# Patient Record
Sex: Female | Born: 1955
Health system: Southern US, Community
[De-identification: ages and names within clinical notes are randomized; demographics above are authoritative.]

## PROBLEM LIST (undated history)

## (undated) DIAGNOSIS — E559 Vitamin D deficiency, unspecified: Secondary | ICD-10-CM

## (undated) DIAGNOSIS — M8580 Other specified disorders of bone density and structure, unspecified site: Secondary | ICD-10-CM

## (undated) DIAGNOSIS — C801 Malignant (primary) neoplasm, unspecified: Secondary | ICD-10-CM

## (undated) DIAGNOSIS — I10 Essential (primary) hypertension: Secondary | ICD-10-CM

## (undated) DIAGNOSIS — D693 Immune thrombocytopenic purpura: Secondary | ICD-10-CM

## (undated) DIAGNOSIS — F419 Anxiety disorder, unspecified: Secondary | ICD-10-CM

## (undated) DIAGNOSIS — D126 Benign neoplasm of colon, unspecified: Secondary | ICD-10-CM

## (undated) HISTORY — DX: Benign neoplasm of colon, unspecified: D12.6

## (undated) HISTORY — PX: TUBAL LIGATION: SHX77

## (undated) HISTORY — DX: Essential (primary) hypertension: I10

## (undated) HISTORY — DX: Malignant (primary) neoplasm, unspecified: C80.1

## (undated) HISTORY — DX: Other specified disorders of bone density and structure, unspecified site: M85.80

## (undated) HISTORY — DX: Immune thrombocytopenic purpura: D69.3

## (undated) HISTORY — PX: APPENDECTOMY: SHX54

## (undated) HISTORY — DX: Anxiety disorder, unspecified: F41.9

## (undated) HISTORY — PX: OTHER SURGICAL HISTORY: SHX169

## (undated) HISTORY — DX: Vitamin D deficiency, unspecified: E55.9

---

## 1995-10-17 HISTORY — PX: ABDOMINAL HYSTERECTOMY: SHX81

## 1995-10-17 HISTORY — PX: TOTAL VAGINAL HYSTERECTOMY: SHX2548

## 1996-10-16 DIAGNOSIS — C801 Malignant (primary) neoplasm, unspecified: Secondary | ICD-10-CM

## 1996-10-16 HISTORY — DX: Malignant (primary) neoplasm, unspecified: C80.1

## 1997-12-07 ENCOUNTER — Ambulatory Visit (HOSPITAL_COMMUNITY): Admission: RE | Admit: 1997-12-07 | Discharge: 1997-12-07 | Payer: Self-pay | Admitting: Obstetrics and Gynecology

## 1998-05-04 ENCOUNTER — Ambulatory Visit (HOSPITAL_COMMUNITY): Admission: RE | Admit: 1998-05-04 | Discharge: 1998-05-04 | Payer: Self-pay | Admitting: Internal Medicine

## 1998-10-18 ENCOUNTER — Other Ambulatory Visit: Admission: RE | Admit: 1998-10-18 | Discharge: 1998-10-18 | Payer: Self-pay | Admitting: Obstetrics and Gynecology

## 1999-01-05 ENCOUNTER — Ambulatory Visit (HOSPITAL_COMMUNITY): Admission: RE | Admit: 1999-01-05 | Discharge: 1999-01-05 | Payer: Self-pay | Admitting: Obstetrics and Gynecology

## 1999-01-05 ENCOUNTER — Encounter: Payer: Self-pay | Admitting: Obstetrics and Gynecology

## 1999-07-14 ENCOUNTER — Ambulatory Visit (HOSPITAL_COMMUNITY): Admission: RE | Admit: 1999-07-14 | Discharge: 1999-07-14 | Payer: Self-pay | Admitting: *Deleted

## 1999-12-30 ENCOUNTER — Other Ambulatory Visit: Admission: RE | Admit: 1999-12-30 | Discharge: 1999-12-30 | Payer: Self-pay | Admitting: Obstetrics and Gynecology

## 2000-01-16 ENCOUNTER — Encounter: Payer: Self-pay | Admitting: Obstetrics and Gynecology

## 2000-01-16 ENCOUNTER — Ambulatory Visit (HOSPITAL_COMMUNITY): Admission: RE | Admit: 2000-01-16 | Discharge: 2000-01-16 | Payer: Self-pay | Admitting: Obstetrics and Gynecology

## 2000-01-25 ENCOUNTER — Other Ambulatory Visit: Admission: RE | Admit: 2000-01-25 | Discharge: 2000-01-25 | Payer: Self-pay | Admitting: Obstetrics and Gynecology

## 2000-05-18 ENCOUNTER — Encounter: Admission: RE | Admit: 2000-05-18 | Discharge: 2000-05-18 | Payer: Self-pay | Admitting: *Deleted

## 2001-01-01 ENCOUNTER — Other Ambulatory Visit: Admission: RE | Admit: 2001-01-01 | Discharge: 2001-01-01 | Payer: Self-pay | Admitting: Obstetrics and Gynecology

## 2001-01-18 ENCOUNTER — Encounter: Payer: Self-pay | Admitting: Obstetrics and Gynecology

## 2001-01-18 ENCOUNTER — Ambulatory Visit (HOSPITAL_COMMUNITY): Admission: RE | Admit: 2001-01-18 | Discharge: 2001-01-18 | Payer: Self-pay | Admitting: Obstetrics and Gynecology

## 2002-09-26 ENCOUNTER — Encounter: Admission: RE | Admit: 2002-09-26 | Discharge: 2002-09-26 | Payer: Self-pay | Admitting: Internal Medicine

## 2002-09-26 ENCOUNTER — Encounter: Payer: Self-pay | Admitting: Internal Medicine

## 2002-11-12 ENCOUNTER — Emergency Department (HOSPITAL_COMMUNITY): Admission: EM | Admit: 2002-11-12 | Discharge: 2002-11-12 | Payer: Self-pay | Admitting: Emergency Medicine

## 2002-11-12 ENCOUNTER — Encounter: Payer: Self-pay | Admitting: Emergency Medicine

## 2002-12-16 ENCOUNTER — Encounter: Payer: Self-pay | Admitting: Obstetrics and Gynecology

## 2002-12-16 ENCOUNTER — Ambulatory Visit (HOSPITAL_COMMUNITY): Admission: RE | Admit: 2002-12-16 | Discharge: 2002-12-16 | Payer: Self-pay | Admitting: Obstetrics and Gynecology

## 2002-12-24 ENCOUNTER — Other Ambulatory Visit: Admission: RE | Admit: 2002-12-24 | Discharge: 2002-12-24 | Payer: Self-pay | Admitting: Obstetrics and Gynecology

## 2003-10-05 ENCOUNTER — Emergency Department (HOSPITAL_COMMUNITY): Admission: EM | Admit: 2003-10-05 | Discharge: 2003-10-05 | Payer: Self-pay | Admitting: Emergency Medicine

## 2003-12-22 ENCOUNTER — Ambulatory Visit (HOSPITAL_COMMUNITY): Admission: RE | Admit: 2003-12-22 | Discharge: 2003-12-22 | Payer: Self-pay | Admitting: Obstetrics and Gynecology

## 2003-12-25 ENCOUNTER — Other Ambulatory Visit: Admission: RE | Admit: 2003-12-25 | Discharge: 2003-12-25 | Payer: Self-pay | Admitting: Obstetrics and Gynecology

## 2004-12-23 ENCOUNTER — Ambulatory Visit (HOSPITAL_COMMUNITY): Admission: RE | Admit: 2004-12-23 | Discharge: 2004-12-23 | Payer: Self-pay | Admitting: Obstetrics and Gynecology

## 2005-01-12 ENCOUNTER — Other Ambulatory Visit: Admission: RE | Admit: 2005-01-12 | Discharge: 2005-01-12 | Payer: Self-pay | Admitting: Addiction Medicine

## 2005-12-29 ENCOUNTER — Ambulatory Visit (HOSPITAL_COMMUNITY): Admission: RE | Admit: 2005-12-29 | Discharge: 2005-12-29 | Payer: Self-pay | Admitting: Obstetrics and Gynecology

## 2006-01-26 ENCOUNTER — Other Ambulatory Visit: Admission: RE | Admit: 2006-01-26 | Discharge: 2006-01-26 | Payer: Self-pay | Admitting: Obstetrics and Gynecology

## 2007-01-08 ENCOUNTER — Ambulatory Visit (HOSPITAL_COMMUNITY): Admission: RE | Admit: 2007-01-08 | Discharge: 2007-01-08 | Payer: Self-pay | Admitting: Obstetrics and Gynecology

## 2007-02-01 ENCOUNTER — Other Ambulatory Visit: Admission: RE | Admit: 2007-02-01 | Discharge: 2007-02-01 | Payer: Self-pay | Admitting: Obstetrics and Gynecology

## 2008-01-17 ENCOUNTER — Ambulatory Visit (HOSPITAL_COMMUNITY): Admission: RE | Admit: 2008-01-17 | Discharge: 2008-01-17 | Payer: Self-pay | Admitting: Obstetrics and Gynecology

## 2008-01-27 ENCOUNTER — Other Ambulatory Visit: Admission: RE | Admit: 2008-01-27 | Discharge: 2008-01-27 | Payer: Self-pay | Admitting: Obstetrics and Gynecology

## 2008-11-16 DIAGNOSIS — D126 Benign neoplasm of colon, unspecified: Secondary | ICD-10-CM

## 2008-11-16 HISTORY — DX: Benign neoplasm of colon, unspecified: D12.6

## 2008-12-03 DIAGNOSIS — D126 Benign neoplasm of colon, unspecified: Secondary | ICD-10-CM | POA: Insufficient documentation

## 2009-02-05 ENCOUNTER — Encounter: Admission: RE | Admit: 2009-02-05 | Discharge: 2009-02-05 | Payer: Self-pay | Admitting: Obstetrics and Gynecology

## 2010-11-26 ENCOUNTER — Emergency Department (HOSPITAL_COMMUNITY)
Admission: EM | Admit: 2010-11-26 | Discharge: 2010-11-26 | Disposition: A | Discharge status: Home / Self Care | Payer: Managed Care, Other (non HMO) | Attending: Emergency Medicine | Admitting: Emergency Medicine

## 2010-11-26 ENCOUNTER — Emergency Department (HOSPITAL_COMMUNITY): Payer: Managed Care, Other (non HMO)

## 2010-11-26 DIAGNOSIS — M549 Dorsalgia, unspecified: Secondary | ICD-10-CM | POA: Insufficient documentation

## 2010-11-26 DIAGNOSIS — I10 Essential (primary) hypertension: Secondary | ICD-10-CM | POA: Insufficient documentation

## 2010-11-26 DIAGNOSIS — Z85820 Personal history of malignant melanoma of skin: Secondary | ICD-10-CM | POA: Insufficient documentation

## 2010-11-26 DIAGNOSIS — R079 Chest pain, unspecified: Secondary | ICD-10-CM | POA: Insufficient documentation

## 2012-09-03 ENCOUNTER — Other Ambulatory Visit: Payer: Self-pay | Admitting: Obstetrics and Gynecology

## 2012-09-03 DIAGNOSIS — Z1231 Encounter for screening mammogram for malignant neoplasm of breast: Secondary | ICD-10-CM

## 2012-09-27 ENCOUNTER — Ambulatory Visit (HOSPITAL_COMMUNITY)
Admission: RE | Admit: 2012-09-27 | Discharge: 2012-09-27 | Disposition: A | Discharge status: Home / Self Care | Payer: Managed Care, Other (non HMO) | Source: Ambulatory Visit | Attending: Obstetrics and Gynecology | Admitting: Obstetrics and Gynecology

## 2012-09-27 DIAGNOSIS — Z1231 Encounter for screening mammogram for malignant neoplasm of breast: Secondary | ICD-10-CM | POA: Insufficient documentation

## 2012-10-01 ENCOUNTER — Encounter: Payer: Self-pay | Admitting: Gynecology

## 2012-10-01 DIAGNOSIS — N809 Endometriosis, unspecified: Secondary | ICD-10-CM | POA: Insufficient documentation

## 2012-10-01 DIAGNOSIS — C4372 Malignant melanoma of left lower limb, including hip: Secondary | ICD-10-CM | POA: Insufficient documentation

## 2012-10-10 ENCOUNTER — Ambulatory Visit (INDEPENDENT_AMBULATORY_CARE_PROVIDER_SITE_OTHER): Payer: Managed Care, Other (non HMO) | Admitting: Obstetrics and Gynecology

## 2012-10-10 ENCOUNTER — Encounter: Payer: Self-pay | Admitting: Obstetrics and Gynecology

## 2012-10-10 VITALS — BP 134/84 | Ht 66.0 in | Wt 199.0 lb

## 2012-10-10 DIAGNOSIS — Z23 Encounter for immunization: Secondary | ICD-10-CM

## 2012-10-10 DIAGNOSIS — M8580 Other specified disorders of bone density and structure, unspecified site: Secondary | ICD-10-CM

## 2012-10-10 DIAGNOSIS — M899 Disorder of bone, unspecified: Secondary | ICD-10-CM

## 2012-10-10 DIAGNOSIS — I10 Essential (primary) hypertension: Secondary | ICD-10-CM | POA: Insufficient documentation

## 2012-10-10 DIAGNOSIS — Z01419 Encounter for gynecological examination (general) (routine) without abnormal findings: Secondary | ICD-10-CM

## 2012-10-10 DIAGNOSIS — M949 Disorder of cartilage, unspecified: Secondary | ICD-10-CM

## 2012-10-10 MED ORDER — HYDROCORTISONE ACETATE 25 MG RE SUPP: 25.0000 mg | Freq: Two times a day (BID) | RECTAL | Status: DC

## 2012-10-10 NOTE — Progress Notes (Signed)
Patient came to see me today for her annual GYN exam. She is a gravida 2 para 2 Ab0. This was a new GYN visit because we had not seen her for 4-1/2 years.  In 1997 she had a total abdominal hysterectomy and appendectomy for fibroids and endometriosis. In 1984 she had cryosurgery for cervical dysplasia which was CIN-1. She has not had abnormal Pap smears since then. Her last Pap smear was 2010. She is not sexually active. She has had trouble with hemorrhoids. She had 2 thrombosed hemorrhoids lanced. She uses Anusol HC Suppository's for discomfort from hemorrhoids. She has been aware for years of something on her left labia. She had a normal mammogram this year. Her last bone density was 2008. She had osteopenia. She did not have an elevated fracture risk. She does her lab through her PCP. She is doing well without hormone replacement therapy. She was previously on Vagifem but is not having intercourse and is okay without it. She is having no vaginal bleeding. She is having no pelvic pain.  HEENT: Within normal limits.Kennon Portela present. Neck: No masses. Supraclavicular lymph nodes: Not enlarged. Breasts: Examined in both sitting and lying position. Symmetrical without skin changes or masses. Abdomen: Soft no masses guarding or rebound. No hernias. Pelvic: External within normal limits Except for varicosity of left labia. BUS within normal limits. Vaginal examination shows poorestrogen effect, no cystocele enterocele or rectocele. Cervix and uterus absent. Adnexa within normal limits. Rectovaginal confirmatory.Hemorrhoidal tag present at 3 o'clock. Extremities within normal limits.  Assessment: #1. Dilated varicose vein of left labia. #2 osteopenia. #3 atrophic vaginitis. #4 hemorrhoids  Plan: Continue colonoscopies. Continue yearly mammograms. Continue Anusol HC-prescription written. Schedule  bone density. Reassured about varicose vein of labia.Pap not done.The new Pap smear guidelines were discussed  with the patient.

## 2012-10-10 NOTE — Patient Instructions (Signed)
Schedule bone density.    

## 2012-10-10 NOTE — Addendum Note (Signed)
Addended by: Dayna Barker on: 10/10/2012 04:00 PM   Modules accepted: Orders

## 2012-10-11 LAB — URINALYSIS W MICROSCOPIC + REFLEX CULTURE
Glucose, UA: NEGATIVE mg/dL
Leukocytes, UA: NEGATIVE
Protein, ur: NEGATIVE mg/dL
Squamous Epithelial / LPF: NONE SEEN
pH: 5.5 (ref 5.0–8.0)

## 2012-10-16 DIAGNOSIS — E559 Vitamin D deficiency, unspecified: Secondary | ICD-10-CM

## 2012-10-16 DIAGNOSIS — M8580 Other specified disorders of bone density and structure, unspecified site: Secondary | ICD-10-CM | POA: Insufficient documentation

## 2012-10-16 HISTORY — DX: Other specified disorders of bone density and structure, unspecified site: M85.80

## 2012-10-16 HISTORY — DX: Vitamin D deficiency, unspecified: E55.9

## 2012-11-12 ENCOUNTER — Other Ambulatory Visit: Payer: Self-pay | Admitting: Gynecology

## 2012-11-12 ENCOUNTER — Encounter: Payer: Self-pay | Admitting: Gynecology

## 2012-11-12 ENCOUNTER — Ambulatory Visit (INDEPENDENT_AMBULATORY_CARE_PROVIDER_SITE_OTHER): Payer: Managed Care, Other (non HMO)

## 2012-11-12 DIAGNOSIS — M949 Disorder of cartilage, unspecified: Secondary | ICD-10-CM

## 2012-11-12 DIAGNOSIS — M899 Disorder of bone, unspecified: Secondary | ICD-10-CM

## 2012-11-12 DIAGNOSIS — M8580 Other specified disorders of bone density and structure, unspecified site: Secondary | ICD-10-CM

## 2012-11-14 ENCOUNTER — Other Ambulatory Visit: Payer: Self-pay | Admitting: *Deleted

## 2012-11-14 DIAGNOSIS — M899 Disorder of bone, unspecified: Secondary | ICD-10-CM

## 2012-11-14 DIAGNOSIS — M949 Disorder of cartilage, unspecified: Secondary | ICD-10-CM

## 2012-11-15 ENCOUNTER — Other Ambulatory Visit: Payer: Managed Care, Other (non HMO)

## 2012-11-15 DIAGNOSIS — M899 Disorder of bone, unspecified: Secondary | ICD-10-CM

## 2012-11-18 ENCOUNTER — Other Ambulatory Visit: Payer: Self-pay | Admitting: Gynecology

## 2012-11-18 ENCOUNTER — Encounter: Payer: Self-pay | Admitting: Gynecology

## 2012-11-18 DIAGNOSIS — E559 Vitamin D deficiency, unspecified: Secondary | ICD-10-CM

## 2012-11-18 LAB — PARATHYROID HORMONE, INTACT (NO CA): PTH: 61 pg/mL (ref 14.0–72.0)

## 2012-11-18 MED ORDER — VITAMIN D3 1.25 MG (50000 UT) PO CAPS: 1.0000 | ORAL_CAPSULE | ORAL | Status: DC

## 2013-02-14 ENCOUNTER — Other Ambulatory Visit: Payer: Managed Care, Other (non HMO)

## 2013-02-14 DIAGNOSIS — E559 Vitamin D deficiency, unspecified: Secondary | ICD-10-CM

## 2013-02-17 ENCOUNTER — Other Ambulatory Visit: Payer: Self-pay | Admitting: Gynecology

## 2013-02-17 DIAGNOSIS — E559 Vitamin D deficiency, unspecified: Secondary | ICD-10-CM

## 2013-02-17 MED ORDER — ERGOCALCIFEROL 1.25 MG (50000 UT) PO CAPS: 50000.0000 [IU] | ORAL_CAPSULE | ORAL | Status: DC

## 2013-03-18 DIAGNOSIS — I10 Essential (primary) hypertension: Secondary | ICD-10-CM | POA: Insufficient documentation

## 2013-04-11 ENCOUNTER — Other Ambulatory Visit: Payer: Self-pay | Admitting: Gynecology

## 2013-04-25 ENCOUNTER — Other Ambulatory Visit: Payer: Managed Care, Other (non HMO)

## 2013-04-25 DIAGNOSIS — E559 Vitamin D deficiency, unspecified: Secondary | ICD-10-CM

## 2013-04-26 LAB — VITAMIN D 25 HYDROXY (VIT D DEFICIENCY, FRACTURES): Vit D, 25-Hydroxy: 28 ng/mL — ABNORMAL LOW (ref 30–89)

## 2013-04-27 ENCOUNTER — Emergency Department (HOSPITAL_COMMUNITY)
Admission: EM | Admit: 2013-04-27 | Discharge: 2013-04-28 | Disposition: A | Discharge status: Home / Self Care | Payer: Managed Care, Other (non HMO) | Attending: Emergency Medicine | Admitting: Emergency Medicine

## 2013-04-27 ENCOUNTER — Emergency Department (HOSPITAL_COMMUNITY): Payer: Managed Care, Other (non HMO)

## 2013-04-27 ENCOUNTER — Encounter (HOSPITAL_COMMUNITY): Payer: Self-pay | Admitting: Emergency Medicine

## 2013-04-27 DIAGNOSIS — Y9389 Activity, other specified: Secondary | ICD-10-CM | POA: Insufficient documentation

## 2013-04-27 DIAGNOSIS — I10 Essential (primary) hypertension: Secondary | ICD-10-CM | POA: Insufficient documentation

## 2013-04-27 DIAGNOSIS — S92402A Displaced unspecified fracture of left great toe, initial encounter for closed fracture: Secondary | ICD-10-CM

## 2013-04-27 DIAGNOSIS — R209 Unspecified disturbances of skin sensation: Secondary | ICD-10-CM | POA: Insufficient documentation

## 2013-04-27 DIAGNOSIS — S92919A Unspecified fracture of unspecified toe(s), initial encounter for closed fracture: Secondary | ICD-10-CM | POA: Insufficient documentation

## 2013-04-27 DIAGNOSIS — Z8639 Personal history of other endocrine, nutritional and metabolic disease: Secondary | ICD-10-CM | POA: Insufficient documentation

## 2013-04-27 DIAGNOSIS — Z8742 Personal history of other diseases of the female genital tract: Secondary | ICD-10-CM | POA: Insufficient documentation

## 2013-04-27 DIAGNOSIS — Z862 Personal history of diseases of the blood and blood-forming organs and certain disorders involving the immune mechanism: Secondary | ICD-10-CM | POA: Insufficient documentation

## 2013-04-27 DIAGNOSIS — Z8739 Personal history of other diseases of the musculoskeletal system and connective tissue: Secondary | ICD-10-CM | POA: Insufficient documentation

## 2013-04-27 DIAGNOSIS — Z85828 Personal history of other malignant neoplasm of skin: Secondary | ICD-10-CM | POA: Insufficient documentation

## 2013-04-27 DIAGNOSIS — Y929 Unspecified place or not applicable: Secondary | ICD-10-CM | POA: Insufficient documentation

## 2013-04-27 DIAGNOSIS — W208XXA Other cause of strike by thrown, projected or falling object, initial encounter: Secondary | ICD-10-CM | POA: Insufficient documentation

## 2013-04-27 DIAGNOSIS — Z79899 Other long term (current) drug therapy: Secondary | ICD-10-CM | POA: Insufficient documentation

## 2013-04-27 MED ORDER — HYDROCODONE-ACETAMINOPHEN 5-325 MG PO TABS: 1.0000 | ORAL_TABLET | ORAL | Status: DC | PRN

## 2013-04-27 MED ORDER — IBUPROFEN 800 MG PO TABS
800.0000 mg | ORAL_TABLET | Freq: Once | ORAL | Status: AC
  Administered 2013-04-27: 800 mg via ORAL
  Filled 2013-04-27: qty 1

## 2013-04-27 MED ORDER — IBUPROFEN 800 MG PO TABS: 800.0000 mg | ORAL_TABLET | Freq: Three times a day (TID) | ORAL | Status: DC

## 2013-04-27 NOTE — ED Notes (Signed)
Pt states that she was disassembling a crib and a large piece fell on her left great toe. Toe is bruised and swollen.

## 2013-04-27 NOTE — ED Provider Notes (Signed)
History  This chart was scribed for non-physician practitioner working with Gerhard Munch, MD by Greggory Stallion, ED scribe. This patient was seen in room WTR9/WTR9 and the patient's care was started at 10:19 PM.  CSN: [id number] Arrival date & time 04/27/13  2026   Chief Complaint  Patient presents with  . Toe Injury   The history is provided by medical records and the patient. No language interpreter was used.    HPI Comments: Carol Thomas is a 57 y.o. female who presents to the Emergency Department complaining of left 1st toe injury that happened about 6 hours ago. Pt states she was disassembling a crib and a large piece fell on her toe. She states the pain is worsened by touch or bearing weight. Pt states she has some numbness around her toe. Pt states she took ibuprofen with some relief, but has not tried other over-the-counter treatments. Pt denies any other associated symptoms including weakness. Patient has not attempted to ambulate since the injury.  Past Medical History  Diagnosis Date  . Cancer 1998    Melanoma-Left leg  . Fibroid   . Endometriosis   . Hypertension   . Osteopenia 10/2012    T score of -2.2 FRAX 8%/1.1%  . Vitamin D deficiency 10/2012    vitamin D level 10   Past Surgical History  Procedure Laterality Date  . Abdominal hysterectomy  1997    TAH  . Tubal ligation    . Appendectomy    . Melanoma excised     Family History  Problem Relation Age of Onset  . Hypertension Mother   . Diabetes Mother   . Osteoporosis Mother   . Thyroid disease Mother    History  Substance Use Topics  . Smoking status: Never Smoker   . Smokeless tobacco: Never Used  . Alcohol Use: No   OB History   Grav Para Term Preterm Abortions TAB SAB Ect Mult Living   2 2 2       2      Review of Systems  HENT: Negative for neck pain and neck stiffness.   Musculoskeletal: Positive for joint swelling and arthralgias. Negative for back pain.  Skin: Negative for wound.   Neurological: Positive for numbness.  All other systems reviewed and are negative.    Allergies  Review of patient's allergies indicates no known allergies.  Home Medications   Current Outpatient Rx  Name  Route  Sig  Dispense  Refill  . amLODipine (NORVASC) 10 MG tablet   Oral   Take 10 mg by mouth daily.         . citalopram (CELEXA) 40 MG tablet   Oral   Take 20 mg by mouth daily.          . hydrocortisone (ANUSOL-HC) 25 MG suppository   Rectal   Place 25 mg rectally 2 (two) times daily as needed for hemorrhoids.         Marland Kitchen ibuprofen (ADVIL,MOTRIN) 200 MG tablet   Oral   Take 600-800 mg by mouth every 6 (six) hours as needed for pain.         Marland Kitchen losartan-hydrochlorothiazide (HYZAAR) 100-25 MG per tablet   Oral   Take 1 tablet by mouth daily.         Marland Kitchen HYDROcodone-acetaminophen (NORCO/VICODIN) 5-325 MG per tablet   Oral   Take 1 tablet by mouth every 4 (four) hours as needed for pain.   6 tablet   0   .  ibuprofen (ADVIL,MOTRIN) 800 MG tablet   Oral   Take 1 tablet (800 mg total) by mouth 3 (three) times daily.   21 tablet   0    BP 163/92  Pulse 62  Temp(Src) 98.3 F (36.8 C) (Oral)  Resp 18  Ht 5\' 6"  (1.676 m)  Wt 203 lb (92.08 kg)  BMI 32.78 kg/m2  SpO2 95%  Physical Exam  Nursing note and vitals reviewed. Constitutional: She appears well-developed and well-nourished. No distress.  Awake, alert, nontoxic appearance  HENT:  Head: Normocephalic and atraumatic.  Mouth/Throat: Oropharynx is clear and moist. No oropharyngeal exudate.  Eyes: Conjunctivae are normal. Pupils are equal, round, and reactive to light. No scleral icterus.  Neck: Normal range of motion. Neck supple.  Cardiovascular: Normal rate, regular rhythm, normal heart sounds and intact distal pulses.   No murmur heard. Capillary refill less than 3 the left lower extremity  Pulmonary/Chest: Effort normal and breath sounds normal. No respiratory distress. She has no wheezes.   Musculoskeletal: Normal range of motion. She exhibits no edema.  Swelling and ecchymosis to left great toe. Subungual hematoma noted along with onychomycosis. Left great toe is Tender to palpation with Decreased ROM. Full ROM of left ankle and left knee.   Neurological: She is alert.  Speech is clear and goal oriented Moves extremities without ataxia Sensation intact to the left lower extremity including the left great toe Strength 5 out of 5 the left lower extremity including plantar flexion and dorsiflexion, decreased strength in the left great toe secondary to severe pain   Skin: Skin is warm and dry. She is not diaphoretic.  Psychiatric: She has a normal mood and affect.    ED Course  Procedures (including critical care time)  DIAGNOSTIC STUDIES: Oxygen Saturation is 95% on RA, adequate by my interpretation.    COORDINATION OF CARE: 11:26 PM-Discussed treatment plan which includes buddy taping, post-op shoe, and crutches with pt at bedside and pt agreed to plan. Advised pt to follow up with orthopaedics.    Labs Reviewed - No data to display Dg Toe Great Left  04/27/2013   *RADIOLOGY REPORT*  Clinical Data: Trauma.  Pain.  LEFT GREAT TOE  Comparison: None.  Findings: Oblique slightly displaced fracture of the left first toe distal phalanx.  IMPRESSION: Oblique slightly displaced fracture of the left first toe distal phalanx.   Original Report Authenticated By: Lacy Duverney, M.D.   1. Fractured great toe, left, closed, initial encounter     MDM  Carol Thomas presents with injury to the left great toe.  Patient X-Ray with oblique, slightly displaced fracture of the distal phalanx of the left first toe. I personally reviewed the imaging tests through PACS system.  I reviewed available ER/hospitalization records through the EMR.  Buddy tape placed and patient placed in post-op shoe. She ambulates without difficulty in a postop shoe. Pain managed in ED. Pt advised to follow up with  orthopedics for further evaluation as needed. Conservative therapy recommended and discussed. Patient will be dc home & is agreeable with above plan.  I have also discussed reasons to return immediately to the ER.  Patient expresses understanding and agrees with plan.  I personally performed the services described in this documentation, which was scribed in my presence. The recorded information has been reviewed and is accurate.    Dahlia Client Tziporah Knoke, PA-C 04/28/13 [phone]

## 2013-04-29 ENCOUNTER — Other Ambulatory Visit: Payer: Self-pay | Admitting: Gynecology

## 2013-04-29 DIAGNOSIS — E559 Vitamin D deficiency, unspecified: Secondary | ICD-10-CM

## 2013-04-30 NOTE — ED Provider Notes (Signed)
  Medical screening examination/treatment/procedure(s) were performed by non-physician practitioner and as supervising physician I was immediately available for consultation/collaboration.    Alyx Gee, MD 04/30/13 1114 

## 2013-06-13 ENCOUNTER — Other Ambulatory Visit: Payer: Self-pay | Admitting: Gynecology

## 2013-08-16 ENCOUNTER — Other Ambulatory Visit: Payer: Self-pay | Admitting: Gynecology

## 2013-08-18 NOTE — Telephone Encounter (Signed)
rx denied because pt is due to have Vit. D rechecked.

## 2013-08-21 ENCOUNTER — Other Ambulatory Visit: Payer: Self-pay

## 2014-01-08 ENCOUNTER — Telehealth: Payer: Self-pay | Admitting: *Deleted

## 2014-01-08 MED ORDER — HYDROCORTISONE ACETATE 25 MG RE SUPP: 25.0000 mg | Freq: Two times a day (BID) | RECTAL | Status: DC | PRN

## 2014-01-08 NOTE — Telephone Encounter (Signed)
rx sent

## 2014-01-08 NOTE — Telephone Encounter (Signed)
Pt has annual scheduled with you on 02/27/14, requesting refill on Anusol-HC supporsitory due to painful hemorrhoids. Last seen in Dec.2013 please advise

## 2014-01-08 NOTE — Telephone Encounter (Signed)
Okay to refill? 

## 2014-02-27 ENCOUNTER — Ambulatory Visit (INDEPENDENT_AMBULATORY_CARE_PROVIDER_SITE_OTHER): Payer: Managed Care, Other (non HMO) | Admitting: Gynecology

## 2014-02-27 ENCOUNTER — Other Ambulatory Visit (HOSPITAL_COMMUNITY)
Admission: RE | Admit: 2014-02-27 | Discharge: 2014-02-27 | Disposition: A | Discharge status: Home / Self Care | Payer: Managed Care, Other (non HMO) | Source: Ambulatory Visit | Attending: Gynecology | Admitting: Gynecology

## 2014-02-27 ENCOUNTER — Encounter: Payer: Self-pay | Admitting: Gynecology

## 2014-02-27 VITALS — BP 124/80 | Ht 66.0 in | Wt 202.0 lb

## 2014-02-27 DIAGNOSIS — M949 Disorder of cartilage, unspecified: Secondary | ICD-10-CM

## 2014-02-27 DIAGNOSIS — Z01419 Encounter for gynecological examination (general) (routine) without abnormal findings: Secondary | ICD-10-CM

## 2014-02-27 DIAGNOSIS — K649 Unspecified hemorrhoids: Secondary | ICD-10-CM

## 2014-02-27 DIAGNOSIS — M899 Disorder of bone, unspecified: Secondary | ICD-10-CM

## 2014-02-27 DIAGNOSIS — M8580 Other specified disorders of bone density and structure, unspecified site: Secondary | ICD-10-CM

## 2014-02-27 MED ORDER — HYDROCORTISONE ACETATE 25 MG RE SUPP: 25.0000 mg | Freq: Two times a day (BID) | RECTAL | Status: DC | PRN

## 2014-02-27 NOTE — Patient Instructions (Signed)
Followup for annual exam in one year, sooner if any issues.  You may obtain a copy of any labs that were done today by logging onto MyChart as outlined in the instructions provided with your AVS (after visit summary). The office will not call with normal lab results but certainly if there are any significant abnormalities then we will contact you.   Health Maintenance, Female A healthy lifestyle and preventative care can promote health and wellness.  Maintain regular health, dental, and eye exams.  Eat a healthy diet. Foods like vegetables, fruits, whole grains, low-fat dairy products, and lean protein foods contain the nutrients you need without too many calories. Decrease your intake of foods high in solid fats, added sugars, and salt. Get information about a proper diet from your caregiver, if necessary.  Regular physical exercise is one of the most important things you can do for your health. Most adults should get at least 150 minutes of moderate-intensity exercise (any activity that increases your heart rate and causes you to sweat) each week. In addition, most adults need muscle-strengthening exercises on 2 or more days a week.   Maintain a healthy weight. The body mass index (BMI) is a screening tool to identify possible weight problems. It provides an estimate of body fat based on height and weight. Your caregiver can help determine your BMI, and can help you achieve or maintain a healthy weight. For adults 20 years and older:  A BMI below 18.5 is considered underweight.  A BMI of 18.5 to 24.9 is normal.  A BMI of 25 to 29.9 is considered overweight.  A BMI of 30 and above is considered obese.  Maintain normal blood lipids and cholesterol by exercising and minimizing your intake of saturated fat. Eat a balanced diet with plenty of fruits and vegetables. Blood tests for lipids and cholesterol should begin at age 15 and be repeated every 5 years. If your lipid or cholesterol levels are  high, you are over 50, or you are a high risk for heart disease, you may need your cholesterol levels checked more frequently.Ongoing high lipid and cholesterol levels should be treated with medicines if diet and exercise are not effective.  If you smoke, find out from your caregiver how to quit. If you do not use tobacco, do not start.  Lung cancer screening is recommended for adults aged 45 80 years who are at high risk for developing lung cancer because of a history of smoking. Yearly low-dose computed tomography (CT) is recommended for people who have at least a 30-pack-year history of smoking and are a current smoker or have quit within the past 15 years. A pack year of smoking is smoking an average of 1 pack of cigarettes a day for 1 year (for example: 1 pack a day for 30 years or 2 packs a day for 15 years). Yearly screening should continue until the smoker has stopped smoking for at least 15 years. Yearly screening should also be stopped for people who develop a health problem that would prevent them from having lung cancer treatment.  If you are pregnant, do not drink alcohol. If you are breastfeeding, be very cautious about drinking alcohol. If you are not pregnant and choose to drink alcohol, do not exceed 1 drink per day. One drink is considered to be 12 ounces (355 mL) of beer, 5 ounces (148 mL) of wine, or 1.5 ounces (44 mL) of liquor.  Avoid use of street drugs. Do not share needles with  anyone. Ask for help if you need support or instructions about stopping the use of drugs.  High blood pressure causes heart disease and increases the risk of stroke. Blood pressure should be checked at least every 1 to 2 years. Ongoing high blood pressure should be treated with medicines, if weight loss and exercise are not effective.  If you are 55 to 57 years old, ask your caregiver if you should take aspirin to prevent strokes.  Diabetes screening involves taking a blood sample to check your fasting  blood sugar level. This should be done once every 3 years, after age 45, if you are within normal weight and without risk factors for diabetes. Testing should be considered at a younger age or be carried out more frequently if you are overweight and have at least 1 risk factor for diabetes.  Breast cancer screening is essential preventative care for women. You should practice "breast self-awareness." This means understanding the normal appearance and feel of your breasts and may include breast self-examination. Any changes detected, no matter how small, should be reported to a caregiver. Women in their 20s and 30s should have a clinical breast exam (CBE) by a caregiver as part of a regular health exam every 1 to 3 years. After age 40, women should have a CBE every year. Starting at age 40, women should consider having a mammogram (breast X-ray) every year. Women who have a family history of breast cancer should talk to their caregiver about genetic screening. Women at a high risk of breast cancer should talk to their caregiver about having an MRI and a mammogram every year.  Breast cancer gene (BRCA)-related cancer risk assessment is recommended for women who have family members with BRCA-related cancers. BRCA-related cancers include breast, ovarian, tubal, and peritoneal cancers. Having family members with these cancers may be associated with an increased risk for harmful changes (mutations) in the breast cancer genes BRCA1 and BRCA2. Results of the assessment will determine the need for genetic counseling and BRCA1 and BRCA2 testing.  The Pap test is a screening test for cervical cancer. Women should have a Pap test starting at age 21. Between ages 21 and 29, Pap tests should be repeated every 2 years. Beginning at age 30, you should have a Pap test every 3 years as long as the past 3 Pap tests have been normal. If you had a hysterectomy for a problem that was not cancer or a condition that could lead to  cancer, then you no longer need Pap tests. If you are between ages 65 and 70, and you have had normal Pap tests going back 10 years, you no longer need Pap tests. If you have had past treatment for cervical cancer or a condition that could lead to cancer, you need Pap tests and screening for cancer for at least 20 years after your treatment. If Pap tests have been discontinued, risk factors (such as a new sexual partner) need to be reassessed to determine if screening should be resumed. Some women have medical problems that increase the chance of getting cervical cancer. In these cases, your caregiver may recommend more frequent screening and Pap tests.  The human papillomavirus (HPV) test is an additional test that may be used for cervical cancer screening. The HPV test looks for the virus that can cause the cell changes on the cervix. The cells collected during the Pap test can be tested for HPV. The HPV test could be used to screen women aged 30   years and older, and should be used in women of any age who have unclear Pap test results. After the age of 30, women should have HPV testing at the same frequency as a Pap test.  Colorectal cancer can be detected and often prevented. Most routine colorectal cancer screening begins at the age of 50 and continues through age 75. However, your caregiver may recommend screening at an earlier age if you have risk factors for colon cancer. On a yearly basis, your caregiver may provide home test kits to check for hidden blood in the stool. Use of a small camera at the end of a tube, to directly examine the colon (sigmoidoscopy or colonoscopy), can detect the earliest forms of colorectal cancer. Talk to your caregiver about this at age 50, when routine screening begins. Direct examination of the colon should be repeated every 5 to 10 years through age 75, unless early forms of pre-cancerous polyps or small growths are found.  Hepatitis C blood testing is recommended for  all people born from 1945 through 1965 and any individual with known risks for hepatitis C.  Practice safe sex. Use condoms and avoid high-risk sexual practices to reduce the spread of sexually transmitted infections (STIs). Sexually active women aged 25 and younger should be checked for Chlamydia, which is a common sexually transmitted infection. Older women with new or multiple partners should also be tested for Chlamydia. Testing for other STIs is recommended if you are sexually active and at increased risk.  Osteoporosis is a disease in which the bones lose minerals and strength with aging. This can result in serious bone fractures. The risk of osteoporosis can be identified using a bone density scan. Women ages 65 and over and women at risk for fractures or osteoporosis should discuss screening with their caregivers. Ask your caregiver whether you should be taking a calcium supplement or vitamin D to reduce the rate of osteoporosis.  Menopause can be associated with physical symptoms and risks. Hormone replacement therapy is available to decrease symptoms and risks. You should talk to your caregiver about whether hormone replacement therapy is right for you.  Use sunscreen. Apply sunscreen liberally and repeatedly throughout the day. You should seek shade when your shadow is shorter than you. Protect yourself by wearing long sleeves, pants, a wide-brimmed hat, and sunglasses year round, whenever you are outdoors.  Notify your caregiver of new moles or changes in moles, especially if there is a change in shape or color. Also notify your caregiver if a mole is larger than the size of a pencil eraser.  Stay current with your immunizations. Document Released: 04/17/2011 Document Revised: 01/27/2013 Document Reviewed: 04/17/2011 [employer] Patient Information 2014 [employer].   

## 2014-02-27 NOTE — Progress Notes (Signed)
Carol Thomas 30-Sep-1956 [id number]        58 y.o.  G2P2002 for annual exam.  Doing well. Former patient of Dr. Cherylann Banas.  Past medical history,surgical history, problem list, medications, allergies, family history and social history were all reviewed and documented as reviewed in the EPIC chart.  ROS:  12 system ROS performed with pertinent positives and negatives included in the history, assessment and plan.  Included Systems: General, HEENT, Neck, Cardiovascular, Pulmonary, Gastrointestinal, Genitourinary, Musculoskeletal, Dermatologic, Endocrine, Hematological, Neurologic, Psychiatric Additional significant findings :  None   Exam: Kim assistant Filed Vitals:   02/27/14 1429  BP: 124/80  Height: 5\' 6"  (1.676 m)  Weight: 202 lb (91.627 kg)   General appearance:  Normal affect, orientation and appearance. Skin: Grossly normal HEENT: Without gross lesions.  No cervical or supraclavicular adenopathy. Thyroid normal.  Lungs:  Clear without wheezing, rales or rhonchi Cardiac: RR, without RMG Abdominal:  Soft, nontender, without masses, guarding, rebound, organomegaly or hernia Breasts:  Examined lying and sitting without masses, retractions, discharge or axillary adenopathy. Pelvic:  Ext/BUS/vagina with generalized atrophic changes  Adnexa  Without masses or tenderness    Anus and perineum  Normal with old external hemorrhoids  Rectovaginal  Normal sphincter tone without palpated masses or tenderness.    Assessment/Plan:  58 y.o. G64P2002 female for annual exam.   1. Status post TAH/appendectomy for leiomyoma and endometriosis 1997. Postmenopausal/atrophic genital changes. Patient doing well without significant hot flushes, night sweats, vaginal dryness, is not sexually active. Continue to monitor. 2. Pap smear 2009. Pap of cuff done today. History of CIN-1 status post cryosurgery 1984. Options to stop screaming altogether she is status post hysterectomy for benign indications versus  less frequent screening intervals reviewed. We'll readdress on an annual basis. 3. Mammography overdue and patient knows to schedule this. SBE monthly reviewed. 4. Osteopenia. Next a 10/2012 with T score -2.2 FRAX 8%/1.1%. Was noted to be vitamin D deficient with vitamin D level 10.  Was supplemented with 50,000 units weekly with followup vitamin D 28. Patient never continued supplementation afterwards. Check vitamin D today. Plan repeat DEXA next year at a two-year interval. 5. Colonoscopy 5 years ago and due to be repeated now she is in the process of scheduling this. 6. Hemorrhoids. Patient asked for refill of Anusol HC suppositories that she uses for intermittent hemorrhoid discomfort. Prescription written #12 with one refill 7. Health maintenance. No routine blood work done as she is going to have this done at her primary physician's office who she is in the process of setting up an appointment. Followup one year, sooner as needed.   Note: This document was prepared with digital dictation and possible smart phrase technology. Any transcriptional errors that result from this process are unintentional.   Anastasio Auerbach MD, 3:06 PM 02/27/2014

## 2014-02-28 LAB — URINALYSIS W MICROSCOPIC + REFLEX CULTURE
BACTERIA UA: NONE SEEN
BILIRUBIN URINE: NEGATIVE
CASTS: NONE SEEN
CRYSTALS: NONE SEEN
Glucose, UA: NEGATIVE mg/dL
Hgb urine dipstick: NEGATIVE
KETONES UR: NEGATIVE mg/dL
Leukocytes, UA: NEGATIVE
NITRITE: NEGATIVE
PH: 6 (ref 5.0–8.0)
Protein, ur: NEGATIVE mg/dL
SPECIFIC GRAVITY, URINE: 1.01 (ref 1.005–1.030)
Squamous Epithelial / LPF: NONE SEEN
Urobilinogen, UA: 0.2 mg/dL (ref 0.0–1.0)

## 2014-02-28 LAB — VITAMIN D 25 HYDROXY (VIT D DEFICIENCY, FRACTURES): Vit D, 25-Hydroxy: 21 ng/mL — ABNORMAL LOW (ref 30–89)

## 2014-03-03 ENCOUNTER — Other Ambulatory Visit: Payer: Self-pay | Admitting: Gynecology

## 2014-03-03 DIAGNOSIS — E559 Vitamin D deficiency, unspecified: Secondary | ICD-10-CM

## 2014-03-03 MED ORDER — VITAMIN D (ERGOCALCIFEROL) 1.25 MG (50000 UNIT) PO CAPS: 50000.0000 [IU] | ORAL_CAPSULE | ORAL | Status: DC

## 2014-05-29 ENCOUNTER — Telehealth: Payer: Self-pay | Admitting: *Deleted

## 2014-05-29 NOTE — Telephone Encounter (Signed)
(  pt aware you are out of the office) Pt called requesting refill on Celexa 20 mg pt PCP has retired. Pt said she spoke with you about this. Please advise

## 2014-05-31 NOTE — Telephone Encounter (Signed)
Ok to refill times one year

## 2014-06-01 ENCOUNTER — Other Ambulatory Visit: Payer: Self-pay | Admitting: Gynecology

## 2014-06-01 MED ORDER — CITALOPRAM HYDROBROMIDE 40 MG PO TABS: 20.0000 mg | ORAL_TABLET | Freq: Every day | ORAL | Status: DC

## 2014-06-01 NOTE — Telephone Encounter (Signed)
Rx sent. Patient informed. 

## 2014-08-17 ENCOUNTER — Encounter: Payer: Self-pay | Admitting: Gynecology

## 2014-08-19 ENCOUNTER — Encounter: Payer: Self-pay | Admitting: Family Medicine

## 2014-08-19 ENCOUNTER — Ambulatory Visit (INDEPENDENT_AMBULATORY_CARE_PROVIDER_SITE_OTHER): Payer: 59 | Admitting: Family Medicine

## 2014-08-19 VITALS — BP 138/82 | HR 68 | Ht 66.5 in | Wt 204.0 lb

## 2014-08-19 DIAGNOSIS — F419 Anxiety disorder, unspecified: Secondary | ICD-10-CM | POA: Insufficient documentation

## 2014-08-19 DIAGNOSIS — Z23 Encounter for immunization: Secondary | ICD-10-CM

## 2014-08-19 DIAGNOSIS — Z5181 Encounter for therapeutic drug level monitoring: Secondary | ICD-10-CM

## 2014-08-19 DIAGNOSIS — I10 Essential (primary) hypertension: Secondary | ICD-10-CM | POA: Diagnosis not present

## 2014-08-19 DIAGNOSIS — E782 Mixed hyperlipidemia: Secondary | ICD-10-CM | POA: Insufficient documentation

## 2014-08-19 DIAGNOSIS — E7800 Pure hypercholesterolemia, unspecified: Secondary | ICD-10-CM

## 2014-08-19 DIAGNOSIS — E559 Vitamin D deficiency, unspecified: Secondary | ICD-10-CM | POA: Diagnosis not present

## 2014-08-19 DIAGNOSIS — C439 Malignant melanoma of skin, unspecified: Secondary | ICD-10-CM | POA: Insufficient documentation

## 2014-08-19 DIAGNOSIS — M8580 Other specified disorders of bone density and structure, unspecified site: Secondary | ICD-10-CM | POA: Diagnosis not present

## 2014-08-19 DIAGNOSIS — J309 Allergic rhinitis, unspecified: Secondary | ICD-10-CM | POA: Insufficient documentation

## 2014-08-19 DIAGNOSIS — D126 Benign neoplasm of colon, unspecified: Secondary | ICD-10-CM

## 2014-08-19 DIAGNOSIS — K644 Residual hemorrhoidal skin tags: Secondary | ICD-10-CM | POA: Insufficient documentation

## 2014-08-19 DIAGNOSIS — E785 Hyperlipidemia, unspecified: Secondary | ICD-10-CM | POA: Insufficient documentation

## 2014-08-19 DIAGNOSIS — K319 Disease of stomach and duodenum, unspecified: Secondary | ICD-10-CM | POA: Insufficient documentation

## 2014-08-19 MED ORDER — LOSARTAN POTASSIUM-HCTZ 100-25 MG PO TABS: 1.0000 | ORAL_TABLET | Freq: Every day | ORAL | Status: DC

## 2014-08-19 MED ORDER — AMLODIPINE BESYLATE 10 MG PO TABS: 10.0000 mg | ORAL_TABLET | Freq: Every day | ORAL | Status: DC

## 2014-08-19 NOTE — Progress Notes (Signed)
Subjective:     Patient ID: Carol Thomas, female   DOB: 1956-01-05, 58 y.o.   MRN: [id number]  Carol Thomas presents for Anxiety and Hypertension  Chief Complaint  Patient presents with  . Anxiety    new patient to establish care and med check.   . Hypertension   Patient presents to establish care.  She needs refills on her medications.  Hypertension:  She has been on medication for "quite a few years".  She was on amlodipine for over 10 years;  Losartan HCTZ was added a few years ago because BP wasn't at goal, and she was having some edema.  Denies headaches, dizziness, no side effects to the medication.  Denies swelling, muscle cramps (rare).  Denies chest pain, palpitations.  She is having a sharp pain at the left chest--started after hiccuping funny, thinks she pulled something.  No exertional chest pain or shortness of breath.  [facility] checks [facility] at [facility], and runs 128/70's.  Last labs were at [facility].  Per Care Everywhere:  03/18/2013: Chol 240, HDL 68, LDL 153, TG 93; c-met was normal  Anxiety:  She has been on citalopram for a couple of years; started when her BP's were running high, felt to be partly related to anxiety.  She has never been on 49m dose; she currently has the 45mtablet simply because 2043masn't available at the time of her last refill. Moods are good, anxiety is controlled.  (was bad when her son with bipolar was living with her, having a lot of stress). Citalopram was last filled 10/23, with 9 refills; no refill needed today.  Her son's moods have been well controlled, and anxiety is improved. She currently denies any depressive symptoms.  Vitamin D deficiency: took 3 months of prescription vitamin D per Dr. FonPhineas Real May.  She didn't return for recheck.  She hasn't been taking any OTC Vitamin D since completing the prescription.  Past Medical History  Diagnosis Date  . Cancer 1998    Melanoma-Left leg  . Hypertension   . Osteopenia 10/2012    T score of  -2.2 FRAX 8%/1.1%  . Vitamin D deficiency 10/2012    vitamin D level 10  . Anxiety    Past Surgical History  Procedure Laterality Date  . Abdominal hysterectomy  1997    TAH  . Tubal ligation    . Appendectomy    . Melanoma excised     History   Social History  . Marital Status: Married    Spouse Name: N/A    Number of Children: 2  . Years of Education: N/A   Occupational History  . [employer] banker, operational  Social History Main Topics  . Smoking status: Never Smoker   . Smokeless tobacco: Never Used  . Alcohol Use: No  . Drug Use: No  . Sexual Activity: No   Other Topics Concern  . Not on file   Social History Narrative   Married.  Lives with husband, 2 dogs, 2 cats    Outpatient Encounter Prescriptions as of 08/19/2014  Medication Sig  . amLODipine (NORVASC) 10 MG tablet Take 10 mg by mouth daily.  . citalopram (CELEXA) 40 MG tablet Take 0.5 tablets (20 mg total) by mouth daily.  . lMarland Kitchensartan-hydrochlorothiazide (HYZAAR) 100-25 MG per tablet Take 1 tablet by mouth daily.  . fluticasone (FLONASE) 50 MCG/ACT nasal spray 2 sprays by Each Nare route daily.  . hydrocortisone (ANUSOL-HC) 25 MG suppository Place 1  suppository (25 mg total) rectally 2 (two) times daily as needed for hemorrhoids.  Marland Kitchen ibuprofen (ADVIL,MOTRIN) 200 MG tablet Take 600-800 mg by mouth every 6 (six) hours as needed for pain.  . [DISCONTINUED] Vitamin D, Ergocalciferol, (DRISDOL) 50000 UNITS CAPS capsule Take 1 capsule (50,000 Units total) by mouth every 7 (seven) days.   No Known Allergies  Review of Systems  Constitutional: Negative.  Negative for fever, fatigue and unexpected weight change.  HENT: Negative.   Eyes: Negative.   Respiratory: Negative.  Negative for cough, chest tightness and shortness of breath.   Cardiovascular: Negative for palpitations and leg swelling.       No exertional chest pain--just slight right-sided chest pain where she "pulled something"   Gastrointestinal: Negative.  Negative for nausea, vomiting, abdominal pain, constipation and blood in stool.       Hemorrhoids are not currently flaring.  Endocrine: Negative.  Negative for cold intolerance, heat intolerance and polyuria.  Genitourinary: Negative.  Negative for frequency, hematuria, vaginal bleeding, vaginal discharge and pelvic pain.  Musculoskeletal: Negative.   Skin: Negative.   Allergic/Immunologic: Negative.   Neurological: Negative.  Negative for dizziness, tremors, syncope, weakness, light-headedness, numbness and headaches.  Hematological: Negative.   Psychiatric/Behavioral: Negative.        Objective:     BP 138/82 mmHg  Pulse 68  Ht 5' 6.5" (1.689 m)  Wt 204 lb (92.534 kg)  BMI 32.44 kg/m2 144/90 on repeat by MD  Physical Exam  Constitutional: She is oriented to person, place, and time. She appears well-developed and well-nourished. No distress.  HENT:  Head: Normocephalic and atraumatic.  Right Ear: External ear normal.  Left Ear: External ear normal.  Nose: Nose normal.  Mouth/Throat: Oropharynx is clear and moist.  Upper dentures, lower partials  Eyes: Conjunctivae and EOM are normal. Pupils are equal, round, and reactive to light. No scleral icterus.  Neck: Normal range of motion. Neck supple. No thyromegaly present.  Cardiovascular: Normal rate, regular rhythm, normal heart sounds and intact distal pulses.  Exam reveals no gallop and no friction rub.   No murmur heard. Pulmonary/Chest: Effort normal and breath sounds normal. No respiratory distress. She has no wheezes. She has no rales.  Abdominal: Soft. Bowel sounds are normal. She exhibits no distension and no mass. There is no tenderness. There is no guarding.  Musculoskeletal: She exhibits no edema or tenderness.  Lymphadenopathy:    She has no cervical adenopathy.  Neurological: She is alert and oriented to person, place, and time. She has normal reflexes. No cranial nerve deficit.  Coordination normal.  Skin: Skin is warm and dry. No rash noted.  Psychiatric: She has a normal mood and affect. Her behavior is normal. Judgment and thought content normal.  Nursing note and vitals reviewed.       Assessment and Plan        Essential hypertension - well controlled per numbers elsewhere; borderline today.  Encouraged daily exercise, low sodium diet.  continue current meds, monitoring BP - Plan: TSH, Comprehensive metabolic panel, amLODipine (NORVASC) 10 MG tablet, losartan-hydrochlorothiazide (HYZAAR) 100-25 MG per tablet  Need for prophylactic vaccination and inoculation against influenza - Plan: Flu Vaccine QUAD 36+ mos PF IM (Fluarix Quad PF)  Osteopenia - reviewed sources of calcium (dietary, supplements), recommendations, vitamin D and weight-bearing exercise  Tubular adenoma of colon - past due for 5 year recheck. Advised to contact [facility] and schedule  Medication monitoring encounter - Plan: CBC with Differential, Lipid panel, Comprehensive  metabolic panel  Vitamin D deficiency - counseled re: need for longterm supplementation. 2000 IU daily; will advise if repeat rx treatment is needed. - Plan: Vit D  25 hydroxy (rtn osteoporosis monitoring)  Pure hypercholesterolemia - goals reviewed.  low cholesterol diet reviewed. - Plan: Lipid panel  Anxiety disorder, unspecified anxiety disorder type - well controlled on citalopram; continue  Return later this week for fasting labs  CBC, TSH, c-met, lipid, Vit D Copies to Dr. Phineas Real as Juluis Rainier   Counseled re: calcium and dietary sources Low cholesterol diet exercise

## 2014-08-19 NOTE — Patient Instructions (Signed)
Continue your current medications.  Return fasting for bloodwork. Start taking Vitamin D 2000 IU daily (we will let you know if you need additional prescription treatment, but the 2000 IU will be long-term)  continue weight-bearing exercises at least 2x/week Aerobic activity at least 150 minutes/week  Sign release of records (last tetanus and any other important records)

## 2014-08-25 ENCOUNTER — Other Ambulatory Visit: Payer: 59

## 2014-08-25 DIAGNOSIS — E559 Vitamin D deficiency, unspecified: Secondary | ICD-10-CM

## 2014-08-25 DIAGNOSIS — Z5181 Encounter for therapeutic drug level monitoring: Secondary | ICD-10-CM

## 2014-08-25 DIAGNOSIS — I10 Essential (primary) hypertension: Secondary | ICD-10-CM

## 2014-08-25 DIAGNOSIS — E7800 Pure hypercholesterolemia, unspecified: Secondary | ICD-10-CM

## 2014-08-25 LAB — CBC WITH DIFFERENTIAL/PLATELET
BASOS PCT: 1 % (ref 0–1)
Basophils Absolute: 0 10*3/uL (ref 0.0–0.1)
EOS ABS: 0.3 10*3/uL (ref 0.0–0.7)
EOS PCT: 10 % — AB (ref 0–5)
HCT: 39.2 % (ref 36.0–46.0)
HEMOGLOBIN: 13.2 g/dL (ref 12.0–15.0)
Lymphocytes Relative: 27 % (ref 12–46)
Lymphs Abs: 0.8 10*3/uL (ref 0.7–4.0)
MCH: 29 pg (ref 26.0–34.0)
MCHC: 33.7 g/dL (ref 30.0–36.0)
MCV: 86.2 fL (ref 78.0–100.0)
MONOS PCT: 6 % (ref 3–12)
Monocytes Absolute: 0.2 10*3/uL (ref 0.1–1.0)
NEUTROS PCT: 56 % (ref 43–77)
Neutro Abs: 1.7 10*3/uL (ref 1.7–7.7)
Platelets: 141 10*3/uL — ABNORMAL LOW (ref 150–400)
RBC: 4.55 MIL/uL (ref 3.87–5.11)
RDW: 13.9 % (ref 11.5–15.5)
WBC: 3.1 10*3/uL — ABNORMAL LOW (ref 4.0–10.5)

## 2014-08-25 LAB — COMPREHENSIVE METABOLIC PANEL
ALK PHOS: 78 U/L (ref 39–117)
ALT: 12 U/L (ref 0–35)
AST: 14 U/L (ref 0–37)
Albumin: 4.2 g/dL (ref 3.5–5.2)
BILIRUBIN TOTAL: 0.6 mg/dL (ref 0.2–1.2)
BUN: 21 mg/dL (ref 6–23)
CO2: 28 mEq/L (ref 19–32)
CREATININE: 1.04 mg/dL (ref 0.50–1.10)
Calcium: 9.4 mg/dL (ref 8.4–10.5)
Chloride: 103 mEq/L (ref 96–112)
Glucose, Bld: 102 mg/dL — ABNORMAL HIGH (ref 70–99)
Potassium: 4.5 mEq/L (ref 3.5–5.3)
SODIUM: 140 meq/L (ref 135–145)
TOTAL PROTEIN: 6.5 g/dL (ref 6.0–8.3)

## 2014-08-25 LAB — LIPID PANEL
CHOLESTEROL: 201 mg/dL — AB (ref 0–200)
HDL: 56 mg/dL (ref >39)
LDL CALC: 134 mg/dL — AB (ref 0–99)
Total CHOL/HDL Ratio: 3.6 Ratio
Triglycerides: 55 mg/dL (ref <150)
VLDL: 11 mg/dL (ref 0–40)

## 2014-08-25 LAB — TSH: TSH: 3.165 u[IU]/mL (ref 0.350–4.500)

## 2014-08-26 LAB — VITAMIN D 25 HYDROXY (VIT D DEFICIENCY, FRACTURES): VIT D 25 HYDROXY: 19 ng/mL — AB (ref 30–89)

## 2014-08-28 ENCOUNTER — Other Ambulatory Visit: Payer: Self-pay | Admitting: Family Medicine

## 2014-08-28 DIAGNOSIS — E559 Vitamin D deficiency, unspecified: Secondary | ICD-10-CM

## 2014-08-28 MED ORDER — VITAMIN D (ERGOCALCIFEROL) 1.25 MG (50000 UNIT) PO CAPS: 50000.0000 [IU] | ORAL_CAPSULE | ORAL | Status: DC

## 2014-10-05 ENCOUNTER — Ambulatory Visit
Admission: RE | Admit: 2014-10-05 | Discharge: 2014-10-05 | Disposition: A | Discharge status: Home / Self Care | Payer: 59 | Source: Ambulatory Visit | Attending: Family Medicine | Admitting: Family Medicine

## 2014-10-05 ENCOUNTER — Ambulatory Visit (INDEPENDENT_AMBULATORY_CARE_PROVIDER_SITE_OTHER): Payer: 59 | Admitting: Family Medicine

## 2014-10-05 ENCOUNTER — Encounter: Payer: Self-pay | Admitting: Family Medicine

## 2014-10-05 VITALS — BP 144/88 | HR 80 | Temp 97.7°F | Ht 67.0 in | Wt 204.0 lb

## 2014-10-05 DIAGNOSIS — R059 Cough, unspecified: Secondary | ICD-10-CM

## 2014-10-05 DIAGNOSIS — R05 Cough: Secondary | ICD-10-CM

## 2014-10-05 DIAGNOSIS — J45909 Unspecified asthma, uncomplicated: Secondary | ICD-10-CM

## 2014-10-05 MED ORDER — AZITHROMYCIN 250 MG PO TABS: ORAL_TABLET | ORAL | Status: DC

## 2014-10-05 MED ORDER — ALBUTEROL SULFATE 108 (90 BASE) MCG/ACT IN AEPB: 1.0000 | INHALATION_SPRAY | RESPIRATORY_TRACT | Status: DC | PRN

## 2014-10-05 MED ORDER — BENZONATATE 200 MG PO CAPS: 200.0000 mg | ORAL_CAPSULE | Freq: Three times a day (TID) | ORAL | Status: DC | PRN

## 2014-10-05 MED ORDER — HYDROCODONE-HOMATROPINE 5-1.5 MG/5ML PO SYRP: 5.0000 mL | ORAL_SOLUTION | Freq: Every evening | ORAL | Status: DC | PRN

## 2014-10-05 NOTE — Patient Instructions (Signed)
  Use Mucinex twice daily (plain or DM). Tessalon perles to be used during the day Hycodan syrup at bedtime Use the inhaler as needed for wheezing/shortness of breath. Go to [facility] ([address]) for chest x-ray.  Return next week for recheck if not completely better.

## 2014-10-05 NOTE — Progress Notes (Signed)
Chief Complaint  Patient presents with  . cough    cough and congestion, started last sunday, no body aches, fever, having fatigue   8 days ago she started feeling fatigued, then started with head congestion and "sinuses", then it turned into a cough, which has progressively gotten worse.  She recently started wheezing, and coughing to the point of vomiting (post-tussive).  She had laryngitis 3 days ago (better now).  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 She thought she had been getting better, but felt extremely worn out from just doing a little shopping yesterday--not normal for her.  Denies fevers, chills. Unable to get any mucus out of her nose. Has noted slight bleeding from the nose in the morning.  She is coughing up phlegm, but swallowing it--hasn't spit any out to see the color.  +sick contacts (grandchildren), sick with double ear infections, colds.  She hasn't been using any OTC medications.  PMH, PSH, SH reviewed/updated.  Outpatient Encounter Prescriptions as of 10/05/2014  Medication Sig Note  . amLODipine (NORVASC) 10 MG tablet Take 1 tablet (10 mg total) by mouth daily.   . citalopram (CELEXA) 40 MG tablet Take 0.5 tablets (20 mg total) by mouth daily.   . fluticasone (FLONASE) 50 MCG/ACT nasal spray 2 sprays by Each Nare route daily. 10/05/2014: Restarted with recent illness  . hydrocortisone (ANUSOL-HC) 25 MG suppository Place 1 suppository (25 mg total) rectally 2 (two) times daily as needed for hemorrhoids.   Marland Kitchen ibuprofen (ADVIL,MOTRIN) 200 MG tablet Take 600-800 mg by mouth every 6 (six) hours as needed for pain.   Marland Kitchen losartan-hydrochlorothiazide (HYZAAR) 100-25 MG per tablet Take 1 tablet by mouth daily.   . Vitamin D, Ergocalciferol, (DRISDOL) 50000 UNITS CAPS capsule Take 1 capsule (50,000 Units total) by mouth every 7 (seven) days.    No Known Allergies  Denies fevers, chills.  +sinus headaches, and some dizziness (light  headed).  No chest pain. +shortness of breath, cough.  Denies nausea (had some, better now) or diarrhea. Vomited after coughing spell.  Denies rashes, bleeding/bruising.  PHYSICAL EXAM: BP 144/88 mmHg  Pulse 80  Temp(Src) 97.7 F (36.5 C) (Tympanic)  Ht 5\' 7"  (1.702 m)  Wt 204 lb (92.534 kg)  BMI 31.94 kg/m2  SpO2 97% Well appearing female, speaking easily in full sentences, with some deep, dry cough HEENT: PERRL, EOMi, conjunctiva clear. TM's and EAC's normal. Nasal mucosa is mildly edematous, with clear mucus. No purulence.  Sinuses are nontender. OP is clear Neck: no lymphadenopathy or mass Heart: regular rate and rhythm without murmur Lungs: some scattered ronchi and wheezes, more on the right than left.  Also some focal rales at right base.  Fair air movement. Skin: no rashes Neuro: alert and oriented, cranial nerves intact, normal gait Psych: normal mood, affect, hygiene and grooming   ASSESSMENT/PLAN:  Cough - Plan: benzonatate (TESSALON) 200 MG capsule, HYDROcodone-homatropine (HYCODAN) 5-1.5 MG/5ML syrup, DG Chest 2 View  Asthmatic bronchitis, unspecified asthma severity, uncomplicated - Plan: azithromycin (ZITHROMAX) 250 MG tablet, Albuterol Sulfate (PROAIR RESPICLICK) 694 (90 BASE) MCG/ACT AEPB   Cough--bronchitis vs early pneumonia.  Check CXR.  Use Mucinex twice daily (plain or DM). Tessalon perles to be used during the day Hycodan syrup at bedtime Use the inhaler as needed for wheezing/shortness of breath.--demonstrated how to use repi-click inhaler Go to [facility] ([address]) for chest x-ray.  Return next week for recheck if not  completely better. Risks/side effects of meds reviewed.  ADDENDUM:  CXR normal

## 2014-10-07 ENCOUNTER — Telehealth: Payer: Self-pay | Admitting: Family Medicine

## 2014-10-07 MED ORDER — ALBUTEROL SULFATE HFA 108 (90 BASE) MCG/ACT IN AERS: 2.0000 | INHALATION_SPRAY | Freq: Four times a day (QID) | RESPIRATORY_TRACT | Status: DC | PRN

## 2014-10-07 NOTE — Telephone Encounter (Signed)
Recv'd fax from pharmacy that states ProAir Respiclick not covered by insurance.  Lower cost alternative is Ventolin HFA,  Do you want to switch and pt will get quickly or do you want me to do a prior authorization which may take several days ?

## 2014-10-07 NOTE — Telephone Encounter (Signed)
I sent change to Ventolin HFA to pharmacy.  Please advise pt that she may need to use spacer along with it (if she has trouble timing the puffing and the breathing, to ensure delivery of the medication to her lungs rather than spraying her mouth)

## 2014-10-07 NOTE — Telephone Encounter (Signed)
Left message for pt

## 2014-11-18 ENCOUNTER — Other Ambulatory Visit: Payer: Self-pay | Admitting: Family Medicine

## 2015-02-22 ENCOUNTER — Ambulatory Visit (INDEPENDENT_AMBULATORY_CARE_PROVIDER_SITE_OTHER): Payer: 59 | Admitting: Family Medicine

## 2015-02-22 ENCOUNTER — Encounter: Payer: Self-pay | Admitting: Family Medicine

## 2015-02-22 VITALS — BP 138/84 | HR 64 | Ht 67.0 in | Wt 210.6 lb

## 2015-02-22 DIAGNOSIS — R7301 Impaired fasting glucose: Secondary | ICD-10-CM | POA: Diagnosis not present

## 2015-02-22 DIAGNOSIS — K219 Gastro-esophageal reflux disease without esophagitis: Secondary | ICD-10-CM | POA: Diagnosis not present

## 2015-02-22 DIAGNOSIS — Z5181 Encounter for therapeutic drug level monitoring: Secondary | ICD-10-CM | POA: Diagnosis not present

## 2015-02-22 DIAGNOSIS — E7800 Pure hypercholesterolemia, unspecified: Secondary | ICD-10-CM

## 2015-02-22 DIAGNOSIS — F419 Anxiety disorder, unspecified: Secondary | ICD-10-CM

## 2015-02-22 DIAGNOSIS — J309 Allergic rhinitis, unspecified: Secondary | ICD-10-CM | POA: Diagnosis not present

## 2015-02-22 DIAGNOSIS — E559 Vitamin D deficiency, unspecified: Secondary | ICD-10-CM | POA: Diagnosis not present

## 2015-02-22 DIAGNOSIS — I10 Essential (primary) hypertension: Secondary | ICD-10-CM

## 2015-02-22 LAB — CBC WITH DIFFERENTIAL/PLATELET
Basophils Absolute: 0 10*3/uL (ref 0.0–0.1)
Basophils Relative: 1 % (ref 0–1)
Eosinophils Absolute: 0.3 10*3/uL (ref 0.0–0.7)
Eosinophils Relative: 7 % — ABNORMAL HIGH (ref 0–5)
HCT: 41.2 % (ref 36.0–46.0)
HEMOGLOBIN: 14.1 g/dL (ref 12.0–15.0)
LYMPHS ABS: 0.9 10*3/uL (ref 0.7–4.0)
LYMPHS PCT: 22 % (ref 12–46)
MCH: 29 pg (ref 26.0–34.0)
MCHC: 34.2 g/dL (ref 30.0–36.0)
MCV: 84.8 fL (ref 78.0–100.0)
MPV: 8.7 fL (ref 8.6–12.4)
Monocytes Absolute: 0.2 10*3/uL (ref 0.1–1.0)
Monocytes Relative: 6 % (ref 3–12)
NEUTROS ABS: 2.6 10*3/uL (ref 1.7–7.7)
NEUTROS PCT: 64 % (ref 43–77)
Platelets: 221 10*3/uL (ref 150–400)
RBC: 4.86 MIL/uL (ref 3.87–5.11)
RDW: 13.9 % (ref 11.5–15.5)
WBC: 4.1 10*3/uL (ref 4.0–10.5)

## 2015-02-22 MED ORDER — LOSARTAN POTASSIUM-HCTZ 100-25 MG PO TABS: 1.0000 | ORAL_TABLET | Freq: Every day | ORAL | Status: DC

## 2015-02-22 MED ORDER — AMLODIPINE BESYLATE 10 MG PO TABS: 10.0000 mg | ORAL_TABLET | Freq: Every day | ORAL | Status: DC

## 2015-02-22 MED ORDER — FLUTICASONE PROPIONATE 50 MCG/ACT NA SUSP: 2.0000 | Freq: Every day | NASAL | Status: DC

## 2015-02-22 NOTE — Patient Instructions (Addendum)
You need to get 1200-1500mg  of calcium daily, through diet and vitamins.  You can get this from milk, yogurt, greens, but also from either Tums, Viactiv chews or calcium +D tablets.  You might need once or twice daily, depending on your diet.  Don't take 2 together. You need 1000-2000 IU of vitamin D daily long-term. If taking a Calcium+D supplement (either the Viactiv or pill), then take 1000 IU of a separate vitamin D3 once daily (this can be started after your prescription is completed, if another prescription is needed, based on today's labs).  Cholesterol--consider using ground [place]. Consider red sauce in place of alfredo sauce.  Please call Dr. Estell Harpin office to schedule your colonoscopy.  Fat and Cholesterol Control Diet Fat and cholesterol levels in your blood and organs are influenced by your diet. High levels of fat and cholesterol may lead to diseases of the heart, small and large blood vessels, gallbladder, liver, and pancreas. CONTROLLING FAT AND CHOLESTEROL WITH DIET Although exercise and lifestyle factors are important, your diet is key. That is because certain foods are known to raise cholesterol and others to lower it. The goal is to balance foods for their effect on cholesterol and more importantly, to replace saturated and trans fat with other types of fat, such as monounsaturated fat, polyunsaturated fat, and omega-3 fatty acids. On average, a person should consume no more than 15 to 17 g of saturated fat daily. Saturated and trans fats are considered "bad" fats, and they will raise LDL cholesterol. Saturated fats are primarily found in animal products such as meats, butter, and cream. However, that does not mean you need to give up all your favorite foods. Today, there are good tasting, low-fat, low-cholesterol substitutes for most of the things you like to eat. Choose low-fat or nonfat alternatives. Choose round or loin cuts of red meat. These types of cuts are lowest in fat and  cholesterol. Chicken (without the skin), fish, veal, and ground [place] breast are great choices. Eliminate fatty meats, such as hot dogs and salami. Even shellfish have little or no saturated fat. Have a 3 oz (85 g) portion when you eat lean meat, poultry, or fish. Trans fats are also called "partially hydrogenated oils." They are oils that have been scientifically manipulated so that they are solid at room temperature resulting in a longer shelf life and improved taste and texture of foods in which they are added. Trans fats are found in stick margarine, some tub margarines, cookies, crackers, and baked goods.  When baking and cooking, oils are a great substitute for butter. The monounsaturated oils are especially beneficial since it is believed they lower LDL and raise HDL. The oils you should avoid entirely are saturated tropical oils, such as coconut and palm.  Remember to eat a lot from food groups that are naturally free of saturated and trans fat, including fish, fruit, vegetables, beans, grains (barley, rice, couscous, bulgur wheat), and pasta (without cream sauces).  IDENTIFYING FOODS THAT LOWER FAT AND CHOLESTEROL  Soluble fiber may lower your cholesterol. This type of fiber is found in fruits such as apples, vegetables such as broccoli, potatoes, and carrots, legumes such as beans, peas, and lentils, and grains such as barley. Foods fortified with plant sterols (phytosterol) may also lower cholesterol. You should eat at least 2 g per day of these foods for a cholesterol lowering effect.  Read package labels to identify low-saturated fats, trans fat free, and low-fat foods at the supermarket. Select cheeses that  have only 2 to 3 g saturated fat per ounce. Use a heart-healthy tub margarine that is free of trans fats or partially hydrogenated oil. When buying baked goods (cookies, crackers), avoid partially hydrogenated oils. Breads and muffins should be made from whole grains (whole-wheat or whole oat  flour, instead of "flour" or "enriched flour"). Buy non-creamy canned soups with reduced salt and no added fats.  FOOD PREPARATION TECHNIQUES  Never deep-fry. If you must fry, either stir-fry, which uses very little fat, or use non-stick cooking sprays. When possible, broil, bake, or roast meats, and steam vegetables. Instead of putting butter or margarine on vegetables, use lemon and herbs, applesauce, and cinnamon (for squash and sweet potatoes). Use nonfat yogurt, salsa, and low-fat dressings for salads.  LOW-SATURATED FAT / LOW-FAT FOOD SUBSTITUTES Meats / Saturated Fat (g)  Avoid: Steak, marbled (3 oz/85 g) / 11 g  Choose: Steak, lean (3 oz/85 g) / 4 g  Avoid: Hamburger (3 oz/85 g) / 7 g  Choose: Hamburger, lean (3 oz/85 g) / 5 g  Avoid: Ham (3 oz/85 g) / 6 g  Choose: Ham, lean cut (3 oz/85 g) / 2.4 g  Avoid: Chicken, with skin, dark meat (3 oz/85 g) / 4 g  Choose: Chicken, skin removed, dark meat (3 oz/85 g) / 2 g  Avoid: Chicken, with skin, light meat (3 oz/85 g) / 2.5 g  Choose: Chicken, skin removed, light meat (3 oz/85 g) / 1 g Dairy / Saturated Fat (g)  Avoid: Whole milk (1 cup) / 5 g  Choose: Low-fat milk, 2% (1 cup) / 3 g  Choose: Low-fat milk, 1% (1 cup) / 1.5 g  Choose: Skim milk (1 cup) / 0.3 g  Avoid: Hard cheese (1 oz/28 g) / 6 g  Choose: Skim milk cheese (1 oz/28 g) / 2 to 3 g  Avoid: Cottage cheese, 4% fat (1 cup) / 6.5 g  Choose: Low-fat cottage cheese, 1% fat (1 cup) / 1.5 g  Avoid: Ice cream (1 cup) / 9 g  Choose: Sherbet (1 cup) / 2.5 g  Choose: Nonfat frozen yogurt (1 cup) / 0.3 g  Choose: Frozen fruit bar / trace  Avoid: Whipped cream (1 tbs) / 3.5 g  Choose: Nondairy whipped topping (1 tbs) / 1 g Condiments / Saturated Fat (g)  Avoid: Mayonnaise (1 tbs) / 2 g  Choose: Low-fat mayonnaise (1 tbs) / 1 g  Avoid: Butter (1 tbs) / 7 g  Choose: Extra light margarine (1 tbs) / 1 g  Avoid: Coconut oil (1 tbs) / 11.8 g  Choose: Olive  oil (1 tbs) / 1.8 g  Choose: Corn oil (1 tbs) / 1.7 g  Choose: Safflower oil (1 tbs) / 1.2 g  Choose: Sunflower oil (1 tbs) / 1.4 g  Choose: Soybean oil (1 tbs) / 2.4 g  Choose: Canola oil (1 tbs) / 1 g Document Released: 10/02/2005 Document Revised: 01/27/2013 Document Reviewed: 12/31/2013 [employer] Patient Information 2015 [place], [place]. This information is not intended to replace advice given to you by your health care provider. Make sure you discuss any questions you have with your health care provider.   Try taking Pepcid AC prior to a meal that you know will trigger reflux (ie pizza). Weight loss will also help with reflux.  Food Choices for Gastroesophageal Reflux Disease When you have gastroesophageal reflux disease (GERD), the foods you eat and your eating habits are very important. Choosing the right foods can help ease the  discomfort of GERD. WHAT GENERAL GUIDELINES DO I NEED TO FOLLOW?  Choose fruits, vegetables, whole grains, low-fat dairy products, and low-fat meat, fish, and poultry.  Limit fats such as oils, salad dressings, butter, nuts, and avocado.  Keep a food diary to identify foods that cause symptoms.  Avoid foods that cause reflux. These may be different for different people.  Eat frequent small meals instead of three large meals each day.  Eat your meals slowly, in a relaxed setting.  Limit fried foods.  Cook foods using methods other than frying.  Avoid drinking alcohol.  Avoid drinking large amounts of liquids with your meals.  Avoid bending over or lying down until 2-3 hours after eating. WHAT FOODS ARE NOT RECOMMENDED? The following are some foods and drinks that may worsen your symptoms: Vegetables Tomatoes. Tomato juice. Tomato and spaghetti sauce. Chili peppers. Onion and garlic. Horseradish. Fruits Oranges, grapefruit, and lemon (fruit and juice). Meats High-fat meats, fish, and poultry. This includes hot dogs, ribs, ham, sausage,  salami, and bacon. Dairy Whole milk and chocolate milk. Sour cream. Cream. Butter. Ice cream. Cream cheese.  Beverages Coffee and tea, with or without caffeine. Carbonated beverages or energy drinks. Condiments Hot sauce. Barbecue sauce.  Sweets/Desserts Chocolate and cocoa. Donuts. Peppermint and spearmint. Fats and Oils High-fat foods, including [place] fries and potato chips. Other Vinegar. Strong spices, such as black pepper, white pepper, red pepper, cayenne, curry powder, cloves, ginger, and chili powder. The items listed above may not be a complete list of foods and beverages to avoid. Contact your dietitian for more information. Document Released: 10/02/2005 Document Revised: 10/07/2013 Document Reviewed: 08/06/2013 [facility] Patient Information 2015 [place], [place]. This information is not intended to replace advice given to you by your health care provider. Make sure you discuss any questions you have with your health care provider.

## 2015-02-22 NOTE — Progress Notes (Signed)
Chief complaint:  Med check  She has had some allergies, which progressed into a cold and ongoing cough x 2 weeks.  Phlegm is clear-white.  She has sinus congestion, but mucus is also clear-white, not discolored.  No fevers/chills.  She hasn't been using Flonase regularly--needs refill.  She tried "some generic medicine for a few days", doesn't recall what it was, but it didn't help.  Vick's cough drops didn't help.  She tried Coricidin HBP without much help.  Hasn't used any robitussin or mucinex for the cough.  Hypertension follow-up:  Blood pressures elsewhere are 127-130's/high 70's.  Denies dizziness, headaches, chest pain.  Denies side effects of medications. She hasn't been getting any regular exercise.  Admits to some stress-eating.  Hyperlipidemia--borderline on last check 6 mos ago.  Due for recheck today.  Hasn't made any changes in her diet since her last visit 3x/week. No eggs. No creamy dressings, soups.  +alfredo sauce once a week.  Pizza once a week, cheese on tacos once a week.  Uses ground beef, doesn't like the texture of ground [place].  Lab Results  Component Value Date   CHOL 201* 08/25/2014   HDL 56 08/25/2014   LDLCALC 134* 08/25/2014   TRIG 55 08/25/2014   CHOLHDL 3.6 08/25/2014   Anxiety follow-up:  Moods are good, anxiety is controlled. (was bad when her son with bipolar was living with her, having a lot of stress).  Her son's moods have been well controlled, and anxiety is improved. She currently denies any depressive symptoms.  Denies any side effects to the medication.  Vitamin D deficiency:  6 months ago it was low at 58. She was treated with additional 12 weeks of prescription therapy.  She admits to never starting a separate vitamin D daily supplement upon completion of the Rx.  Osteopenia: monitored by GYN.  Last DEXA was 10/2012.  She will let her GYN schedule at her f/u, and she believes she is due for appt soon.  Colon polyp--past due for colonoscopy.  She  finally got a work schedule, and plans to schedule it.  Review of last labs--WBC low at 3.1, plt 141,  o/w normal CBC.  Normal TSH, and fasting glucose was 102, rest of chem panel normal.  Other labs as above.  PMH, PSH, SH and FH reviewed/updated.  Outpatient Encounter Prescriptions as of 02/22/2015  Medication Sig  . amLODipine (NORVASC) 10 MG tablet Take 1 tablet (10 mg total) by mouth daily.  . citalopram (CELEXA) 40 MG tablet Take 0.5 tablets (20 mg total) by mouth daily.  Marland Kitchen losartan-hydrochlorothiazide (HYZAAR) 100-25 MG per tablet Take 1 tablet by mouth daily.  . fluticasone (FLONASE) 50 MCG/ACT nasal spray 2 sprays by Each Nare route daily.  . Hydrocortisone (ANUSOL-HC RE) Place rectally.  Marland Kitchen ibuprofen (ADVIL,MOTRIN) 200 MG tablet Take 600-800 mg by mouth every 6 (six) hours as needed for pain.  . [DISCONTINUED] albuterol (PROVENTIL HFA;VENTOLIN HFA) 108 (90 BASE) MCG/ACT inhaler Inhale 2 puffs into the lungs every 6 (six) hours as needed for wheezing or shortness of breath.  . [DISCONTINUED] azithromycin (ZITHROMAX) 250 MG tablet Take 2 tablets by mouth on first day, then 1 tablet by mouth on days 2 through 5  . [DISCONTINUED] benzonatate (TESSALON) 200 MG capsule Take 1 capsule (200 mg total) by mouth 3 (three) times daily as needed for cough.  . [DISCONTINUED] HYDROcodone-homatropine (HYCODAN) 5-1.5 MG/5ML syrup Take 5 mLs by mouth at bedtime as needed for cough.  . [DISCONTINUED] hydrocortisone (ANUSOL-HC)  25 MG suppository Place 1 suppository (25 mg total) rectally 2 (two) times daily as needed for hemorrhoids.  . [DISCONTINUED] Vitamin D, Ergocalciferol, (DRISDOL) 50000 UNITS CAPS capsule Take 1 capsule (50,000 Units total) by mouth every 7 (seven) days.   No facility-administered encounter medications on file as of 02/22/2015.   No Known Allergies  ROS: No nausea, vomiting, diarrhea.  No fevers, chills.  +reflux 2x/week Weight gain of 6 pounds since last med check. No headaches,  dizziness, chest pain, edema, muscle cramps.  +cough/congestion as per HPI. No shortness of breaeth  PHYSICAL EXAM: BP 138/84 mmHg  Pulse 64  Ht 5\' 7"  (1.702 m)  Wt 210 lb 9.6 oz (95.528 kg)  BMI 32.98 kg/m2   Physical Exam  Constitutional: She is oriented to person, place, and time. She appears well-developed and well-nourished. No distress.  HENT:  Head: Normocephalic and atraumatic.  Right Ear: External ear normal.  Left Ear: External ear normal.  Nose: Nose mildly edematous mucosa, no erythema or purulent drainage.  Sinuses nontender  Mouth/Throat: Oropharynx is clear and moist.  Upper dentures, lower partials  Eyes: Conjunctivae and EOM are normal. Pupils are equal, round, and reactive to light. No scleral icterus.  Neck: Normal range of motion. Neck supple. No thyromegaly present. No carotid bruit Cardiovascular: Normal rate, regular rhythm, normal heart sounds and intact distal pulses. No murmur heard. Pulmonary/Chest: Effort normal and breath sounds normal. No respiratory distress. She has no wheezes. She has no rales or ronchi. Abdominal: Soft. Bowel sounds are normal. She exhibits no distension and no mass. There is no tenderness. There is no guarding.  Musculoskeletal: She exhibits no edema or tenderness.  Lymphadenopathy:   She has no cervical adenopathy.  Neurological: She is alert and oriented to person, place, and time.  No cranial nerve deficit. Coordination normal.  Skin: Skin is warm and dry. No rash noted.  Psychiatric: She has a normal mood and affect. Her behavior is normal. Judgment and thought content normal.   ASSESSMENT/PLAN:   Essential (primary) hypertension - controlled on current regimen.  daily exercise and weight loss encouraged  Anxiety disorder, unspecified anxiety disorder type - controlled  Gastroesophageal reflux disease without esophagitis - diet reviewed, and OTC meds prn  Vitamin D deficiency - suspect it will be low again, due to no  ongoing supplementation.  recommendations reviewed - Plan: Vit D  25 hydroxy (rtn osteoporosis monitoring)  Impaired fasting glucose - encouraged daily exercise, weight loss, and cutting back on carbs, sweets - Plan: Hemoglobin A1c, Glucose, random  Pure hypercholesterolemia - low cholesterol diet and goals reviewed - Plan: Lipid panel  Medication monitoring encounter - Plan: CBC with Differential/Platelet, Vit D  25 hydroxy (rtn osteoporosis monitoring)  Essential hypertension - Plan: amLODipine (NORVASC) 10 MG tablet, losartan-hydrochlorothiazide (HYZAAR) 100-25 MG per tablet  Allergic rhinitis, unspecified allergic rhinitis type - I suspect this is contributing to her congestion/cough. Use Flonase regularly, mucinex DM prn cough - Plan: fluticasone (FLONASE) 50 MCG/ACT nasal spray   Glucose, lipids, CBC, Vitamin D, A1c   You need to get 1200-1500mg  of calcium daily, through diet and vitamins.  You can get this from milk, yogurt, greens, but also from either Tums, Viactiv chews or calcium +D tablets.  You might need once or twice daily, depending on your diet.  Don't take 2 together. You need 1000-2000 IU of vitamin D daily long-term. If taking a Calcium+D supplement (either the Viactiv or pill), then take 1000 IU of a separate vitamin  D3 once daily (this can be started after your prescription is completed, if another prescription is needed, based on today's labs).  Cholesterol--consider using ground [place]. Consider red sauce in place of alfredo sauce.  Please call Dr. Estell Harpin office to schedule your colonoscopy.  GERD--diet reviewed.  Cough--restart Flonase and use Mucinex DM or Robitussin  SEND COPY OF LABS TO DR. FONTAINE

## 2015-02-23 DIAGNOSIS — E559 Vitamin D deficiency, unspecified: Secondary | ICD-10-CM | POA: Insufficient documentation

## 2015-02-23 DIAGNOSIS — E7800 Pure hypercholesterolemia, unspecified: Secondary | ICD-10-CM | POA: Insufficient documentation

## 2015-02-23 DIAGNOSIS — R7301 Impaired fasting glucose: Secondary | ICD-10-CM | POA: Insufficient documentation

## 2015-02-23 LAB — LIPID PANEL
CHOL/HDL RATIO: 3.8 ratio
Cholesterol: 219 mg/dL — ABNORMAL HIGH (ref 0–200)
HDL: 57 mg/dL (ref >=46)
LDL Cholesterol: 152 mg/dL — ABNORMAL HIGH (ref 0–99)
Triglycerides: 50 mg/dL (ref <150)
VLDL: 10 mg/dL (ref 0–40)

## 2015-02-23 LAB — GLUCOSE, RANDOM: GLUCOSE: 111 mg/dL — AB (ref 70–99)

## 2015-02-23 LAB — HEMOGLOBIN A1C
Hgb A1c MFr Bld: 5.7 % — ABNORMAL HIGH (ref <5.7)
Mean Plasma Glucose: 117 mg/dL — ABNORMAL HIGH (ref <117)

## 2015-02-23 LAB — VITAMIN D 25 HYDROXY (VIT D DEFICIENCY, FRACTURES): VIT D 25 HYDROXY: 15 ng/mL — AB (ref 30–100)

## 2015-02-23 MED ORDER — VITAMIN D (ERGOCALCIFEROL) 1.25 MG (50000 UNIT) PO CAPS: 50000.0000 [IU] | ORAL_CAPSULE | ORAL | Status: DC

## 2015-02-23 NOTE — Addendum Note (Signed)
Addended by: Rita Ohara on: 02/23/2015 01:28 PM   Modules accepted: Orders

## 2015-02-28 ENCOUNTER — Other Ambulatory Visit: Payer: Self-pay | Admitting: Family Medicine

## 2015-04-04 ENCOUNTER — Other Ambulatory Visit: Payer: Self-pay | Admitting: Family Medicine

## 2015-06-06 ENCOUNTER — Other Ambulatory Visit: Payer: Self-pay | Admitting: Gynecology

## 2015-06-30 ENCOUNTER — Other Ambulatory Visit: Payer: Self-pay

## 2015-07-12 ENCOUNTER — Other Ambulatory Visit: Payer: Self-pay | Admitting: Gastroenterology

## 2015-07-12 LAB — HM COLONOSCOPY

## 2015-07-21 ENCOUNTER — Telehealth: Payer: Self-pay

## 2015-07-21 NOTE — Telephone Encounter (Signed)
Medical records received from [facility] on 07/21/15

## 2015-07-22 ENCOUNTER — Encounter: Payer: Self-pay | Admitting: Internal Medicine

## 2015-08-06 ENCOUNTER — Other Ambulatory Visit: Payer: Self-pay | Admitting: Family Medicine

## 2015-08-06 NOTE — Telephone Encounter (Signed)
Dr.knapp is this okay to refill 

## 2015-08-06 NOTE — Telephone Encounter (Signed)
[facility] for #[phone].  No refill.  She is due for med check early-mid November, but doesn't have one scheduled (other meds will be needing refills then too)--she needs to schedule med check

## 2015-08-06 NOTE — Telephone Encounter (Signed)
Left message for pt to call back  °

## 2015-08-09 ENCOUNTER — Other Ambulatory Visit: Payer: Self-pay | Admitting: Family Medicine

## 2015-08-09 ENCOUNTER — Telehealth: Payer: Self-pay | Admitting: Family Medicine

## 2015-08-09 MED ORDER — CITALOPRAM HYDROBROMIDE 40 MG PO TABS: ORAL_TABLET | ORAL | Status: DC

## 2015-08-09 NOTE — Telephone Encounter (Signed)
Pt will call back to schedule med check.  She is going to [place] for 2 weeks.  Explained no further refills until Dr. Tomi Thomas sees her.  She understands

## 2015-08-26 ENCOUNTER — Other Ambulatory Visit: Payer: Self-pay | Admitting: Family Medicine

## 2015-09-08 ENCOUNTER — Ambulatory Visit (INDEPENDENT_AMBULATORY_CARE_PROVIDER_SITE_OTHER): Payer: 59 | Admitting: Family Medicine

## 2015-09-08 ENCOUNTER — Encounter: Payer: Self-pay | Admitting: Family Medicine

## 2015-09-08 VITALS — BP 120/86 | HR 60 | Ht 67.0 in | Wt 204.4 lb

## 2015-09-08 DIAGNOSIS — E559 Vitamin D deficiency, unspecified: Secondary | ICD-10-CM

## 2015-09-08 DIAGNOSIS — I10 Essential (primary) hypertension: Secondary | ICD-10-CM | POA: Diagnosis not present

## 2015-09-08 DIAGNOSIS — F419 Anxiety disorder, unspecified: Secondary | ICD-10-CM

## 2015-09-08 DIAGNOSIS — Z23 Encounter for immunization: Secondary | ICD-10-CM | POA: Diagnosis not present

## 2015-09-08 DIAGNOSIS — E7800 Pure hypercholesterolemia, unspecified: Secondary | ICD-10-CM | POA: Diagnosis not present

## 2015-09-08 DIAGNOSIS — R7301 Impaired fasting glucose: Secondary | ICD-10-CM

## 2015-09-08 DIAGNOSIS — Z0000 Encounter for general adult medical examination without abnormal findings: Secondary | ICD-10-CM | POA: Diagnosis not present

## 2015-09-08 DIAGNOSIS — Z5181 Encounter for therapeutic drug level monitoring: Secondary | ICD-10-CM | POA: Diagnosis not present

## 2015-09-08 DIAGNOSIS — Z1159 Encounter for screening for other viral diseases: Secondary | ICD-10-CM | POA: Diagnosis not present

## 2015-09-08 LAB — POCT GLYCOSYLATED HEMOGLOBIN (HGB A1C): HEMOGLOBIN A1C: 5.4

## 2015-09-08 MED ORDER — CITALOPRAM HYDROBROMIDE 40 MG PO TABS: ORAL_TABLET | ORAL | Status: DC

## 2015-09-08 NOTE — Progress Notes (Signed)
Chief Complaint  Patient presents with  . Diabetes    nonfasting med check.   She is concerned about a scabbed/swollen/painful lesion on the right posterior heel after a pedicure about 2 weeks ago.  It is finally improving, but she would like it checked.  Impaired fasting glucose--this was noted on her last visit, and she presents today for repeat A1c.  She has been exercising, lost some weight, and is working on improving her diet (see below).  Hypertension follow-up: Blood pressures elsewhere are 127-130's/high 70's-80. Denies dizziness, headaches, chest pain. Denies side effects of medications. She is doing a form of martial arts (like kick boxing), 1 hour twice each week. This has been a great source of stress relief.  Hyperlipidemia--Elevated on the last 2 checks (last LDL six months ago was 152).  She has been eating more salads (honey mustard dressing, provolone cheese, and ham and [place] on the salads). No eggs. No creamy dressings, soups. Doing less of the +alfredo sauce, pizza and tacos (used to have each once a week). Uses ground beef, doesn't like the texture of ground [place].  Anxiety follow-up: Moods are good, anxiety is controlled. Her son's moods have been well controlled, and anxiety is improved (and no longer living with her). She currently denies any depressive symptoms. Denies any side effects to the medication.  Vitamin D deficiency:1 year ago it was low at 59. She was treated with additional 12 weeks of prescription therapy, and the repeat 6 months ago was 15 (after not taking OTC Vitamin D upon completion).  She was treated with another 12 week course after last labs. She again admits to never starting a separate vitamin D daily supplement upon completion of the Rx.  PMH, [place], [place] reviewed; denies changes to [facility]  Outpatient Encounter Prescriptions as of 09/08/2015  Medication Sig Note  . amLODipine (NORVASC) 10 MG tablet Take 1 tablet (10 mg total) by mouth daily.    . citalopram (CELEXA) 40 MG tablet TAKE 0.5 TABLETS (20 MG TOTAL) BY MOUTH DAILY.   . fluticasone (FLONASE) 50 MCG/ACT nasal spray Place 2 sprays into both nostrils daily.   Marland Kitchen losartan-hydrochlorothiazide (HYZAAR) 100-25 MG tablet TAKE 1 TABLET BY MOUTH DAILY.   . [DISCONTINUED] citalopram (CELEXA) 40 MG tablet TAKE 0.5 TABLETS (20 MG TOTAL) BY MOUTH DAILY.   . [DISCONTINUED] citalopram (CELEXA) 40 MG tablet TAKE 0.5 TABLETS (20 MG TOTAL) BY MOUTH DAILY.   . [DISCONTINUED] Hydrocortisone (ANUSOL-HC RE) Place rectally. 02/22/2015: OTC suppository; takes prn hemorrhoid flare  . [DISCONTINUED] ibuprofen (ADVIL,MOTRIN) 200 MG tablet Take 600-800 mg by mouth every 6 (six) hours as needed for pain.   . [DISCONTINUED] losartan-hydrochlorothiazide (HYZAAR) 100-25 MG per tablet TAKE 1 TABLET BY MOUTH DAILY.   . [DISCONTINUED] losartan-hydrochlorothiazide (HYZAAR) 100-25 MG per tablet TAKE 1 TABLET BY MOUTH DAILY.   . [DISCONTINUED] Vitamin D, Ergocalciferol, (DRISDOL) 50000 UNITS CAPS capsule Take 1 capsule (50,000 Units total) by mouth every 7 (seven) days.    No facility-administered encounter medications on file as of 09/08/2015.   No Known Allergies  ROS: no fever, chills, headache, dizziness, URI symptoms, cough, shortness of breath, chest pain, palpitations, nausea, vomiting, bowel changes, urinary complaints, bleeding, bruising or rash other than the sore on her right heel as per HPI.  Moods are good, sleeping well.  Reflux had acted up, temporarily used OTC meds per GI recommendation  PHYSICAL EXAM: BP 120/86 mmHg  Pulse 60  Ht '5\' 7"'  (1.702 m)  Wt 204 lb 6.4  oz (92.715 kg)  BMI 32.01 kg/m2 Pleasant, well-appearing female in no distress.  Her two grandchildren accompany her today. She is in good spirits HEENT: PERRL, EOMI, conjunctiva clear Neck: no lymphadenopathy, thyromegaly or mass Heart: regular rate and rhythm without murmur Lungs: clear bilaterally Extremities: no edema Psych:  normal mood, affect, hygiene and grooming Neuro: alert and oriented. Normal cranial nerves, strength, gait Skin: scab posteriorly on the right heel.  Superficial. No surrounding soft tissue swelling, warmth, erythema, no crusting.  Lab Results  Component Value Date   HGBA1C 5.4 09/08/2015   ASSESSMENT/PLAN:  IFG (impaired fasting glucose) - improved; continue proper diet, regular exercise and weight loss - Plan: HgB A1c  Need for prophylactic vaccination and inoculation against influenza - Plan: Flu Vaccine QUAD 36+ mos PF IM (Fluarix & Fluzone Quad PF)  Essential (primary) hypertension - controlled  Anxiety disorder, unspecified anxiety disorder type - controlled - Plan: citalopram (CELEXA) 40 MG tablet  Vitamin D deficiency - suspect this will be low again and require refill. discussed importance of long-term OTC supplementation - Plan: VITAMIN D 25 Hydroxy (Vit-D Deficiency, Fractures)  Need for hepatitis C screening test  Medication monitoring encounter  Pure hypercholesterolemia - low cholesterol diet reviewed.  recheck at CPE    Vitamin D deficiency-- It is important to take a daily supplement of Vitamin D every day, long-term, or else the Vitamin D level will continue to be low.  You can start taking the 1000 IU daily right now, and don't need to wait until you finish the prescription--this way you get into the habit. If you can remember, you probably would benefit from taking 2000 IU after you finish the prescription, so whenever you are due for a new bottle, just buy the 2000 dose.  Impaired fasting glucose--you Hemoglobin A1c is now normal at 5.4.  Continue to limit your sweets, sugar, carbs and get the regular exercise.  Your cholesterol wasn't rechecked today--please try and continue to limit your cheese, creamy sauces/dressing/soups and red meats.  We will plan to recheck this at your next visit.   Low cholesterol diet reviewed. Continue weight loss efforts  F/u 6  months CPE--fasting labs prior A1c, c-met, lipid, Vit D, TSH, Hep C Ab

## 2015-09-08 NOTE — Patient Instructions (Addendum)
  Vitamin D deficiency-- It is important to take a daily supplement of Vitamin D every day, long-term, or else the Vitamin D level will continue to be low.  You can start taking the 1000 IU daily right now, and don't need to wait until you finish the prescription--this way you get into the habit. If you can remember, you probably would benefit from taking 2000 IU after you finish the prescription, so whenever you are due for a new bottle, just buy the 2000 dose.  Impaired fasting glucose--you Hemoglobin A1c is now normal at 5.4.  Continue to limit your sweets, sugar, carbs and get the regular exercise.  Your cholesterol wasn't rechecked today--please try and continue to limit your cheese, creamy sauces/dressing/soups and red meats.  We will plan to recheck this at your next visit.  Continue to try and follow a low cholesterol diet. Continue your efforts at weight loss and regular exercise.  Use antibacterial ointment to the scab on the right heel, and keep it clean, until it completely heals. Return if fever, increasing/spreading redness, drainage, crusting, pain or other concerns.

## 2015-09-09 LAB — VITAMIN D 25 HYDROXY (VIT D DEFICIENCY, FRACTURES): VIT D 25 HYDROXY: 19 ng/mL — AB (ref 30–100)

## 2015-09-09 MED ORDER — VITAMIN D (ERGOCALCIFEROL) 1.25 MG (50000 UNIT) PO CAPS: 50000.0000 [IU] | ORAL_CAPSULE | ORAL | Status: DC

## 2015-09-09 NOTE — Addendum Note (Signed)
Addended by: Rita Ohara on: 09/09/2015 11:34 AM   Modules accepted: Orders

## 2015-09-30 ENCOUNTER — Other Ambulatory Visit: Payer: Self-pay | Admitting: Family Medicine

## 2015-12-01 ENCOUNTER — Other Ambulatory Visit: Payer: Self-pay | Admitting: Family Medicine

## 2015-12-03 ENCOUNTER — Other Ambulatory Visit: Payer: Self-pay | Admitting: Family Medicine

## 2016-01-17 ENCOUNTER — Ambulatory Visit (INDEPENDENT_AMBULATORY_CARE_PROVIDER_SITE_OTHER): Payer: 59 | Admitting: Family Medicine

## 2016-01-17 ENCOUNTER — Encounter: Payer: Self-pay | Admitting: Family Medicine

## 2016-01-17 VITALS — BP 140/84 | HR 82 | Temp 99.1°F | Ht 67.0 in | Wt 198.4 lb

## 2016-01-17 DIAGNOSIS — R0600 Dyspnea, unspecified: Secondary | ICD-10-CM | POA: Diagnosis not present

## 2016-01-17 DIAGNOSIS — J209 Acute bronchitis, unspecified: Secondary | ICD-10-CM

## 2016-01-17 DIAGNOSIS — R062 Wheezing: Secondary | ICD-10-CM | POA: Diagnosis not present

## 2016-01-17 MED ORDER — PREDNISONE 20 MG PO TABS: 20.0000 mg | ORAL_TABLET | Freq: Two times a day (BID) | ORAL | Status: DC

## 2016-01-17 MED ORDER — AZITHROMYCIN 250 MG PO TABS: ORAL_TABLET | ORAL | Status: DC

## 2016-01-17 NOTE — Patient Instructions (Addendum)
   Drink plenty of fluids to keep secretions thing. Continue Coricidin. Add Mucinex (plain or extra strength), and take it as directed. Take z-pak as directed. Call Friday if you aren't improving at all or if worse (may need to change medications) Otherwise, call on day 10 (today is day 1) if you have any persistent symptoms, to get a refill. Return for re-evaluation if worsening shortness of breath, chest pain, high fevers, or other new symptoms develop. Take the prednisone twice daily for 5 days.  If you cannot tolerate it, you can cut back to once a day for 5-7 days.  This should help with the shortness of breath.    There is an interaction between the antibiotic and the citalopram--try and cut back the dose to 1/2 (if unable to quarter then 1/2 tab every other day) for just the 5 days you take the antibiotic.  If your cough is not much better with these medications, feel free to call for a prescription of benzonatate (tessalon perles) to further help with cough).  I recommend you look into grief counseling as a family.
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
## 2016-03-05 ENCOUNTER — Other Ambulatory Visit: Payer: Self-pay | Admitting: Family Medicine

## 2016-04-08 ENCOUNTER — Other Ambulatory Visit: Payer: Self-pay | Admitting: Family Medicine

## 2016-04-11 ENCOUNTER — Other Ambulatory Visit: Payer: 59

## 2016-04-11 DIAGNOSIS — Z1159 Encounter for screening for other viral diseases: Secondary | ICD-10-CM

## 2016-04-11 DIAGNOSIS — R7301 Impaired fasting glucose: Secondary | ICD-10-CM

## 2016-04-11 DIAGNOSIS — E7800 Pure hypercholesterolemia, unspecified: Secondary | ICD-10-CM

## 2016-04-11 DIAGNOSIS — Z0000 Encounter for general adult medical examination without abnormal findings: Secondary | ICD-10-CM

## 2016-04-11 DIAGNOSIS — Z5181 Encounter for therapeutic drug level monitoring: Secondary | ICD-10-CM

## 2016-04-11 DIAGNOSIS — E559 Vitamin D deficiency, unspecified: Secondary | ICD-10-CM

## 2016-04-11 LAB — TSH: TSH: 3.18 m[IU]/L

## 2016-04-11 LAB — HEMOGLOBIN A1C
Hgb A1c MFr Bld: 5.4 % (ref <5.7)
Mean Plasma Glucose: 108 mg/dL

## 2016-04-12 ENCOUNTER — Encounter: Payer: Self-pay | Admitting: Family Medicine

## 2016-04-12 ENCOUNTER — Ambulatory Visit (INDEPENDENT_AMBULATORY_CARE_PROVIDER_SITE_OTHER): Payer: 59 | Admitting: Family Medicine

## 2016-04-12 VITALS — BP 128/78 | HR 80 | Ht 67.0 in | Wt 198.2 lb

## 2016-04-12 DIAGNOSIS — F419 Anxiety disorder, unspecified: Secondary | ICD-10-CM | POA: Diagnosis not present

## 2016-04-12 DIAGNOSIS — Z23 Encounter for immunization: Secondary | ICD-10-CM | POA: Diagnosis not present

## 2016-04-12 DIAGNOSIS — I10 Essential (primary) hypertension: Secondary | ICD-10-CM

## 2016-04-12 DIAGNOSIS — E559 Vitamin D deficiency, unspecified: Secondary | ICD-10-CM | POA: Diagnosis not present

## 2016-04-12 DIAGNOSIS — R7301 Impaired fasting glucose: Secondary | ICD-10-CM

## 2016-04-12 DIAGNOSIS — E7800 Pure hypercholesterolemia, unspecified: Secondary | ICD-10-CM | POA: Diagnosis not present

## 2016-04-12 LAB — LIPID PANEL
Cholesterol: 208 mg/dL — ABNORMAL HIGH (ref 125–200)
HDL: 62 mg/dL (ref >=46)
LDL CALC: 136 mg/dL — AB (ref <130)
Total CHOL/HDL Ratio: 3.4 Ratio (ref <=5.0)
Triglycerides: 51 mg/dL (ref <150)
VLDL: 10 mg/dL (ref <30)

## 2016-04-12 LAB — COMPREHENSIVE METABOLIC PANEL
ALBUMIN: 4 g/dL (ref 3.6–5.1)
ALT: 12 U/L (ref 6–29)
AST: 17 U/L (ref 10–35)
Alkaline Phosphatase: 79 U/L (ref 33–130)
BILIRUBIN TOTAL: 0.7 mg/dL (ref 0.2–1.2)
BUN: 16 mg/dL (ref 7–25)
CHLORIDE: 102 mmol/L (ref 98–110)
CO2: 26 mmol/L (ref 20–31)
CREATININE: 1.05 mg/dL (ref 0.50–1.05)
Calcium: 9.7 mg/dL (ref 8.6–10.4)
Glucose, Bld: 104 mg/dL — ABNORMAL HIGH (ref 65–99)
POTASSIUM: 4 mmol/L (ref 3.5–5.3)
SODIUM: 139 mmol/L (ref 135–146)
Total Protein: 6.5 g/dL (ref 6.1–8.1)

## 2016-04-12 LAB — VITAMIN D 25 HYDROXY (VIT D DEFICIENCY, FRACTURES): Vit D, 25-Hydroxy: 37 ng/mL (ref 30–100)

## 2016-04-12 LAB — HEPATITIS C ANTIBODY: HCV AB: NEGATIVE

## 2016-04-12 MED ORDER — LOSARTAN POTASSIUM-HCTZ 100-25 MG PO TABS: 1.0000 | ORAL_TABLET | Freq: Every day | ORAL | Status: DC

## 2016-04-12 MED ORDER — AMLODIPINE BESYLATE 10 MG PO TABS: 10.0000 mg | ORAL_TABLET | Freq: Every day | ORAL | Status: DC

## 2016-04-12 NOTE — Progress Notes (Signed)
Chief Complaint  Patient presents with  . Hypertension    nonfasting med check, labs already done.    Impaired fasting glucose-- She has been exercising [employer] [place]) for an hour twice a week, plus watching active grandkids. She got off weight watchers after her father-in-law's death (see last visit), but plans to get back to it soon.  There has still been a lot of stress at home (husband has been angry/depressed; improving slowly).  Hypertension follow-up: Blood pressures elsewhere are 120-128/70's. Denies dizziness, headaches (just related to work/stress), chest pain. Denies side effects of medications.  Hyperlipidemia--She cut out alfredo sauce and pizza (very rare).  Has red meat 3x/week.  She doesn't eat eggs, tries to limit cheese, just on salads (and tacos, small amount).  She has been working on her diet, has never been on cholesterol medications.  Had labs drawn prior to today's visit.  Anxiety follow-up: Moods are good, anxiety is controlled. Her son's moods have been well controlled, he is doing well. She currently denies any depressive symptoms. She has stress related to work schedule, as well as dealing with her husband's stress/moods related to the loss of his dad and interactions with his family. Denies any side effects to the medication.  Vitamin D deficiency:She had labs rechecked prior to today's visit.  She has been taking 2000 IU OTC Vitamin D daily since the last prescription course.  Had colonoscopy in 06/2015; admits to being past due for GYN and mammogram and plans to schedule.  Immunization History  Administered Date(s) Administered  . Influenza,inj,Quad PF,36+ Mos 10/10/2012, 08/19/2014, 09/08/2015   She reports she had Hep A #1 and Td prior to a trip to [place] about 10 years ago.  PMH, [place] SH reviewed, no FH changes.  Outpatient Encounter Prescriptions as of 04/12/2016  Medication Sig  . amLODipine (NORVASC) 10 MG tablet TAKE 1 TABLET BY MOUTH EVERY DAY  .  cholecalciferol (VITAMIN D) 1000 units tablet Take 2,000 Units by mouth daily.  . citalopram (CELEXA) 40 MG tablet TAKE 0.5 TABLETS (20 MG TOTAL) BY MOUTH DAILY.  Marland Kitchen losartan-hydrochlorothiazide (HYZAAR) 100-25 MG tablet TAKE 1 TABLET BY MOUTH EVERY DAY  . [DISCONTINUED] azithromycin (ZITHROMAX) 250 MG tablet Take 2 tablets by mouth on first day, then 1 tablet by mouth on days 2 through 5  . [DISCONTINUED] fluticasone (FLONASE) 50 MCG/ACT nasal spray Place 2 sprays into both nostrils daily. (Patient not taking: Reported on 01/17/2016)  . [DISCONTINUED] predniSONE (DELTASONE) 20 MG tablet Take 1 tablet (20 mg total) by mouth 2 (two) times daily with a meal.   No facility-administered encounter medications on file as of 04/12/2016.    No Known Allergies  ROS: no fever, chills, dizziness, chest pain, shortness of breath, URI or allergy symptoms, cough, nausea, vomiting, bowel changes, bleeding, bruising, rash, joint pains, depression, anxiety, GU or GI complaints.  See HPI  PHYSICAL EXAM:  BP 128/78 mmHg  Pulse 80  Ht 5\' 7"  (1.702 m)  Wt 198 lb 3.2 oz (89.903 kg)  BMI 31.04 kg/m2  Well developed, pleasant female, in good spirits, accompanied by two active grandchildren HEENT: PERRL, EOMI, conjunctiva and sclera clear Neck: no lymphadenopathy, thyromegaly or carotid bruit Heart: regular rate and rhythm without murmur Lungs: clear bilaterally Back: no CVA or spinal tenderness Abdomen: soft, nontender, no organomegaly or mass Extremities: no edema, 2+ pulses Skin: normal turgor, no rashes/lesions Neuro: alert and oriented, cranial nerves intact, normal gait, strength Psych: normal mood, affect, hygiene and grooming, normal eye contact  and speech   Lab Results  Component Value Date   HGBA1C 5.4 04/11/2016     Chemistry      Component Value Date/Time   NA 139 04/11/2016 0732   K 4.0 04/11/2016 0732   CL 102 04/11/2016 0732   CO2 26 04/11/2016 0732   BUN 16 04/11/2016 0732    CREATININE 1.05 04/11/2016 0732      Component Value Date/Time   CALCIUM 9.7 04/11/2016 0732   ALKPHOS 79 04/11/2016 0732   AST 17 04/11/2016 0732   ALT 12 04/11/2016 0732   BILITOT 0.7 04/11/2016 0732     Fasting glucose 104  Lab Results  Component Value Date   CHOL 208* 04/11/2016   HDL 62 04/11/2016   LDLCALC 136* 04/11/2016   TRIG 51 04/11/2016   CHOLHDL 3.4 04/11/2016   Vitamin D-OH 37  Lab Results  Component Value Date   TSH 3.18 04/11/2016   Negative Hepatitis C Antibody  ASSESSMENT/PLAN:  Essential (primary) hypertension - well controlled - Plan: amLODipine (NORVASC) 10 MG tablet, losartan-hydrochlorothiazide (HYZAAR) 100-25 MG tablet  Immunization due - Plan: Tdap vaccine greater than or equal to 7yo IM, Hepatitis A vaccine adult IM  Anxiety disorder, unspecified anxiety disorder type - controlled; continue citalopram  Vitamin D deficiency - resolved; continue 2000 IU of Vitamin D3 daily  Impaired fasting glucose - reviewed diet, encouraged weight loss, regular exercise  Pure hypercholesterolemia - improved with dietary changes.  LDL is borderline, good HDL, ratio ok. Continue dietary changes   TdaP and Hep A #2 today.    Please schedule your GYN exam and mammogram. Continue to follow a low cholesterol diet (try eating more seafood (not fried), shrimp, poultry rather than red meat).  Continue regular exercise and get back on Weight Watchers to continue your weight loss. Continue your current medications.  F/u 6 months Future orders entered for A1c, glu, Vitamin D and lipids F/u 6 months

## 2016-04-12 NOTE — Patient Instructions (Signed)
  Please schedule your GYN exam and mammogram. Continue to follow a low cholesterol diet (try eating more seafood (not fried), shrimp, poultry rather than red meat).  Continue regular exercise and get back on Weight Watchers to continue your weight loss. Continue your current medications.

## 2016-04-24 ENCOUNTER — Ambulatory Visit (INDEPENDENT_AMBULATORY_CARE_PROVIDER_SITE_OTHER): Payer: 59 | Admitting: Family Medicine

## 2016-04-24 ENCOUNTER — Encounter: Payer: Self-pay | Admitting: Family Medicine

## 2016-04-24 VITALS — BP 116/76 | HR 72 | Ht 67.0 in | Wt 195.8 lb

## 2016-04-24 DIAGNOSIS — S8012XA Contusion of left lower leg, initial encounter: Secondary | ICD-10-CM

## 2016-04-24 NOTE — Patient Instructions (Signed)
Hematoma  A hematoma is a collection of blood under the skin, in an organ, in a body space, in a joint space, or in other tissue. The blood can clot to form a lump that you can see and feel. The lump is often firm and may sometimes become sore and tender. Most hematomas get better in a few days to weeks. However, some hematomas may be serious and require medical care. Hematomas can range in size from very small to very large.  CAUSES   A hematoma can be caused by a blunt or penetrating injury. It can also be caused by spontaneous leakage from a blood vessel under the skin. Spontaneous leakage from a blood vessel is more likely to occur in older people, especially those taking blood thinners. Sometimes, a hematoma can develop after certain medical procedures.  SIGNS AND SYMPTOMS   · A firm lump on the body.  · Possible pain and tenderness in the area.  · Bruising. Blue, dark blue, purple-red, or yellowish skin may appear at the site of the hematoma if the hematoma is close to the surface of the skin.  For hematomas in deeper tissues or body spaces, the signs and symptoms may be subtle. For example, an intra-abdominal hematoma may cause abdominal pain, weakness, fainting, and shortness of breath. An intracranial hematoma may cause a headache or symptoms such as weakness, trouble speaking, or a change in consciousness.  DIAGNOSIS   A hematoma can usually be diagnosed based on your medical history and a physical exam. Imaging tests may be needed if your health care provider suspects a hematoma in deeper tissues or body spaces, such as the abdomen, head, or chest. These tests may include ultrasonography or a CT scan.   TREATMENT   Hematomas usually go away on their own over time. Rarely does the blood need to be drained out of the body. Large hematomas or those that may affect vital organs will sometimes need surgical drainage or monitoring.  HOME CARE INSTRUCTIONS   · Apply ice to the injured area:      Put ice in a  plastic bag.      Place a towel between your skin and the bag.      Leave the ice on for 20 minutes, 2-3 times a day for the first 1 to 2 days.    · After the first 2 days, switch to using warm compresses on the hematoma.    · Elevate the injured area to help decrease pain and swelling. Wrapping the area with an elastic bandage may also be helpful. Compression helps to reduce swelling and promotes shrinking of the hematoma. Make sure the bandage is not wrapped too tight.    · If your hematoma is on a lower extremity and is painful, crutches may be helpful for a couple days.    · Only take over-the-counter or prescription medicines as directed by your health care provider.  SEEK IMMEDIATE MEDICAL CARE IF:   · You have increasing pain, or your pain is not controlled with medicine.    · You have a fever.    · You have worsening swelling or discoloration.    · Your skin over the hematoma breaks or starts bleeding.    · Your hematoma is in your chest or abdomen and you have weakness, shortness of breath, or a change in consciousness.  · Your hematoma is on your scalp (caused by a fall or injury) and you have a worsening headache or a change in alertness or consciousness.  MAKE SURE YOU:   ·   Understand these instructions.  · Will watch your condition.  · Will get help right away if you are not doing well or get worse.     This information is not intended to replace advice given to you by your health care provider. Make sure you discuss any questions you have with your health care provider.     Document Released: 05/16/2004 Document Revised: 06/04/2013 Document Reviewed: 03/12/2013  Elsevier Interactive Patient Education ©2016 Elsevier Inc[employer]

## 2016-04-24 NOTE — Progress Notes (Signed)
Chief Complaint  Patient presents with  . Bleeding/Bruising    left leg has multiple bruises on her left leg due to [place], woke up and her left foot is also bruised and she is not sure why.   Shortly after her last visit, while doing [place] (sparring session on 04/14/16), she had multiple kicks to the left lower leg.  There were at least 3 areas of direct contact.  The one on the medial portion of the mid-calf hurt the most, and is still quite painful.  The areas have been changing in color--lightening up, now to yellow-green, but on both sides of the foot she noticed new bruising.  These new areas were noticed last week, the medial portion first, followed by the lateral bruising.  The one medially was purple at first, is now a lighter pink, and noticed green/bruising at the medial ankle.  No significant change to the bruising laterally.  These areas are not painful to touch.   Date of injury was 04/14/16.  She had significant swelling of her ankle and foot last week (4 days ago).  She has been using ice packs to the foot, keeping it elevated, and the swelling of the foot and ankle resolved.  The swelling lasted 2-3 days, better now.   No pain with weight-bearing. She was wearing shin guards at the time.  She does not take aspirin   PMH, PSH, SH reviewed  Outpatient Encounter Prescriptions as of 04/24/2016  Medication Sig  . amLODipine (NORVASC) 10 MG tablet Take 1 tablet (10 mg total) by mouth daily.  . cholecalciferol (VITAMIN D) 1000 units tablet Take 2,000 Units by mouth daily.  . citalopram (CELEXA) 40 MG tablet TAKE 0.5 TABLETS (20 MG TOTAL) BY MOUTH DAILY.  Marland Kitchen losartan-hydrochlorothiazide (HYZAAR) 100-25 MG tablet Take 1 tablet by mouth daily.   No facility-administered encounter medications on file as of 04/24/2016.   No Known Allergies  ROS: Denies numbness or tingling into the foot.  Swelling has resolved. No fever, chills, URI symptoms, chest pain, shortness of breath.  No  bleeding. See HPI  PHYSICAL EXAM: BP 116/76 mmHg  Pulse 72  Ht 5\' 7"  (1.702 m)  Wt 195 lb 12.8 oz (88.814 kg)  BMI 30.66 kg/m2  Well developed, pleasant female, in no distress.  She is in good spirits, reports coming in because her daughter/husband made her.  Left leg: There is extensive bruising throughout the lower leg (not posteriorly) There is a purple and yellow bruise located just inferomedial to the kneecap, measuring 6.5x6.5 cm.  In the superomedial portion (where it is still dark) there is about a 1-1.5cm firmness below, which is tender to touch.  7 x 7.5 cm bruise noted laterally and extending slightly posteriorly at the upper leg. Somewhat tear-drop shape with central clearing.  There is a 1-1.5 cm tender firmness in the superior and anterior portion that is tender.  There is diffuse light yellow discoloration along the anterior leg, extending medially.  She has slight tenderness over the tibia along the mid portion of the bone--no bony stepoff or swelling.  There is some darker bruising medially, that also has firmness and tenderness to palpation at the inferior portion of this large bruise.  This raised, tender area measures 3-4 cm in size.  No overlying warmth or redness to any of these tender areas.  There is light yellow-green bruising at the medial malleolus, and inferior to this, there is darker pink/purple bruising.  This area is nontender.  Similar purple bruising on the lateral foot (starting well below the lateral malleolus), nontender.  ASSESSMENT/PLAN:  Contusion of leg, left, initial encounter  Hematoma of leg, left, initial encounter   Multiple contusions to the left lower leg, with a few small hematomas.  The new bruising at the foot that is nontender is likely from tracking of the blood from gravity, and has already started to lighten some medially.  Discussed in future the use of ice (to the bruised areas--she only used where the foot was swollen)  immediately. Now, for the hematomas, recommend use of heat. Reassured. If ongoing bony tenderness, consider x-ray of tibia, but suspect just contusion.

## 2016-09-20 ENCOUNTER — Other Ambulatory Visit: Payer: Self-pay | Admitting: Family Medicine

## 2016-09-20 DIAGNOSIS — F419 Anxiety disorder, unspecified: Secondary | ICD-10-CM

## 2016-09-23 IMAGING — CR DG CHEST 2V
3 series · 3 of 3 positions shown · non-contrast
Comparison: Chest x-ray of November 26, 2010

CLINICAL DATA: One week history of productive cough without fever ;
abnormal chest exam to auscultation ; no history of smoking

EXAM:
CHEST  2 VIEW

[view not recorded (1 of 3)]
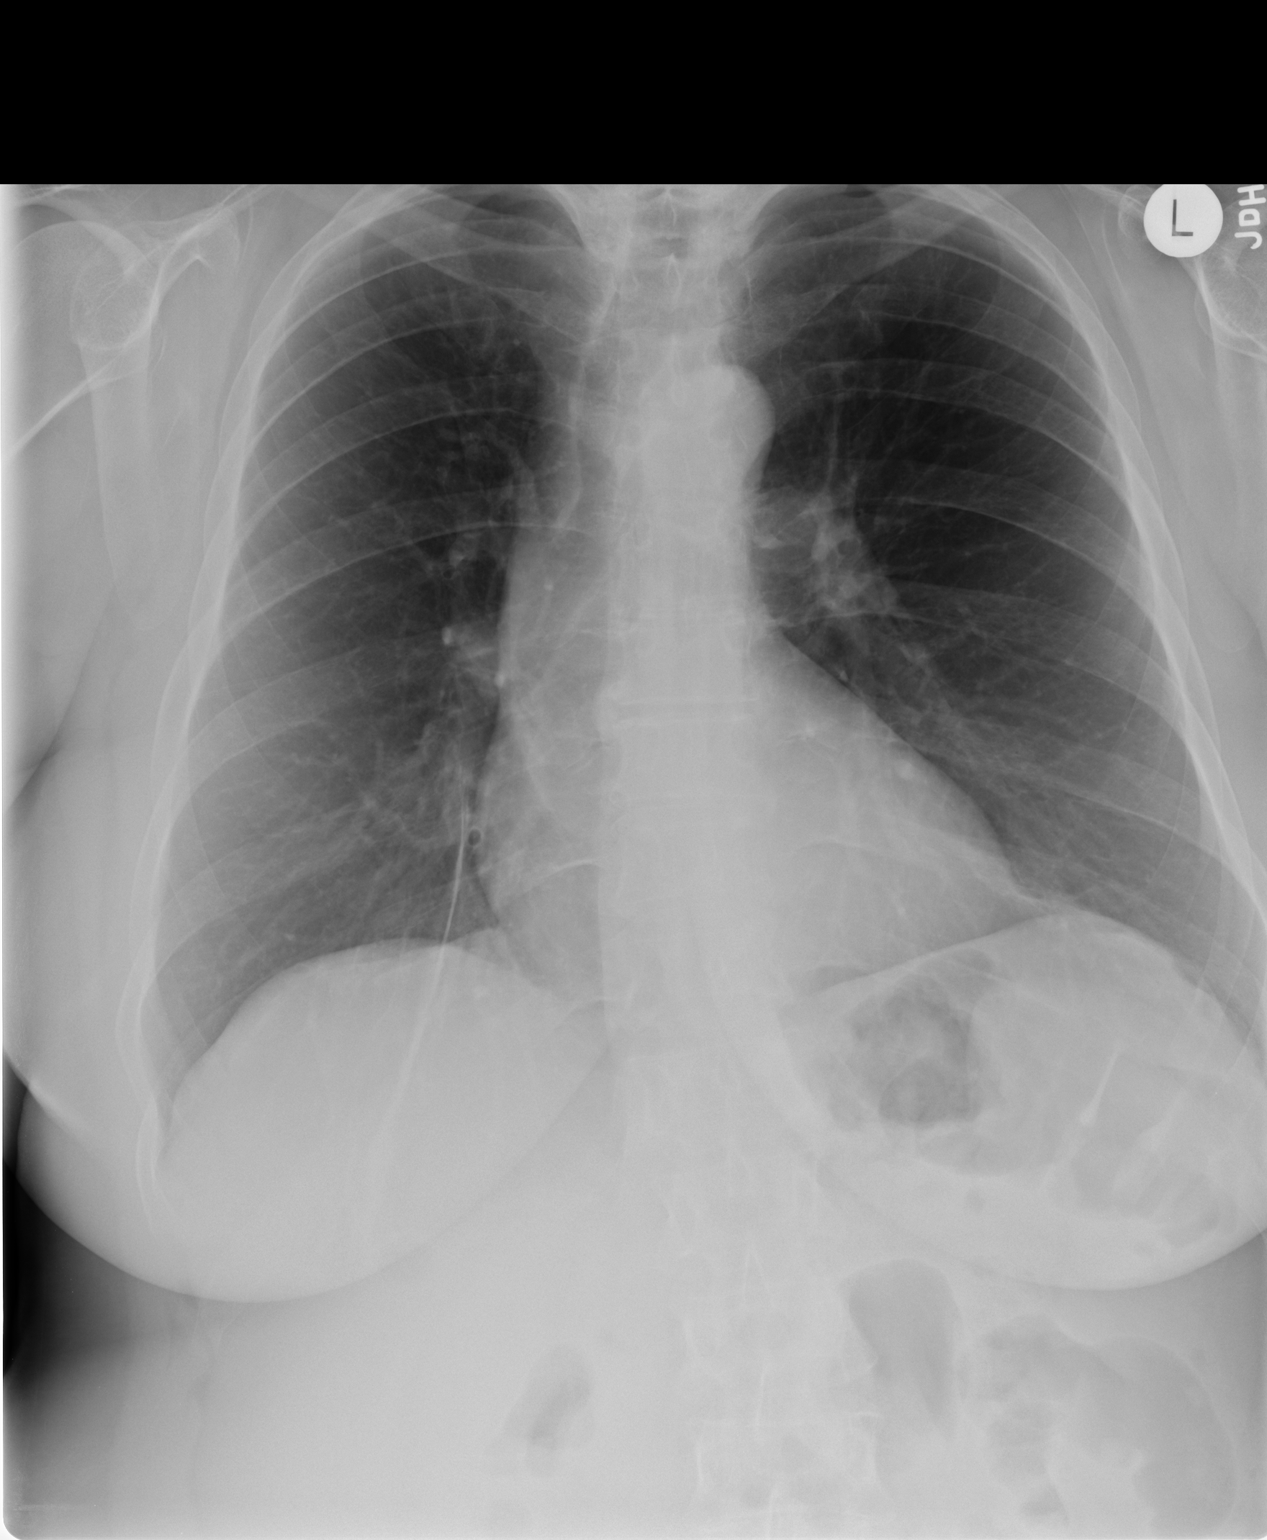

[view not recorded (2 of 3)]
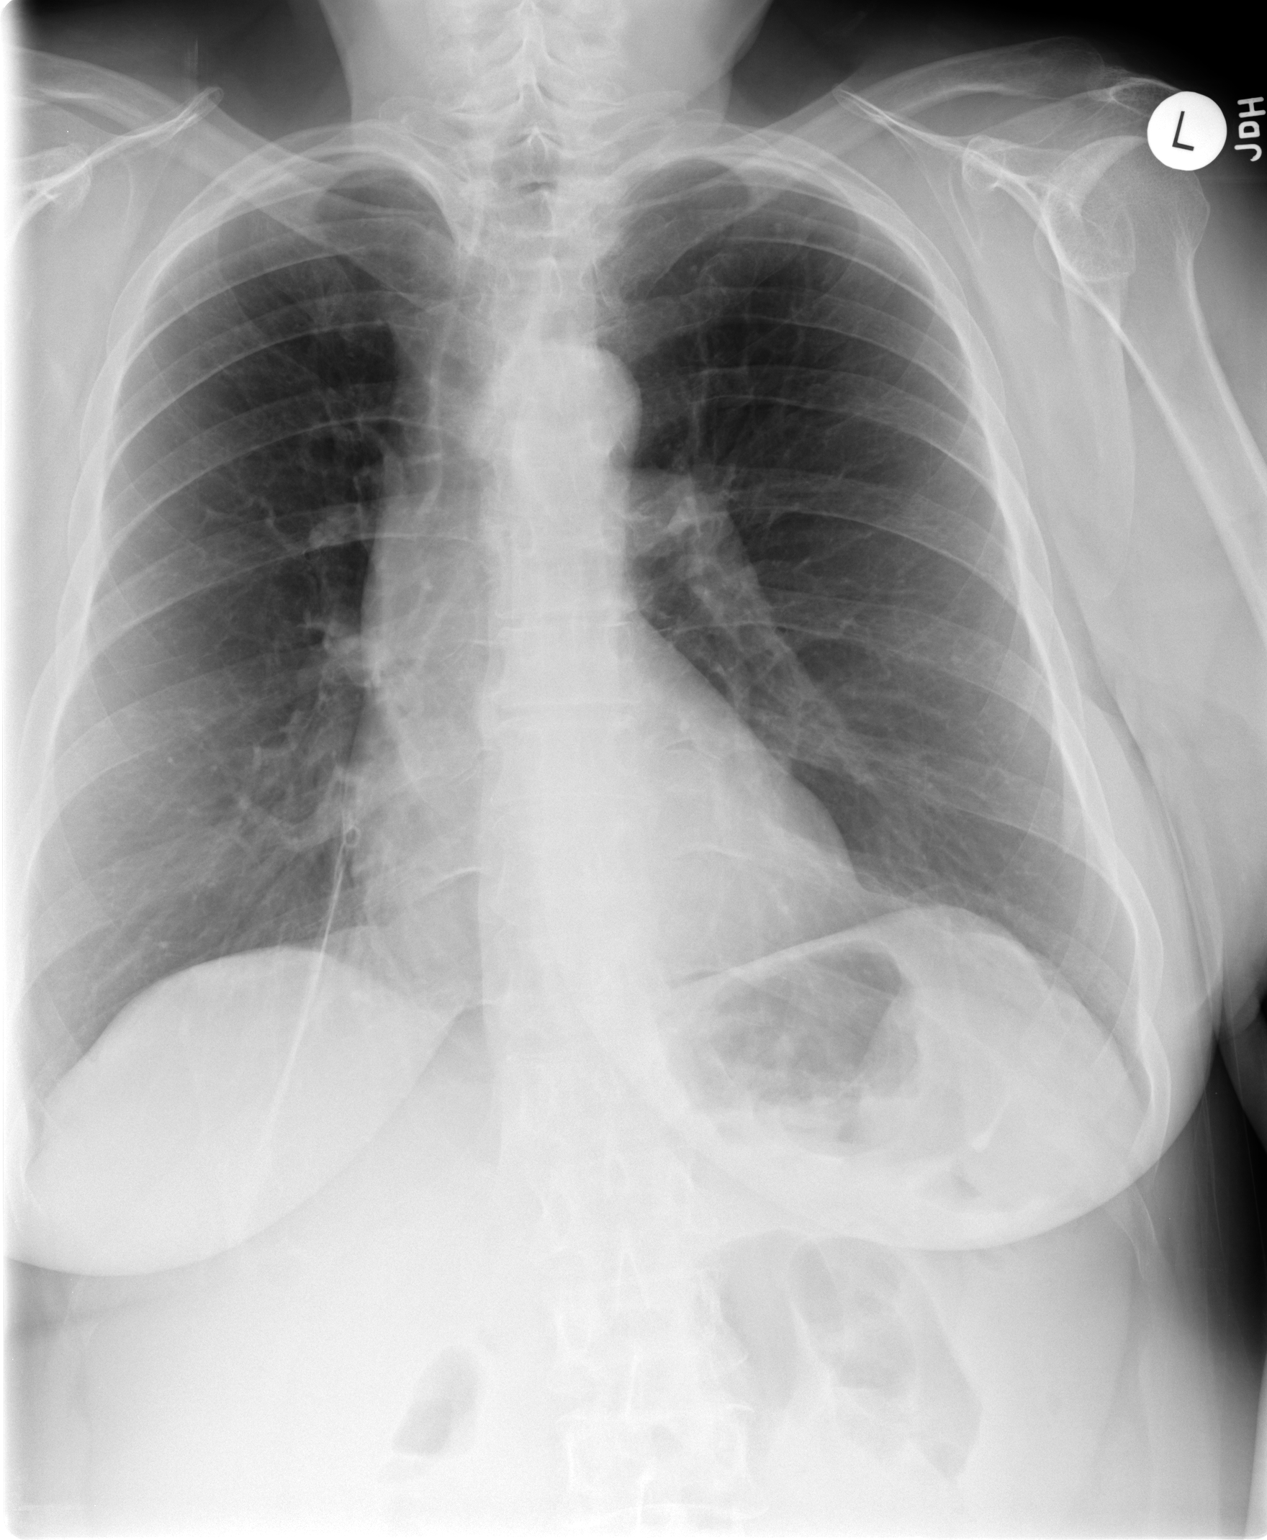

[view not recorded (3 of 3)]
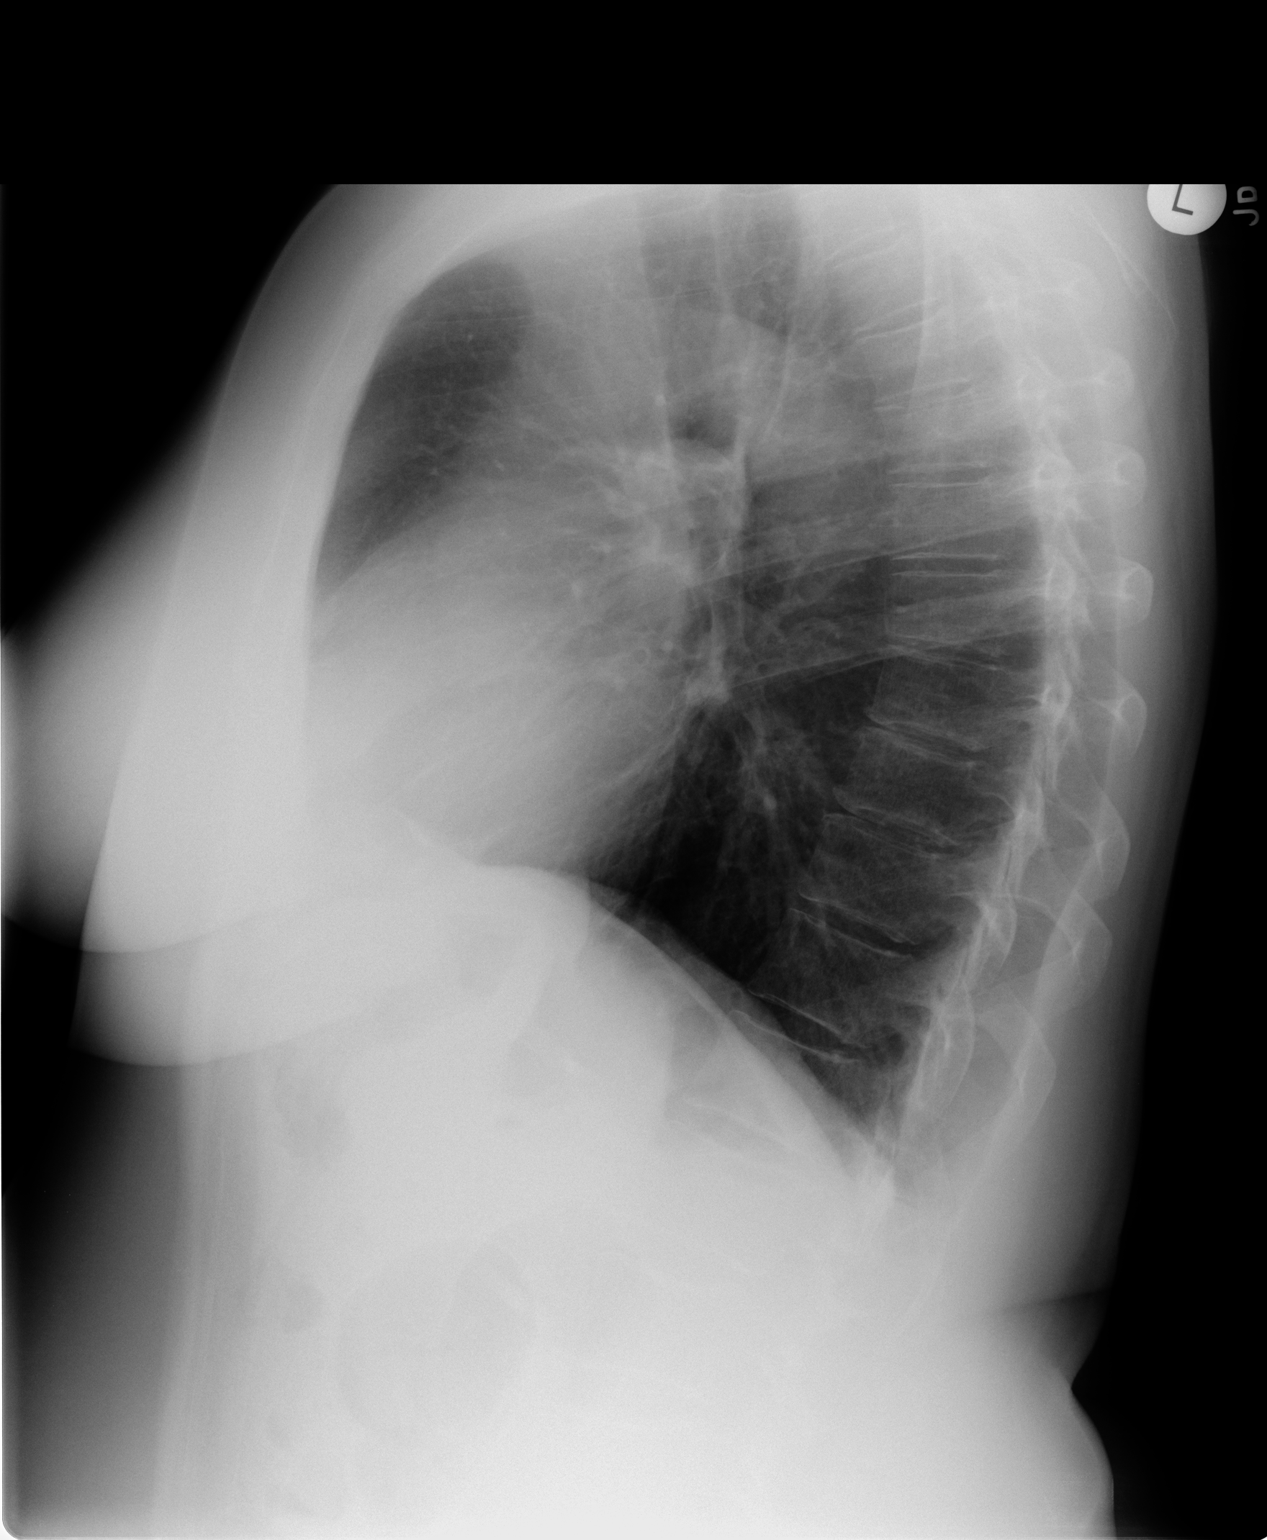

[3 of 3 positions shown; findings below may reference images not displayed]

FINDINGS: The lungs are adequately inflated. There is no focal infiltrate.
There is no pleural effusion or pneumothorax. The heart and
mediastinal structures are normal. The bony thorax is unremarkable.
IMPRESSION: There is no active cardiopulmonary disease.

## 2016-10-16 NOTE — Progress Notes (Signed)
Chief Complaint  Patient presents with  . Hypertension    nonfasting med check, labs already done.    Hypertension follow-up: Blood pressure isn't checked often--recalls it being 130/70's when last checked.  Denies dizziness, headaches (just related to work/stress, occasional sinus), chest pain. Denies side effects of medications.  Impaired fasting glucose-- A1c's have been normal over the last year. Last check was 5.4 in June.  She hasn't been exercising much in the last six months  (previously was doing [place] for an hour twice a week);l continues to watch active grandkids. She hasn't been on Weight Watchers, but plans to restart soon.   She pulled something in her shoulder when she was re-doing her bathroom.  Having some hip problems lately, so hasn't been exercising as much (also has been sick). She plans to restart a more regular exercise routine next week.  Hyperlipidemia--She cut out alfredo sauce and pizza (very rare).  Has red meat 3x/week.  She doesn't eat eggs, tries to limit cheese, just on salads (and tacos, small amount).  She has been working on her diet, has never been on cholesterol medications.  Last lipids: Lab Results  Component Value Date   CHOL 208 (H) 04/11/2016   HDL 62 04/11/2016   LDLCALC 136 (H) 04/11/2016   TRIG 51 04/11/2016   CHOLHDL 3.4 04/11/2016   Anxiety follow-up: Moods are good, anxiety is controlled. Her son's moods have been well controlled, he is doing well. She currently denies any depressive symptoms. She has stress related to work schedule, as well as dealing with her husband's stress/moods related to the loss of his dad and interactions with his family. They had to put down father-in-law's dog yesterday, which made it for a rough night for him yesterday. Denies any side effects to the medication.  Vitamin D deficiency: She has been taking 2000 IU OTC Vitamin D daily since the last prescription course. Last level was 37 in June 2017. She had  labs drawn yesterday--see below.  She is having a lot more heartburn lately. She is using zantac prn, needing it more frequently. Denies change in diet. She plans to f/u with GI to see if another endoscopy is needed. She intermittently has problems where things feel like it gets stuck in her chest and she needs to vomit.  Had colonoscopy in 06/2015; admits to being past due for GYN and mammogram and plans to schedule.  Immunization History  Administered Date(s) Administered  . Hepatitis A 08/16/2006  . Hepatitis A, Adult 04/12/2016  . Influenza,inj,Quad PF,36+ Mos 10/10/2012, 08/19/2014, 09/08/2015  . Tdap 04/12/2016    ROS:  Denies fever, chills, URI symptoms (had recent cold, improved).  No cough, shortness of breath, chest pain. Moods are good overall; +work stress. No dizziness, edema, bleeding, bruising, rash.  +hip pain, improving.  See HPI  PHYSICAL EXAM:  BP 118/72 (BP Location: Left Arm, Patient Position: Sitting, Cuff Size: Normal)   Pulse 76   Ht '5\' 7"'  (1.702 m)   Wt 196 lb 9.6 oz (89.2 kg)   BMI 30.79 kg/m   Well developed, pleasant female, in good spirits HEENT: PERRL, EOMI, conjunctiva and sclera clear Neck: no lymphadenopathy, thyromegaly or carotid bruit Heart: regular rate and rhythm without murmur Lungs: clear bilaterally Back: no CVA or spinal tenderness Abdomen: soft, nontender, no organomegaly or mass Extremities: no edema, 2+ pulses Skin: normal turgor, no rashes/lesions Neuro: alert and oriented, cranial nerves intact, normal gait, strength Psych: normal mood, affect, hygiene and grooming, normal  eye contact and speech  Lab Results  Component Value Date   HGBA1C 5.3 10/17/2016   Fasting glucose 112  Vitamin D-OH 35  Lab Results  Component Value Date   CHOL 188 10/17/2016   HDL 55 10/17/2016   LDLCALC 124 (H) 10/17/2016   TRIG 47 10/17/2016   CHOLHDL 3.4 10/17/2016    ASSESSMENT/PLAN:  Essential (primary) hypertension - well controlled -  Plan: amLODipine (NORVASC) 10 MG tablet, losartan-hydrochlorothiazide (HYZAAR) 100-25 MG tablet  Pure hypercholesterolemia - LDL improved with diet; some drop in HDL (due to decrease in exercise), but overall still fine, chol/HDL ratio <4  Impaired fasting glucose - fasting sugar remains elevated, but normal A1c; encouraged weight loss, exercise, proper diet  Vitamin D deficiency - resolved; continue daily supplement  Anxiety disorder, unspecified type - well controlled  Need for prophylactic vaccination and inoculation against influenza - Plan: Flu Vaccine QUAD 36+ mos PF IM (Fluarix & Fluzone Quad PF)  Gastroesophageal reflux disease, esophagitis presence not specified - reviewed diet, meds. Prefers H2 blocker over PPI; encouraged BID use for up to 2-4 wks; f/u with GI if persistent dysphagia    Shingrix recommended when available (to check with her insurance) Flu shot given today   GERD precautions reviewed. Consider trial of zantac 129m BID x 2 weeks. F/u with GI if persistent dysphagia.  Please schedule your GYN exam and mammogram.  Refilled x 6 mos Okay to wait a year for f/u if doing well Will need labs prior to next visit--c-met, lipids, Vit D, A1c, TSH, CBC

## 2016-10-17 ENCOUNTER — Other Ambulatory Visit: Payer: 59

## 2016-10-17 DIAGNOSIS — R7301 Impaired fasting glucose: Secondary | ICD-10-CM

## 2016-10-17 DIAGNOSIS — E559 Vitamin D deficiency, unspecified: Secondary | ICD-10-CM

## 2016-10-17 DIAGNOSIS — E7800 Pure hypercholesterolemia, unspecified: Secondary | ICD-10-CM

## 2016-10-18 ENCOUNTER — Encounter: Payer: Self-pay | Admitting: Family Medicine

## 2016-10-18 ENCOUNTER — Ambulatory Visit (INDEPENDENT_AMBULATORY_CARE_PROVIDER_SITE_OTHER): Payer: 59 | Admitting: Family Medicine

## 2016-10-18 VITALS — BP 118/72 | HR 76 | Ht 67.0 in | Wt 196.6 lb

## 2016-10-18 DIAGNOSIS — K219 Gastro-esophageal reflux disease without esophagitis: Secondary | ICD-10-CM

## 2016-10-18 DIAGNOSIS — Z5181 Encounter for therapeutic drug level monitoring: Secondary | ICD-10-CM

## 2016-10-18 DIAGNOSIS — R7301 Impaired fasting glucose: Secondary | ICD-10-CM

## 2016-10-18 DIAGNOSIS — I10 Essential (primary) hypertension: Secondary | ICD-10-CM | POA: Diagnosis not present

## 2016-10-18 DIAGNOSIS — E559 Vitamin D deficiency, unspecified: Secondary | ICD-10-CM | POA: Diagnosis not present

## 2016-10-18 DIAGNOSIS — R5383 Other fatigue: Secondary | ICD-10-CM

## 2016-10-18 DIAGNOSIS — E7800 Pure hypercholesterolemia, unspecified: Secondary | ICD-10-CM | POA: Diagnosis not present

## 2016-10-18 DIAGNOSIS — Z23 Encounter for immunization: Secondary | ICD-10-CM | POA: Diagnosis not present

## 2016-10-18 DIAGNOSIS — F419 Anxiety disorder, unspecified: Secondary | ICD-10-CM

## 2016-10-18 LAB — LIPID PANEL
CHOLESTEROL: 188 mg/dL (ref <200)
HDL: 55 mg/dL (ref >50)
LDL Cholesterol: 124 mg/dL — ABNORMAL HIGH (ref <100)
TRIGLYCERIDES: 47 mg/dL (ref <150)
Total CHOL/HDL Ratio: 3.4 Ratio (ref <5.0)
VLDL: 9 mg/dL (ref <30)

## 2016-10-18 LAB — VITAMIN D 25 HYDROXY (VIT D DEFICIENCY, FRACTURES): Vit D, 25-Hydroxy: 35 ng/mL (ref 30–100)

## 2016-10-18 LAB — HEMOGLOBIN A1C
HEMOGLOBIN A1C: 5.3 % (ref <5.7)
MEAN PLASMA GLUCOSE: 105 mg/dL

## 2016-10-18 LAB — GLUCOSE, RANDOM: Glucose, Bld: 112 mg/dL — ABNORMAL HIGH (ref 65–99)

## 2016-10-18 MED ORDER — AMLODIPINE BESYLATE 10 MG PO TABS: 10.0000 mg | ORAL_TABLET | Freq: Every day | ORAL | 5 refills | Status: DC

## 2016-10-18 MED ORDER — LOSARTAN POTASSIUM-HCTZ 100-25 MG PO TABS: 1.0000 | ORAL_TABLET | Freq: Every day | ORAL | 5 refills | Status: DC

## 2016-10-18 NOTE — Patient Instructions (Addendum)
Your blood pressure was well controlled. Continue your current medications. Your cholesterol was improved--continue low fat, low cholesterol diet. Your sugar was elevated, but overall remains good (not diabetic). Continue to limit your carbs, sugar. Try and get back into a regular exercise routine and lose a little weight.  Your vitamin D level is now normal. Continue to take 2000 IU every day.  I recommend getting the new shingles vaccine (Shingrix) when available. You will need to check with your insurance to see if it is covered.  It is a series of 2 injections, spaced 2 months apart.  Please schedule your mammogram, and routine GYN visit.   Food Choices for Gastroesophageal Reflux Disease, Adult When you have gastroesophageal reflux disease (GERD), the foods you eat and your eating habits are very important. Choosing the right foods can help ease your discomfort. What guidelines do I need to follow?  Choose fruits, vegetables, whole grains, and low-fat dairy products.  Choose low-fat meat, fish, and poultry.  Limit fats such as oils, salad dressings, butter, nuts, and avocado.  Keep a food diary. This helps you identify foods that cause symptoms.  Avoid foods that cause symptoms. These may be different for everyone.  Eat small meals often instead of 3 large meals a day.  Eat your meals slowly, in a place where you are relaxed.  Limit fried foods.  Cook foods using methods other than frying.  Avoid drinking alcohol.  Avoid drinking large amounts of liquids with your meals.  Avoid bending over or lying down until 2-3 hours after eating. What foods are not recommended? These are some foods and drinks that may make your symptoms worse: Vegetables  Tomatoes. Tomato juice. Tomato and spaghetti sauce. Chili peppers. Onion and garlic. Horseradish. Fruits  Oranges, grapefruit, and lemon (fruit and juice). Meats  High-fat meats, fish, and poultry. This includes hot dogs, ribs,  ham, sausage, salami, and bacon. Dairy  Whole milk and chocolate milk. Sour cream. Cream. Butter. Ice cream. Cream cheese. Drinks  Coffee and tea. Bubbly (carbonated) drinks or energy drinks. Condiments  Hot sauce. Barbecue sauce. Sweets/Desserts  Chocolate and cocoa. Donuts. Peppermint and spearmint. Fats and Oils  High-fat foods. This includes [place] fries and potato chips. Other  Vinegar. Strong spices. This includes black pepper, white pepper, red pepper, cayenne, curry powder, cloves, ginger, and chili powder. The items listed above may not be a complete list of foods and drinks to avoid. Contact your dietitian for more information.  This information is not intended to replace advice given to you by your health care provider. Make sure you discuss any questions you have with your health care provider. Document Released: 04/02/2012 Document Revised: 03/09/2016 Document Reviewed: 08/06/2013 Elsevier Interactive Patient Education  2017 [employer].  Consider using zantac 150mg  twice daily for a couple of weeks, to see if that calms things down at all (along with any dietary changes).  If you have trouble swallowing, be sure to follow up with your gastroenterologist.

## 2017-04-09 ENCOUNTER — Other Ambulatory Visit: Payer: Self-pay | Admitting: Gynecology

## 2017-04-09 DIAGNOSIS — Z1231 Encounter for screening mammogram for malignant neoplasm of breast: Secondary | ICD-10-CM

## 2017-04-10 ENCOUNTER — Other Ambulatory Visit: Payer: Self-pay | Admitting: Family Medicine

## 2017-04-10 ENCOUNTER — Ambulatory Visit
Admission: RE | Admit: 2017-04-10 | Discharge: 2017-04-10 | Disposition: A | Discharge status: Home / Self Care | Payer: 59 | Source: Ambulatory Visit | Attending: Gynecology | Admitting: Gynecology

## 2017-04-10 DIAGNOSIS — Z1231 Encounter for screening mammogram for malignant neoplasm of breast: Secondary | ICD-10-CM

## 2017-04-10 DIAGNOSIS — F419 Anxiety disorder, unspecified: Secondary | ICD-10-CM

## 2017-04-10 NOTE — Telephone Encounter (Signed)
Can this pt have a refill on this ? 

## 2017-04-11 DIAGNOSIS — H52223 Regular astigmatism, bilateral: Secondary | ICD-10-CM | POA: Diagnosis not present

## 2017-04-11 DIAGNOSIS — H5213 Myopia, bilateral: Secondary | ICD-10-CM | POA: Diagnosis not present

## 2017-04-11 DIAGNOSIS — H524 Presbyopia: Secondary | ICD-10-CM | POA: Diagnosis not present

## 2017-04-12 ENCOUNTER — Other Ambulatory Visit: Payer: Self-pay | Admitting: Gynecology

## 2017-04-12 DIAGNOSIS — N644 Mastodynia: Secondary | ICD-10-CM

## 2017-04-13 ENCOUNTER — Telehealth: Payer: Self-pay | Admitting: Family Medicine

## 2017-04-13 NOTE — Telephone Encounter (Signed)
Patient called, she and Verdene Lennert have missed each others calls this week She is needing an order for dx mammogram with ultrasound and Verdene Lennert had left her a message asking who was requesting the order.. Pt states [facility] was requesting referral Dr. Tomi Bamberger is out of office for a week, there is an order in the system that was placed by Dr. Toney Rakes. Pt states that she has not seen him in 3 years, advised her that she can still use that order and schedule her appointment but to just Make sure that when she goes for her appointment that she request results be sent to Dr. Tomi Bamberger, her pcp Patient will schedule her appointment and call us back if she needs anything else from [place]

## 2017-04-17 ENCOUNTER — Ambulatory Visit
Admission: RE | Admit: 2017-04-17 | Discharge: 2017-04-17 | Disposition: A | Discharge status: Home / Self Care | Payer: 59 | Source: Ambulatory Visit | Attending: Gynecology | Admitting: Gynecology

## 2017-04-17 ENCOUNTER — Ambulatory Visit: Payer: 59

## 2017-04-17 DIAGNOSIS — N644 Mastodynia: Secondary | ICD-10-CM

## 2017-04-17 DIAGNOSIS — R928 Other abnormal and inconclusive findings on diagnostic imaging of breast: Secondary | ICD-10-CM | POA: Diagnosis not present

## 2017-05-01 DIAGNOSIS — R12 Heartburn: Secondary | ICD-10-CM | POA: Diagnosis not present

## 2017-05-19 ENCOUNTER — Other Ambulatory Visit: Payer: Self-pay | Admitting: Family Medicine

## 2017-05-19 DIAGNOSIS — I10 Essential (primary) hypertension: Secondary | ICD-10-CM

## 2017-05-24 DIAGNOSIS — R12 Heartburn: Secondary | ICD-10-CM | POA: Diagnosis not present

## 2017-05-24 DIAGNOSIS — K209 Esophagitis, unspecified: Secondary | ICD-10-CM | POA: Diagnosis not present

## 2017-05-24 DIAGNOSIS — K2930 Chronic superficial gastritis without bleeding: Secondary | ICD-10-CM | POA: Diagnosis not present

## 2017-05-24 HISTORY — PX: ESOPHAGOGASTRODUODENOSCOPY: SHX1529

## 2017-05-30 ENCOUNTER — Ambulatory Visit: Payer: 59 | Admitting: Gynecology

## 2017-06-07 DIAGNOSIS — K209 Esophagitis, unspecified: Secondary | ICD-10-CM | POA: Diagnosis not present

## 2017-06-26 ENCOUNTER — Encounter: Payer: Self-pay | Admitting: Family Medicine

## 2017-06-28 ENCOUNTER — Encounter: Payer: Self-pay | Admitting: Family Medicine

## 2017-06-29 ENCOUNTER — Encounter: Payer: Self-pay | Admitting: Family Medicine

## 2017-07-03 NOTE — Telephone Encounter (Signed)
This encounter was created in error - please disregard.

## 2017-07-17 ENCOUNTER — Other Ambulatory Visit: Payer: Self-pay | Admitting: Family Medicine

## 2017-07-17 DIAGNOSIS — I10 Essential (primary) hypertension: Secondary | ICD-10-CM

## 2017-07-30 ENCOUNTER — Other Ambulatory Visit: Payer: Self-pay | Admitting: Dermatology

## 2017-07-30 DIAGNOSIS — L570 Actinic keratosis: Secondary | ICD-10-CM | POA: Diagnosis not present

## 2017-07-30 DIAGNOSIS — L820 Inflamed seborrheic keratosis: Secondary | ICD-10-CM | POA: Diagnosis not present

## 2017-08-01 DIAGNOSIS — Z23 Encounter for immunization: Secondary | ICD-10-CM | POA: Diagnosis not present

## 2017-08-02 ENCOUNTER — Encounter: Payer: Self-pay | Admitting: Family Medicine

## 2017-08-08 DIAGNOSIS — H52223 Regular astigmatism, bilateral: Secondary | ICD-10-CM | POA: Diagnosis not present

## 2017-08-08 DIAGNOSIS — H5213 Myopia, bilateral: Secondary | ICD-10-CM | POA: Diagnosis not present

## 2017-08-08 DIAGNOSIS — H5319 Other subjective visual disturbances: Secondary | ICD-10-CM | POA: Diagnosis not present

## 2017-10-22 ENCOUNTER — Other Ambulatory Visit: Payer: 59

## 2017-10-22 DIAGNOSIS — E559 Vitamin D deficiency, unspecified: Secondary | ICD-10-CM

## 2017-10-22 DIAGNOSIS — Z5181 Encounter for therapeutic drug level monitoring: Secondary | ICD-10-CM

## 2017-10-22 DIAGNOSIS — R5383 Other fatigue: Secondary | ICD-10-CM

## 2017-10-22 DIAGNOSIS — R7301 Impaired fasting glucose: Secondary | ICD-10-CM | POA: Diagnosis not present

## 2017-10-22 DIAGNOSIS — E7800 Pure hypercholesterolemia, unspecified: Secondary | ICD-10-CM | POA: Diagnosis not present

## 2017-10-23 LAB — COMPREHENSIVE METABOLIC PANEL
AG Ratio: 1.6 (calc) (ref 1.0–2.5)
ALT: 14 U/L (ref 6–29)
AST: 16 U/L (ref 10–35)
Albumin: 4.2 g/dL (ref 3.6–5.1)
Alkaline phosphatase (APISO): 87 U/L (ref 33–130)
BUN/Creatinine Ratio: 23 (calc) — ABNORMAL HIGH (ref 6–22)
BUN: 24 mg/dL (ref 7–25)
CHLORIDE: 103 mmol/L (ref 98–110)
CO2: 28 mmol/L (ref 20–32)
CREATININE: 1.06 mg/dL — AB (ref 0.50–0.99)
Calcium: 9.9 mg/dL (ref 8.6–10.4)
GLOBULIN: 2.7 g/dL (ref 1.9–3.7)
Glucose, Bld: 100 mg/dL — ABNORMAL HIGH (ref 65–99)
Potassium: 4.1 mmol/L (ref 3.5–5.3)
Sodium: 140 mmol/L (ref 135–146)
Total Bilirubin: 0.6 mg/dL (ref 0.2–1.2)
Total Protein: 6.9 g/dL (ref 6.1–8.1)

## 2017-10-23 LAB — CBC WITH DIFFERENTIAL/PLATELET
BASOS ABS: 29 {cells}/uL (ref 0–200)
BASOS PCT: 0.9 %
EOS ABS: 150 {cells}/uL (ref 15–500)
Eosinophils Relative: 4.7 %
HCT: 38.4 % (ref 35.0–45.0)
HEMOGLOBIN: 13.3 g/dL (ref 11.7–15.5)
Lymphs Abs: 877 cells/uL (ref 850–3900)
MCH: 29.2 pg (ref 27.0–33.0)
MCHC: 34.6 g/dL (ref 32.0–36.0)
MCV: 84.4 fL (ref 80.0–100.0)
MPV: 9.4 fL (ref 7.5–12.5)
Monocytes Relative: 6.6 %
Neutro Abs: 1933 cells/uL (ref 1500–7800)
Neutrophils Relative %: 60.4 %
PLATELETS: 170 10*3/uL (ref 140–400)
RBC: 4.55 10*6/uL (ref 3.80–5.10)
RDW: 13.2 % (ref 11.0–15.0)
TOTAL LYMPHOCYTE: 27.4 %
WBC: 3.2 10*3/uL — ABNORMAL LOW (ref 3.8–10.8)
WBCMIX: 211 {cells}/uL (ref 200–950)

## 2017-10-23 LAB — HEMOGLOBIN A1C
EAG (MMOL/L): 6.3 (calc)
Hgb A1c MFr Bld: 5.6 % of total Hgb (ref <5.7)
MEAN PLASMA GLUCOSE: 114 (calc)

## 2017-10-23 LAB — LIPID PANEL
CHOLESTEROL: 229 mg/dL — AB (ref <200)
HDL: 56 mg/dL (ref >50)
LDL CHOLESTEROL (CALC): 155 mg/dL — AB
Non-HDL Cholesterol (Calc): 173 mg/dL (calc) — ABNORMAL HIGH (ref <130)
Total CHOL/HDL Ratio: 4.1 (calc) (ref <5.0)
Triglycerides: 80 mg/dL (ref <150)

## 2017-10-23 LAB — TSH: TSH: 3.78 m[IU]/L (ref 0.40–4.50)

## 2017-10-23 LAB — VITAMIN D 25 HYDROXY (VIT D DEFICIENCY, FRACTURES): Vit D, 25-Hydroxy: 30 ng/mL (ref 30–100)

## 2017-10-23 NOTE — Progress Notes (Signed)
Chief Complaint  Patient presents with  . Hypertension    nonfasting med check, labs already done.    Hypertension follow-up: Blood pressure at [facility] around Christmas was 102/74. It generally runs up to 120/74. Denies dizziness, headaches (just related to work/stress, occasional sinus), chest pain. Denies side effects of medications. Having some headaches every few days, between her eyes, and posterior neck.  Impaired fasting glucose-- A1c's have been normal, with elevated fasting sugars. She had labs prior to visit, see below. She isn't getting any exercise recently.  She just joined a gym.   Hyperlipidemia--She cut out alfredo sauce and pizza (very rare). Has red meat 3x/week. She doesn't eat eggs, tries to limit cheese, just on salads (and tacos, small amount). She has been working on her diet, has never been on cholesterol medications.  Labs done prior to visit, see below.  Anxiety follow-up: Moods are good, anxiety is controlled. She was disappointed when she didn't get a job she applied for, had a rough couple of weeks adjusting to that, but moods are now better.  She currently denies any depressive symptoms. Son is doing well, not causing her stress. She continues to have stress related to work schedule, working long hours. Denies any side effects to the medication.  Vitamin D deficiency:She has been taking 2000 IU OTC Vitamin D daily since the last prescription course.   GERD:  Last year she reported increase in frequency of reflux, was using zantac prn. She saw Dr. Penelope Coop and had EGD in August, showing gastritis. She has been on aciphex since then, still has some reflux symptoms with certain foods. She is due for f/u with him soon.  Had colonoscopy in 06/2015, and mammogram 04/2017.  She is still past due for GYN, had to cancel her appointment, and hasn't rescheduled yet. She saw dermatologist.  Immunization History  Administered Date(s) Administered  . Hepatitis A  08/16/2006  . Hepatitis A, Adult 04/12/2016  . Influenza,inj,Quad PF,6+ Mos 10/10/2012, 08/19/2014, 09/08/2015, 10/18/2016  . Tdap 04/12/2016  She [facility]  [facility], [facility] reviewed  Outpatient Encounter Medications as of 10/24/2017  Medication Sig Note  . amLODipine (NORVASC) 10 MG tablet Take 1 tablet (10 mg total) by mouth daily.   . cholecalciferol (VITAMIN D) 1000 units tablet Take 2,000 Units by mouth daily.   . citalopram (CELEXA) 40 MG tablet Take 0.5 tablets (20 mg total) by mouth daily.   Marland Kitchen losartan-hydrochlorothiazide (HYZAAR) 100-25 MG tablet Take 1 tablet by mouth daily.   . RABEprazole (ACIPHEX) 20 MG tablet Take 20 mg by mouth daily.   . [DISCONTINUED] amLODipine (NORVASC) 10 MG tablet TAKE 1 TABLET (10 MG TOTAL) BY MOUTH DAILY.   . [DISCONTINUED] citalopram (CELEXA) 40 MG tablet TAKE 1/2 TABLET BY MOUTH DAILY   . [DISCONTINUED] losartan-hydrochlorothiazide (HYZAAR) 100-25 MG tablet TAKE 1 TABLET BY MOUTH DAILY.   . [DISCONTINUED] ranitidine (ZANTAC) 150 MG capsule Take 150 mg by mouth 2 (two) times daily. 10/18/2016: Using prn (not BID), uses before spicy foods and prn   No facility-administered encounter medications on file as of 10/24/2017.     ROS:  Denies fever, chills, URI symptoms (had recent cold, improved).  No cough, shortness of breath, chest pain. Moods are good overall; +work stress. No dizziness, edema, bleeding, bruising, rash.   See HPI +9# weight gain in the last year  PHYSICAL EXAM:  BP 140/90   Pulse 88   Ht 5\' 7"  (1.702 m)  Wt 215 lb 3.2 oz (97.6 kg)   BMI 33.71 kg/m   Wt Readings from Last 3 Encounters:  10/24/17 215 lb 3.2 oz (97.6 kg)  10/18/16 196 lb 9.6 oz (89.2 kg)  04/24/16 195 lb 12.8 oz (88.8 kg)    Well developed, pleasant female, in good spirits HEENT: PERRL, EOMI, conjunctiva and sclera clear Neck: no lymphadenopathy, thyromegaly or carotid bruit Heart: regular rate and rhythm without murmur Lungs: clear  bilaterally Back: no CVA or spinal tenderness Abdomen: soft, nontender, no organomegaly or mass Extremities: no edema, 2+ pulses Skin: normal turgor, no rashes/lesions Neuro: alert and oriented, cranial nerves intact, normal gait, strength Psych: normal mood, affect, hygiene and grooming, normal eye contact and speech  Lab Results  Component Value Date   HGBA1C 5.6 10/22/2017     Chemistry      Component Value Date/Time   NA 140 10/22/2017 0823   K 4.1 10/22/2017 0823   CL 103 10/22/2017 0823   CO2 28 10/22/2017 0823   BUN 24 10/22/2017 0823   CREATININE 1.06 (H) 10/22/2017 0823      Component Value Date/Time   CALCIUM 9.9 10/22/2017 0823   ALKPHOS 79 04/11/2016 0732   AST 16 10/22/2017 0823   ALT 14 10/22/2017 0823   BILITOT 0.6 10/22/2017 0823     Fasting gluc 100 Vitamin D-OH 30  Lab Results  Component Value Date   WBC 3.2 (L) 10/22/2017   HGB 13.3 10/22/2017   HCT 38.4 10/22/2017   MCV 84.4 10/22/2017   PLT 170 10/22/2017   Lab Results  Component Value Date   TSH 3.78 10/22/2017   Lab Results  Component Value Date   CHOL 229 (H) 10/22/2017   HDL 56 10/22/2017   LDLCALC 124 (H) 10/17/2016   TRIG 80 10/22/2017   CHOLHDL 4.1 10/22/2017    ASSESSMENT/PLAN:  Essential (primary) hypertension - elevated here, okay per checks at pharmacy; daily exercise, low sodium diet, weight loss and continued monitoring; f/u 3 mos with list - Plan: amLODipine (NORVASC) 10 MG tablet, losartan-hydrochlorothiazide (HYZAAR) 100-25 MG tablet  Impaired fasting glucose - borderline, but not exercising and has gained weight. Counseled extensively  Vitamin D deficiency - continue daily supplement  Pure hypercholesterolemia - significant increase since last year. Discussed goals (LDL<130) and diet  Gastroesophageal reflux disease, esophagitis presence not specified - continue aciphex and f/u with GI. Weight loss, smallfrequent meals, proper diet briefly discussed  Essential  (primary) hypertension - Plan: amLODipine (NORVASC) 10 MG tablet, losartan-hydrochlorothiazide (HYZAAR) 100-25 MG tablet  Anxiety disorder - controlled - Plan: citalopram (CELEXA) 40 MG tablet  Obesity with body mass index greater than 30 - counseled re: diet, exercise, portion control   Your weight is up, your cholesterol is up, and your blood pressure was elevated today (but seems fine at the pharmacy). Please read info provided on low sodium diet.  Daily exercise, low sodium diet and weight loss should help blood pressure. If your cholesterol remains where it is now, you will need a cholesterol medication. It was better in the past, so you need to work on the diet. Limit portions, eat healthy foods--lots of fruits and vegetables, and a high fiber diet. I encourage you to follow a weight-watchers diet, tracking exercise and points, even if not formally rejoining/paying.   Return in 3 months, with list of blood pressures (and if using a home monitor, bring that with you also). Return fasting, as we will recheck your cholesterol.

## 2017-10-24 ENCOUNTER — Ambulatory Visit: Payer: 59 | Admitting: Family Medicine

## 2017-10-24 ENCOUNTER — Encounter: Payer: Self-pay | Admitting: Family Medicine

## 2017-10-24 VITALS — BP 136/86 | HR 88 | Ht 67.0 in | Wt 215.2 lb

## 2017-10-24 DIAGNOSIS — I10 Essential (primary) hypertension: Secondary | ICD-10-CM | POA: Diagnosis not present

## 2017-10-24 DIAGNOSIS — R7301 Impaired fasting glucose: Secondary | ICD-10-CM

## 2017-10-24 DIAGNOSIS — E559 Vitamin D deficiency, unspecified: Secondary | ICD-10-CM

## 2017-10-24 DIAGNOSIS — F419 Anxiety disorder, unspecified: Secondary | ICD-10-CM

## 2017-10-24 DIAGNOSIS — K219 Gastro-esophageal reflux disease without esophagitis: Secondary | ICD-10-CM

## 2017-10-24 DIAGNOSIS — E669 Obesity, unspecified: Secondary | ICD-10-CM

## 2017-10-24 DIAGNOSIS — E7800 Pure hypercholesterolemia, unspecified: Secondary | ICD-10-CM | POA: Diagnosis not present

## 2017-10-24 MED ORDER — LOSARTAN POTASSIUM-HCTZ 100-25 MG PO TABS: 1.0000 | ORAL_TABLET | Freq: Every day | ORAL | 2 refills | Status: DC

## 2017-10-24 MED ORDER — AMLODIPINE BESYLATE 10 MG PO TABS: 10.0000 mg | ORAL_TABLET | Freq: Every day | ORAL | 5 refills | Status: DC

## 2017-10-24 MED ORDER — CITALOPRAM HYDROBROMIDE 40 MG PO TABS: 20.0000 mg | ORAL_TABLET | Freq: Every day | ORAL | 5 refills | Status: DC

## 2017-10-24 NOTE — Patient Instructions (Addendum)
It is recommended that you get at least 30 minutes of aerobic exercise at least 5 days/week (for weight loss, you may need as much as 60-90 minutes). This can be any activity that gets your heart rate up. This can be divided in 10-15 minute intervals if needed, but try and build up your endurance at least once a week.  Weight bearing exercise is also recommended twice weekly.  I recommend getting the new shingles vaccine (Shingrix). You will need to check with your insurance to see if it is covered, and if covered by Medicare Part D, you need to get from the pharmacy rather than our office.  It is a series of 2 injections, spaced 2 months apart. Schedule a nurse visit if you prefer to get it here over the pharmacy.  Please call your GYN and schedule your visit, as you are past due.   Your weight is up, your cholesterol is up, and your blood pressure was elevated today (but seems fine at the pharmacy). Please read info provided on low sodium diet.  Daily exercise, low sodium diet and weight loss should help blood pressure. If your cholesterol remains where it is now, you will need a cholesterol medication. It was better in the past, so you need to work on the diet. Limit portions, eat healthy foods--lots of fruits and vegetables, and a high fiber diet. I encourage you to follow a weight-watchers diet, tracking exercise and points, even if not formally rejoining/paying.   Return in 3 months, with list of blood pressures (and if using a home monitor, bring that with you also). Return fasting, as we will recheck your cholesterol.     Fat and Cholesterol Restricted Diet Getting too much fat and cholesterol in your diet may cause health problems. Following this diet helps keep your fat and cholesterol at normal levels. This can keep you from getting sick. What types of fat should I choose?  Choose monosaturated and polyunsaturated fats. These are found in foods such as olive oil, canola oil,  flaxseeds, walnuts, almonds, and seeds.  Eat more omega-3 fats. Good choices include salmon, mackerel, sardines, tuna, flaxseed oil, and ground flaxseeds.  Limit saturated fats. These are in animal products such as meats, butter, and cream. They can also be in plant products such as palm oil, palm kernel oil, and coconut oil.  Avoid foods with partially hydrogenated oils in them. These contain trans fats. Examples of foods that have trans fats are stick margarine, some tub margarines, cookies, crackers, and other baked goods. What general guidelines do I need to follow?  Check food labels. Look for the words "trans fat" and "saturated fat."  When preparing a meal: ? Fill half of your plate with vegetables and green salads. ? Fill one fourth of your plate with whole grains. Look for the word "whole" as the first word in the ingredient list. ? Fill one fourth of your plate with lean protein foods.  Eat more foods that have fiber, like apples, carrots, beans, peas, and barley.  Eat more home-cooked foods. Eat less at restaurants and buffets.  Limit or avoid alcohol.  Limit foods high in starch and sugar.  Limit fried foods.  Cook foods without frying them. Baking, boiling, grilling, and broiling are all great options.  Lose weight if you are overweight. Losing even a small amount of weight can help your overall health. It can also help prevent diseases such as diabetes and heart disease. What foods can I eat?  Grains Whole grains, such as whole wheat or whole grain breads, crackers, cereals, and pasta. Unsweetened oatmeal, bulgur, barley, quinoa, or brown rice. Corn or whole wheat flour tortillas. Vegetables Fresh or frozen vegetables (raw, steamed, roasted, or grilled). Green salads. Fruits All fresh, canned (in natural juice), or frozen fruits. Meat and Other Protein Products Ground beef (85% or leaner), grass-fed beef, or beef trimmed of fat. Skinless chicken or [place]. Ground  chicken or [place]. Pork trimmed of fat. All fish and seafood. Eggs. Dried beans, peas, or lentils. Unsalted nuts or seeds. Unsalted canned or dry beans. Dairy Low-fat dairy products, such as skim or 1% milk, 2% or reduced-fat cheeses, low-fat ricotta or cottage cheese, or plain low-fat yogurt. Fats and Oils Tub margarines without trans fats. Light or reduced-fat mayonnaise and salad dressings. Avocado. Olive, canola, sesame, or safflower oils. Natural peanut or almond butter (choose ones without added sugar and oil). The items listed above may not be a complete list of recommended foods or beverages. Contact your dietitian for more options. What foods are not recommended? Grains White bread. White pasta. White rice. Cornbread. Bagels, pastries, and croissants. Crackers that contain trans fat. Vegetables White potatoes. Corn. Creamed or fried vegetables. Vegetables in a cheese sauce. Fruits Dried fruits. Canned fruit in light or heavy syrup. Fruit juice. Meat and Other Protein Products Fatty cuts of meat. Ribs, chicken wings, bacon, sausage, bologna, salami, chitterlings, fatback, hot dogs, bratwurst, and packaged luncheon meats. Liver and organ meats. Dairy Whole or 2% milk, cream, half-and-half, and cream cheese. Whole milk cheeses. Whole-fat or sweetened yogurt. Full-fat cheeses. Nondairy creamers and whipped toppings. Processed cheese, cheese spreads, or cheese curds. Sweets and Desserts Corn syrup, sugars, honey, and molasses. Candy. Jam and jelly. Syrup. Sweetened cereals. Cookies, pies, cakes, donuts, muffins, and ice cream. Fats and Oils Butter, stick margarine, lard, shortening, ghee, or bacon fat. Coconut, palm kernel, or palm oils. Beverages Alcohol. Sweetened drinks (such as sodas, lemonade, and fruit drinks or punches). The items listed above may not be a complete list of foods and beverages to avoid. Contact your dietitian for more information. This information is not intended to  replace advice given to you by your health care provider. Make sure you discuss any questions you have with your health care provider. Document Released: 04/02/2012 Document Revised: 06/08/2016 Document Reviewed: 01/01/2014 Elsevier Interactive Patient Education  2018 [employer].    Low-Sodium Eating Plan Sodium, which is an element that makes up salt, helps you maintain a healthy balance of fluids in your body. Too much sodium can increase your blood pressure and cause fluid and waste to be held in your body. Your health care provider or dietitian may recommend following this plan if you have high blood pressure (hypertension), kidney disease, liver disease, or heart failure. Eating less sodium can help lower your blood pressure, reduce swelling, and protect your heart, liver, and kidneys. What are tips for following this plan? General guidelines  Most people on this plan should limit their sodium intake to 1,500-2,000 mg (milligrams) of sodium each day. Reading food labels  The Nutrition Facts label lists the amount of sodium in one serving of the food. If you eat more than one serving, you must multiply the listed amount of sodium by the number of servings.  Choose foods with less than 140 mg of sodium per serving.  Avoid foods with 300 mg of sodium or more per serving. Shopping  Look for lower-sodium products, often labeled as "low-sodium" or "no  salt added."  Always check the sodium content even if foods are labeled as "unsalted" or "no salt added".  Buy fresh foods. ? Avoid canned foods and premade or frozen meals. ? Avoid canned, cured, or processed meats  Buy breads that have less than 80 mg of sodium per slice. Cooking  Eat more home-cooked food and less restaurant, buffet, and fast food.  Avoid adding salt when cooking. Use salt-free seasonings or herbs instead of table salt or sea salt. Check with your health care provider or pharmacist before using salt  substitutes.  Cook with plant-based oils, such as canola, sunflower, or olive oil. Meal planning  When eating at a restaurant, ask that your food be prepared with less salt or no salt, if possible.  Avoid foods that contain MSG (monosodium glutamate). MSG is sometimes added to [place] food, bouillon, and some canned foods. What foods are recommended? The items listed may not be a complete list. Talk with your dietitian about what dietary choices are best for you. Grains Low-sodium cereals, including oats, puffed wheat and rice, and shredded wheat. Low-sodium crackers. Unsalted rice. Unsalted pasta. Low-sodium bread. Whole-grain breads and whole-grain pasta. Vegetables Fresh or frozen vegetables. "No salt added" canned vegetables. "No salt added" tomato sauce and paste. Low-sodium or reduced-sodium tomato and vegetable juice. Fruits Fresh, frozen, or canned fruit. Fruit juice. Meats and other protein foods Fresh or frozen (no salt added) meat, poultry, seafood, and fish. Low-sodium canned tuna and salmon. Unsalted nuts. Dried peas, beans, and lentils without added salt. Unsalted canned beans. Eggs. Unsalted nut butters. Dairy Milk. Soy milk. Cheese that is naturally low in sodium, such as ricotta cheese, fresh mozzarella, or Swiss cheese Low-sodium or reduced-sodium cheese. Cream cheese. Yogurt. Fats and oils Unsalted butter. Unsalted margarine with no trans fat. Vegetable oils such as canola or olive oils. Seasonings and other foods Fresh and dried herbs and spices. Salt-free seasonings. Low-sodium mustard and ketchup. Sodium-free salad dressing. Sodium-free light mayonnaise. Fresh or refrigerated horseradish. Lemon juice. Vinegar. Homemade, reduced-sodium, or low-sodium soups. Unsalted popcorn and pretzels. Low-salt or salt-free chips. What foods are not recommended? The items listed may not be a complete list. Talk with your dietitian about what dietary choices are best for  you. Grains Instant hot cereals. Bread stuffing, pancake, and biscuit mixes. Croutons. Seasoned rice or pasta mixes. Noodle soup cups. Boxed or frozen macaroni and cheese. Regular salted crackers. Self-rising flour. Vegetables Sauerkraut, pickled vegetables, and relishes. Olives. [place] fries. Onion rings. Regular canned vegetables (not low-sodium or reduced-sodium). Regular canned tomato sauce and paste (not low-sodium or reduced-sodium). Regular tomato and vegetable juice (not low-sodium or reduced-sodium). Frozen vegetables in sauces. Meats and other protein foods Meat or fish that is salted, canned, smoked, spiced, or pickled. Bacon, ham, sausage, hotdogs, corned beef, chipped beef, packaged lunch meats, salt pork, jerky, pickled herring, anchovies, regular canned tuna, sardines, salted nuts. Dairy Processed cheese and cheese spreads. Cheese curds. Blue cheese. Feta cheese. String cheese. Regular cottage cheese. Buttermilk. Canned milk. Fats and oils Salted butter. Regular margarine. Ghee. Bacon fat. Seasonings and other foods Onion salt, garlic salt, seasoned salt, table salt, and sea salt. Canned and packaged gravies. Worcestershire sauce. Tartar sauce. Barbecue sauce. Teriyaki sauce. Soy sauce, including reduced-sodium. Steak sauce. Fish sauce. Oyster sauce. Cocktail sauce. Horseradish that you find on the shelf. Regular ketchup and mustard. Meat flavorings and tenderizers. Bouillon cubes. Hot sauce and Tabasco sauce. Premade or packaged marinades. Premade or packaged taco seasonings. Relishes. Regular salad dressings. Salsa.  Potato and tortilla chips. Corn chips and puffs. Salted popcorn and pretzels. Canned or dried soups. Pizza. Frozen entrees and pot pies. Summary  Eating less sodium can help lower your blood pressure, reduce swelling, and protect your heart, liver, and kidneys.  Most people on this plan should limit their sodium intake to 1,500-2,000 mg (milligrams) of sodium each  day.  Canned, boxed, and frozen foods are high in sodium. Restaurant foods, fast foods, and pizza are also very high in sodium. You also get sodium by adding salt to food.  Try to cook at home, eat more fresh fruits and vegetables, and eat less fast food, canned, processed, or prepared foods. This information is not intended to replace advice given to you by your health care provider. Make sure you discuss any questions you have with your health care provider. Document Released: 03/24/2002 Document Revised: 09/25/2016 Document Reviewed: 09/25/2016 Elsevier Interactive Patient Education  [employer].

## 2018-01-24 ENCOUNTER — Other Ambulatory Visit: Payer: Self-pay | Admitting: Family Medicine

## 2018-01-24 DIAGNOSIS — I10 Essential (primary) hypertension: Secondary | ICD-10-CM

## 2018-01-29 NOTE — Progress Notes (Signed)
Chief Complaint  Patient presents with  . Hypertension    3 mo fasting follow up-brought in bp machine to verify.    Patient presents for 3 month follow-up, as her weight, blood pressure and cholesterol were up on last check.  BP's had been okay at pharmacy, but elevated here.  Meds were not changed--advised to exercise, lose weight, low sodium diet, monitor regularly and bring list to today's visit. She reports blood pressure running (on her new monitor): 137/86, 137/81, 135/84, 142/82, 139/81, 137/80, 136/79, 140/90, 134/86, 131/92, 131/76, 129/83 read from her machine over the last 2.5 months. 119/70 over the weekend at the pharmacy (different monitor, on weekend, when has no work stress).   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  "I got major chewed out twice" on the phone this week so far.  Trying to find a new position. Didn't get a position she applied for last year.  Feels like she is being strung along. Husband's schedule changed, needs to be at work at [employer]. Husband wanted her to tell me she isn't sleeping well.  He goes to bed much earlier, and wakes her up when he wakes to go to work.  She reports compliance with her medications (amlodipine and losartan-HCT), denies side effects. Denies headaches (occasional sinus, stress/tension), chest pain, palpitations, shortness of breath. She had just joined gym prior to last visit. She hasn't been able to go.  Her mother-in-law has moved in with her, and they are doing some home renovations. She expects her to be home at 6:30 and calls if she is late (husband is home, but sleeping). She has lost 15#--she has been working hard renovating her house, and cut back portions in her diet, cut back on hamburger, more chicken.  Hyperlipidemia--Lipids at  last visit were elevated.  Her diet then consisted of: red meat 3x/week (she now only eats it 1x/week, tacos). She doesn't eat eggs, tries to limit cheese, just on salads (and tacos, small amount).  She cut out alfredo sauce and pizza (very rare). She is now eating more fruits as snacks (apples, grapes). She has never taken lipid-lower medications. Lab Results  Component Value Date   CHOL 229 (H) 10/22/2017   HDL 56 10/22/2017   LDLCALC 155 (H) 10/22/2017   TRIG 80 10/22/2017   CHOLHDL 4.1 10/22/2017   PMH, PSH, SH reviewed, updated  Outpatient Encounter Medications as of 01/30/2018  Medication Sig  . amLODipine (NORVASC) 10 MG tablet Take 1 tablet (10 mg total) by mouth daily.  . cholecalciferol (VITAMIN D) 1000 units tablet Take 2,000 Units by mouth daily.  . citalopram (CELEXA) 40 MG tablet Take 0.5 tablets (20 mg total) by mouth daily.  Marland Kitchen losartan-hydrochlorothiazide (HYZAAR) 100-25 MG tablet TAKE 1 TABLET BY MOUTH EVERY DAY  . RABEprazole (ACIPHEX) 20 MG tablet Take 20 mg by mouth daily.   No facility-administered encounter medications on file as of 01/30/2018.    No Known Allergies  ROS: no fever, chills, URI symptoms, headaches, dizziness, chest pain, GI or GU complaints, edema, bleeding, bruising, rash. +stress, interrupted sleep due to her husband's routine. +Intentional weight loss  PHYSICAL EXAM:  BP (!) 150/94   Pulse 72   Ht 5\' 6"  (1.676 m)   Wt 200 lb 9.6 oz (91 kg)  BMI 32.38 kg/m   142/86 on repeat by MD, R arm 135/71 with pt's machine on Left 138/86 on Left arm by MD  Wt Readings from Last 3 Encounters:  01/30/18 200 lb 9.6 oz (91 kg)  10/24/17 215 lb 3.2 oz (97.6 kg)  10/18/16 196 lb 9.6 oz (89.2 kg)   Well developed, pleasant female, in no distress HEENT: PERRL, EOMI, conjunctiva and sclera clear Neck: no lymphadenopathy, thyromegaly or carotid bruit Heart: regular rate and rhythm without murmur Lungs: clear bilaterally Back: no CVA or spinal  tenderness Abdomen: soft, nontender, no organomegaly or mass Extremities: no edema, 2+ pulses Skin: normal turgor, no rashes/lesions Neuro: alert and oriented, cranial nerves intact, normal gait, strength Psych: normal mood, thought easily agitated, complaining a lot today. Full range of affect, normal hygiene and grooming, normal eye contact and speech  ASSESSMENT/PLAN:  Essential (primary) hypertension - borderline control, improved; much lower on weekends (at pharmacy). Counseled re: work stress. Cont current meds, start exercise, cont wt loss  Pure hypercholesterolemia - Plan: Lipid panel  Impaired fasting glucose - cont lowfat diet, weight loss. Encouraged regular exercise - Plan: Glucose, random  Obesity (BMI 30.0-34.9) - congratulated on efforts so far, more to go. Counseled re: diet, exercise, goals.  Anxiety disorder, unspecified type - significant work Publishing copy, regular exercise. To consider increasing citalopram. Cont to look for another job/position   Glu, lipids today  Consider increasing citalopram (never took 40mg , changed when 20 not available), might be helpful. Strongly encouraged counseling. Discussed techniques she can try during the day to help decrease stress (and therefore BP). Monitor is reading high (diastolic), slightly high systolic. Much lower at pharmacy--? Related to different cuff vs being relaxed on weekends. Rec she use her home monitor on weekends to see if lower also. Encouraged her to use BP log given, rather than relying on the memory in her machine, and instructed on which notes/comments to keep. Will not change meds for now, but reassess in another 3 mos. Bring log to f/u.   Counseled re: sleep Recommended sound machine or ear plugs  40-45 min visit today, more than 1/2 spent counseling (re stress, HTN, exercise, weight, diet, insomnia, though mostly anxiety/stress)

## 2018-01-30 ENCOUNTER — Ambulatory Visit: Payer: 59 | Admitting: Family Medicine

## 2018-01-30 ENCOUNTER — Encounter: Payer: Self-pay | Admitting: Family Medicine

## 2018-01-30 VITALS — BP 138/86 | HR 72 | Ht 66.0 in | Wt 200.6 lb

## 2018-01-30 DIAGNOSIS — I10 Essential (primary) hypertension: Secondary | ICD-10-CM

## 2018-01-30 DIAGNOSIS — E669 Obesity, unspecified: Secondary | ICD-10-CM | POA: Diagnosis not present

## 2018-01-30 DIAGNOSIS — F419 Anxiety disorder, unspecified: Secondary | ICD-10-CM

## 2018-01-30 DIAGNOSIS — E7800 Pure hypercholesterolemia, unspecified: Secondary | ICD-10-CM

## 2018-01-30 DIAGNOSIS — R7301 Impaired fasting glucose: Secondary | ICD-10-CM | POA: Diagnosis not present

## 2018-01-30 LAB — LIPID PANEL
CHOL/HDL RATIO: 3.8 ratio (ref 0.0–4.4)
Cholesterol, Total: 219 mg/dL — ABNORMAL HIGH (ref 100–199)
HDL: 58 mg/dL (ref >39)
LDL Calculated: 146 mg/dL — ABNORMAL HIGH (ref 0–99)
Triglycerides: 77 mg/dL (ref 0–149)
VLDL Cholesterol Cal: 15 mg/dL (ref 5–40)

## 2018-01-30 LAB — GLUCOSE, RANDOM: Glucose: 101 mg/dL — ABNORMAL HIGH (ref 65–99)

## 2018-01-30 NOTE — Patient Instructions (Addendum)
Try a sound machine or ear plugs to have fewer sleep disruptions related to your husband's work schedule.  I encourage you to contact EAP (employee assistance program) if you have one through your job (and if not, contact a counselor through your insurance) for counseling to help with stress related to work/home.  Continue low sodium diet and monitoring your blood pressure using the sheet provided.  You need to get the 150 minutes/week of aerobic exercise as we discussed (whether it is at the gym or not). Goal BP is <130/80.  Congrats on the weight loss so far--continue to eat more fruit, vegetables, cut back on carbs, smaller portions. Keep up the good work--more to go.  Regular exercise will help.    Insomnia Insomnia is a sleep disorder that makes it difficult to fall asleep or to stay asleep. Insomnia can cause tiredness (fatigue), low energy, difficulty concentrating, mood swings, and poor performance at work or school. There are three different ways to classify insomnia:  Difficulty falling asleep.  Difficulty staying asleep.  Waking up too early in the morning.  Any type of insomnia can be long-term (chronic) or short-term (acute). Both are common. Short-term insomnia usually lasts for three months or less. Chronic insomnia occurs at least three times a week for longer than three months. What are the causes? Insomnia may be caused by another condition, situation, or substance, such as:  Anxiety.  Certain medicines.  Gastroesophageal reflux disease (GERD) or other gastrointestinal conditions.  Asthma or other breathing conditions.  Restless legs syndrome, sleep apnea, or other sleep disorders.  Chronic pain.  Menopause. This may include hot flashes.  Stroke.  Abuse of alcohol, tobacco, or illegal drugs.  Depression.  Caffeine.  Neurological disorders, such as Alzheimer disease.  An overactive thyroid (hyperthyroidism).  The cause of insomnia may not be  known. What increases the risk? Risk factors for insomnia include:  Gender. Women are more commonly affected than men.  Age. Insomnia is more common as you get older.  Stress. This may involve your professional or personal life.  Income. Insomnia is more common in people with lower income.  Lack of exercise.  Irregular work schedule or night shifts.  Traveling between different time zones.  What are the signs or symptoms? If you have insomnia, trouble falling asleep or trouble staying asleep is the main symptom. This may lead to other symptoms, such as:  Feeling fatigued.  Feeling nervous about going to sleep.  Not feeling rested in the morning.  Having trouble concentrating.  Feeling irritable, anxious, or depressed.  How is this treated? Treatment for insomnia depends on the cause. If your insomnia is caused by an underlying condition, treatment will focus on addressing the condition. Treatment may also include:  Medicines to help you sleep.  Counseling or therapy.  Lifestyle adjustments.  Follow these instructions at home:  Take medicines only as directed by your health care provider.  Keep regular sleeping and waking hours. Avoid naps.  Keep a sleep diary to help you and your health care provider figure out what could be causing your insomnia. Include: ? When you sleep. ? When you wake up during the night. ? How well you sleep. ? How rested you feel the next day. ? Any side effects of medicines you are taking. ? What you eat and drink.  Make your bedroom a comfortable place where it is easy to fall asleep: ? Put up shades or special blackout curtains to block light from outside. ?  Use a white noise machine to block noise. ? Keep the temperature cool.  Exercise regularly as directed by your health care provider. Avoid exercising right before bedtime.  Use relaxation techniques to manage stress. Ask your health care provider to suggest some techniques  that may work well for you. These may include: ? Breathing exercises. ? Routines to release muscle tension. ? Visualizing peaceful scenes.  Cut back on alcohol, caffeinated beverages, and cigarettes, especially close to bedtime. These can disrupt your sleep.  Do not overeat or eat spicy foods right before bedtime. This can lead to digestive discomfort that can make it hard for you to sleep.  Limit screen use before bedtime. This includes: ? Watching TV. ? Using your smartphone, tablet, and computer.  Stick to a routine. This can help you fall asleep faster. Try to do a quiet activity, brush your teeth, and go to bed at the same time each night.  Get out of bed if you are still awake after 15 minutes of trying to sleep. Keep the lights down, but try reading or doing a quiet activity. When you feel sleepy, go back to bed.  Make sure that you drive carefully. Avoid driving if you feel very sleepy.  Keep all follow-up appointments as directed by your health care provider. This is important. Contact a health care provider if:  You are tired throughout the day or have trouble in your daily routine due to sleepiness.  You continue to have sleep problems or your sleep problems get worse. Get help right away if:  You have serious thoughts about hurting yourself or someone else. This information is not intended to replace advice given to you by your health care provider. Make sure you discuss any questions you have with your health care provider. Document Released: 09/29/2000 Document Revised: 03/03/2016 Document Reviewed: 07/03/2014 Elsevier Interactive Patient Education  [employer].

## 2018-02-23 ENCOUNTER — Other Ambulatory Visit: Payer: Self-pay | Admitting: Family Medicine

## 2018-02-23 DIAGNOSIS — I10 Essential (primary) hypertension: Secondary | ICD-10-CM

## 2018-03-04 ENCOUNTER — Encounter: Payer: Self-pay | Admitting: Podiatry

## 2018-03-04 ENCOUNTER — Ambulatory Visit (INDEPENDENT_AMBULATORY_CARE_PROVIDER_SITE_OTHER): Payer: 59

## 2018-03-04 ENCOUNTER — Ambulatory Visit: Payer: 59 | Admitting: Podiatry

## 2018-03-04 DIAGNOSIS — M722 Plantar fascial fibromatosis: Secondary | ICD-10-CM

## 2018-03-04 MED ORDER — MELOXICAM 15 MG PO TABS: 15.0000 mg | ORAL_TABLET | Freq: Every day | ORAL | 0 refills | Status: DC

## 2018-03-04 MED ORDER — TRIAMCINOLONE ACETONIDE 10 MG/ML IJ SUSP
10.0000 mg | Freq: Once | INTRAMUSCULAR | Status: AC
  Administered 2018-03-04: 10 mg

## 2018-03-04 NOTE — Patient Instructions (Signed)

## 2018-03-04 NOTE — Progress Notes (Signed)
Subjective:    Patient ID: Carol Thomas, female    DOB: 13-May-1956, 62 y.o.   MRN: [id number]  HPI 62 year old female presents the office today for concerns of pain in the bottom of her right heel which is been ongoing for about 2 weeks.  She denies any recent injury or trauma she states that hurts in the bottom of her heel.  She states that when it started she is getting some pain to the inside aspect of her ankle after her heel started to hurt but that has resolved.  She states her daughter also has plantar fasciitis.  She denies any recent injury or trauma denies any numbness or tingling in the pain does not wake her up at night.  She has no other concerns.   Review of Systems  All other systems reviewed and are negative.  Past Medical History:  Diagnosis Date  . Adenomatous colon polyp 11/2008   Dr. Penelope Coop at [place]  . Anxiety   . Cancer ([place]) 1998   Melanoma-Left leg  . Hypertension   . Osteopenia 10/2012   T score of -2.2 FRAX 8%/1.1%  . Vitamin D deficiency 10/2012   vitamin D level 10    Past Surgical History:  Procedure Laterality Date  . ABDOMINAL HYSTERECTOMY  1997   [facility]  . APPENDECTOMY    . ESOPHAGOGASTRODUODENOSCOPY  05/24/2017   Erythema in the middle third of the esphagus and in lower third.-biopised erythematous mucosa in the antrum, normal examined duodenum  . Melanoma excised    . TUBAL LIGATION       Current Outpatient Medications:  .  amLODipine (NORVASC) 10 MG tablet, Take 1 tablet (10 mg total) by mouth daily., Disp: 30 tablet, Rfl: 5 .  cholecalciferol (VITAMIN D) 1000 units tablet, Take 2,000 Units by mouth daily., Disp: , Rfl:  .  citalopram (CELEXA) 40 MG tablet, Take 0.5 tablets (20 mg total) by mouth daily., Disp: 90 tablet, Rfl: 5 .  losartan-hydrochlorothiazide (HYZAAR) 100-25 MG tablet, TAKE 1 TABLET BY MOUTH EVERY DAY, Disp: 30 tablet, Rfl: 1 .  meloxicam (MOBIC) 15 MG tablet, Take 1 tablet (15 mg total) by mouth daily., Disp: 30 tablet, Rfl:  0 .  RABEprazole (ACIPHEX) 20 MG tablet, Take 20 mg by mouth daily., Disp: , Rfl:   No Known Allergies  Social History   Socioeconomic History  . Marital status: Married    Spouse name: Not on file  . Number of children: 2  . Years of education: Not on file  . Highest education level: Not on file  Occupational History  . Occupation: Probation officer)  Social Needs  . Financial resource strain: Not on file  . Food insecurity:    Worry: Not on file    Inability: Not on file  . Transportation needs:    Medical: Not on file    Non-medical: Not on file  Tobacco Use  . Smoking status: Never Smoker  . Smokeless tobacco: Never Used  Substance and Sexual Activity  . Alcohol use: No  . Drug use: No  . Sexual activity: Never    Birth control/protection: Surgical  Lifestyle  . Physical activity:    Days per week: Not on file    Minutes per session: Not on file  . Stress: Not on file  Relationships  . Social connections:    Talks on phone: Not on file    Gets together: Not on file    Attends religious service: Not  on file    Active member of club or organization: Not on file    Attends meetings of clubs or organizations: Not on file    Relationship status: Not on file  . Intimate partner violence:    Fear of current or ex partner: Not on file    Emotionally abused: Not on file    Physically abused: Not on file    Forced sexual activity: Not on file  Other Topics Concern  . Not on file  Social History Narrative   Married.  Lives with husband, 1 dogs, 2 cats. Watches her active grandchildren, and does Arts development officer rep, stressful job.   Mother-in-law moved in with them 2019.        Objective:   Physical Exam General: AAO x3, NAD  Dermatological: Skin is warm, dry and supple bilateral. Nails x 10 are well manicured; remaining integument appears unremarkable at this time. There are no open sores, no preulcerative lesions, no rash or  signs of infection present.  Vascular: Dorsalis Pedis artery and Posterior Tibial artery pedal pulses are 2/4 bilateral with immedate capillary fill time. Pedal hair growth present. No varicosities and no lower extremity edema present bilateral. There is no pain with calf compression, swelling, warmth, erythema.   Neruologic: Grossly intact via light touch bilateral. Vibratory intact via tuning fork bilateral. Protective threshold with Semmes Wienstein monofilament intact to all pedal sites bilateral.  Negative Tinel sign.  Musculoskeletal: Tenderness to palpation along the plantar medial tubercle of the calcaneus at the insertion of plantar fascia on the right foot. There is no pain along the course of the plantar fascia within the arch of the foot. Plantar fascia appears to be intact. There is no pain with lateral compression of the calcaneus or pain with vibratory sensation. There is no pain along the course or insertion of the achilles tendon. No other areas of tenderness to bilateral lower extremities.  There is no pain on the posterior tibial or flexor tendons but subjectively she was getting tenderness to this area previously but that has resolved.  Muscular strength 5/5 in all groups tested bilateral.  Gait: Unassisted, Nonantalgic.     Assessment & Plan:  62 year old female right heel pain, plantar fasciitis -Treatment options discussed including all alternatives, risks, and complications -Etiology of symptoms were discussed -X-rays were obtained and reviewed with the patient.  No definitive evidence of acute fracture or stress fracture identified today. -Steroid injection performed.  See procedure note below. -Plantar fascial brace dispensed -Prescribed mobic. Discussed side effects of the medication and directed to stop if any are to occur and call the office.  -Stretching, icing daily. -Discussed shoe modifications and orthotics -Follow-up in 3 weeks or sooner if needed.  Trula Slade DPM

## 2018-03-05 DIAGNOSIS — M722 Plantar fascial fibromatosis: Secondary | ICD-10-CM | POA: Insufficient documentation

## 2018-03-25 ENCOUNTER — Ambulatory Visit: Payer: 59 | Admitting: Podiatry

## 2018-04-09 ENCOUNTER — Telehealth: Payer: Self-pay | Admitting: Family Medicine

## 2018-04-09 DIAGNOSIS — R55 Syncope and collapse: Secondary | ICD-10-CM | POA: Diagnosis not present

## 2018-04-09 DIAGNOSIS — R9431 Abnormal electrocardiogram [ECG] [EKG]: Secondary | ICD-10-CM | POA: Diagnosis not present

## 2018-04-09 DIAGNOSIS — R42 Dizziness and giddiness: Secondary | ICD-10-CM | POA: Diagnosis not present

## 2018-04-09 DIAGNOSIS — T753XXA Motion sickness, initial encounter: Secondary | ICD-10-CM | POA: Diagnosis not present

## 2018-04-09 DIAGNOSIS — R110 Nausea: Secondary | ICD-10-CM | POA: Diagnosis not present

## 2018-04-09 DIAGNOSIS — I4581 Long QT syndrome: Secondary | ICD-10-CM | POA: Diagnosis not present

## 2018-04-09 DIAGNOSIS — R202 Paresthesia of skin: Secondary | ICD-10-CM | POA: Diagnosis not present

## 2018-04-09 NOTE — Telephone Encounter (Signed)
My chart message came over from [facility] wanting mom's med list.  My chart would  Not respond. I called Margreta Journey, she said they were in [place] and paramedics were there now as mom had passed out. I explained if she would get me the fax number for the hospital I would fax over.  Documents are readied awaiting fax number.

## 2018-04-09 NOTE — Telephone Encounter (Signed)
Noted--thanks for ensuring that the needed info gets sent to the hospital

## 2018-04-11 NOTE — Telephone Encounter (Signed)
Meds, ov notes, labs sent over to hospital

## 2018-04-29 ENCOUNTER — Other Ambulatory Visit: Payer: Self-pay | Admitting: Family Medicine

## 2018-04-29 DIAGNOSIS — I10 Essential (primary) hypertension: Secondary | ICD-10-CM

## 2018-05-05 NOTE — Progress Notes (Signed)
Chief Complaint  Patient presents with  . Hypertension    med check, nonfasting.     Patient presents for 3 month follow-up on hypertension.   Since her last visit she went to [place], [place] with her daughter and grandchildren. After a ride at [employer] on 6/25 (high intensity roller coaster) her hands felt swollen, tingling "like Mickey hands", along with significant nausea. Legs felt weak, BP was low at the park (90 systolic), seemed a little disoriented, so she went to the ER.  She had normal CT head without contrast, labs okay (glu 106, plt 133, Na 135, WBC 3.8). Treated with zofran and meclizine in the hospital, given rx's for both, but didn't fill either med, as she had no persistent nausea.  She states "they couldn't find anything wrong", told to avoid high intensity roller coasters. She had no further problems when at [facility].  Hypertension: At her last visit we noted that her monitor was reading high (diastolic), slightly high systolic. Much lower at pharmacy--? Related to different cuff vs being relaxed on weekends. We recommended that she use her home monitor on weekends to see if it was lower also. Encouraged her to use BP log given, rather than relying on the memory in her machine, and instructed on which notes/comments to keep.  She lost the paper, didn't keep a log. BP over the weekend 122/78 with her monitor.   Monday night she took her husband to the hospital, BP on Tuesday at pharmacy 122/70. Has not checked it weekdays when working.  Meds were not changed at her April visit--she continues on amlodipine 10mg  and losartan HCT 100-25. She denies side effects. Denies headaches (occasional sinus, stress/tension), chest pain, palpitations, shortness of breath.  She continues to try and follow low sodium diet; not currently getting regular exercise.  +stress--work, mother-in-law living with her and in hospital frequently, also at hospital with husband (he is okay).  At her last  visit we also discussed significant work stress.  She was looking for a new job.  We had discussed that increasing citalopram (never took 40mg , changed when 20 mg tablet was not available), might be helpful (didn't do this). Strongly encouraged counseling. "I don't have time for all that".  She has interviewed somewhere, waiting to hear back. Feels like she is dealing with work stress better, changes are being made frequently (hours changed), but tries not to stress about it "it is what it is".    PMH, PSH, SH reviewed, updated  Outpatient Encounter Medications as of 05/06/2018  Medication Sig  . amLODipine (NORVASC) 10 MG tablet Take 1 tablet (10 mg total) by mouth daily.  . cholecalciferol (VITAMIN D) 1000 units tablet Take 2,000 Units by mouth daily.  . citalopram (CELEXA) 40 MG tablet Take 0.5 tablets (20 mg total) by mouth daily.  Marland Kitchen losartan-hydrochlorothiazide (HYZAAR) 100-25 MG tablet Take 1 tablet by mouth daily.  . RABEprazole (ACIPHEX) 20 MG tablet Take 20 mg by mouth daily.  . [DISCONTINUED] amLODipine (NORVASC) 10 MG tablet Take 1 tablet (10 mg total) by mouth daily.  . [DISCONTINUED] losartan-hydrochlorothiazide (HYZAAR) 100-25 MG tablet TAKE 1 TABLET BY MOUTH EVERY DAY  . [DISCONTINUED] meloxicam (MOBIC) 15 MG tablet Take 1 tablet (15 mg total) by mouth daily. (Patient not taking: Reported on 05/06/2018)   No facility-administered encounter medications on file as of 05/06/2018.    No Known Allergies  ROS: no headaches, further dizziness or weakness, no chest pain, shortness of breath, edema, numbness, tingling. No  fever, chills, URI symptoms. No bleeding, bruising, rash or other complaints. See HPI   PHYSICAL EXAM:  BP 120/80 (BP Location: Right Arm, Cuff Size: Normal)   Pulse 80   Ht 5\' 6"  (1.676 m)   Wt 197 lb (89.4 kg)   BMI 31.80 kg/m   (blood pressure higher initially (150/90), better on recheck by nurse.)  Wt Readings from Last 3 Encounters:  05/06/18 197 lb (89.4  kg)  01/30/18 200 lb 9.6 oz (91 kg)  10/24/17 215 lb 3.2 oz (97.6 kg)   Well developed, pleasant female, in no distress HEENT: PERRL, EOMI, conjunctiva and sclera clear Neck: no lymphadenopathy, thyromegaly or carotid bruit Heart: regular rate and rhythm without murmur Lungs: clear bilaterally Back: no CVA or spinal tenderness Abdomen: soft, nontender, no organomegaly or mass Extremities: no edema, 2+ pulses Skin: normal turgor, no rashes/lesions Neuro: alert and oriented, cranial nerves intact, normal gait, strength Psych: normal mood, affect, hygiene and grooming, normal eye contact and speech   ASSESSMENT/PLAN:  Essential (primary) hypertension - normal on last few checks at home, and on repeat today, somewhat excitable and BP went up again; Cont current meds, home monitoring - Plan: amLODipine (NORVASC) 10 MG tablet, losartan-hydrochlorothiazide (HYZAAR) 100-25 MG tablet  Anxiety disorder, unspecified type - coping better with work stress, has some home stress (MIL) also. Encouraged daily exercise, consider counseling. Cont 20mg  citalopram  Need for shingles vaccine - risks/side effects reviewed.  Will give 2nd vaccine at her 3 mo f/u visit, rather than [place] in 2 mos (hard to get off work) - Plan: Varicella-zoster vaccine IM (Shingrix)  Recent ER visit, vertigo/equilibrium problem with nausea after a ride, nl eval/labs, and symptoms didn't recur.  Records brought in by patient were reviewed in detail.  Shingrix recommended-- She wants it today, regardless if insurance covers, willing to pay.  F/u in 3 months--can give 2nd Shingrix at that visit.  Please work on trying to get 30 minutes of exercise daily--to help with blood pressure and stress reduction.    Please schedule your yearly mammogram, and your well-woman exam.  Please continue to work on stress reduction; consider counseling to assist with this. Check your blood pressure more frequently, keep list. If it is high,  do some relaxation techniques and recheck. Record both values. Send [place] a copy of your BP log if it is consistently running over 343 systolic, otherwise just bring the list to your next visit.

## 2018-05-06 ENCOUNTER — Encounter: Payer: Self-pay | Admitting: Family Medicine

## 2018-05-06 ENCOUNTER — Ambulatory Visit: Payer: 59 | Admitting: Family Medicine

## 2018-05-06 VITALS — BP 120/80 | HR 80 | Ht 66.0 in | Wt 197.0 lb

## 2018-05-06 DIAGNOSIS — F419 Anxiety disorder, unspecified: Secondary | ICD-10-CM | POA: Diagnosis not present

## 2018-05-06 DIAGNOSIS — Z23 Encounter for immunization: Secondary | ICD-10-CM | POA: Diagnosis not present

## 2018-05-06 DIAGNOSIS — I10 Essential (primary) hypertension: Secondary | ICD-10-CM | POA: Diagnosis not present

## 2018-05-06 MED ORDER — LOSARTAN POTASSIUM-HCTZ 100-25 MG PO TABS: 1.0000 | ORAL_TABLET | Freq: Every day | ORAL | 5 refills | Status: DC

## 2018-05-06 MED ORDER — AMLODIPINE BESYLATE 10 MG PO TABS: 10.0000 mg | ORAL_TABLET | Freq: Every day | ORAL | 5 refills | Status: DC

## 2018-05-06 NOTE — Patient Instructions (Addendum)
Please work on trying to get 30 minutes of exercise daily--to help with blood pressure and stress reduction.    Please schedule your yearly mammogram, and your well-woman exam.   Please continue to work on stress reduction; consider counseling to assist with this. Check your blood pressure more frequently, keep list. If it is high, do some relaxation techniques and recheck. Record both values. Send [place] a copy of your BP log if it is consistently running over 102 systolic, otherwise just bring the list to your next visit.

## 2018-05-07 ENCOUNTER — Encounter: Payer: Self-pay | Admitting: Family Medicine

## 2018-07-17 DIAGNOSIS — D485 Neoplasm of uncertain behavior of skin: Secondary | ICD-10-CM | POA: Diagnosis not present

## 2018-07-17 DIAGNOSIS — D225 Melanocytic nevi of trunk: Secondary | ICD-10-CM | POA: Diagnosis not present

## 2018-07-17 DIAGNOSIS — L814 Other melanin hyperpigmentation: Secondary | ICD-10-CM | POA: Diagnosis not present

## 2018-07-17 DIAGNOSIS — L821 Other seborrheic keratosis: Secondary | ICD-10-CM | POA: Diagnosis not present

## 2018-07-17 DIAGNOSIS — D229 Melanocytic nevi, unspecified: Secondary | ICD-10-CM | POA: Diagnosis not present

## 2018-07-30 DIAGNOSIS — D485 Neoplasm of uncertain behavior of skin: Secondary | ICD-10-CM | POA: Diagnosis not present

## 2018-07-31 DIAGNOSIS — L989 Disorder of the skin and subcutaneous tissue, unspecified: Secondary | ICD-10-CM | POA: Diagnosis not present

## 2018-08-01 ENCOUNTER — Telehealth: Payer: Self-pay | Admitting: Family Medicine

## 2018-08-01 DIAGNOSIS — E7800 Pure hypercholesterolemia, unspecified: Secondary | ICD-10-CM

## 2018-08-01 DIAGNOSIS — R7301 Impaired fasting glucose: Secondary | ICD-10-CM

## 2018-08-01 DIAGNOSIS — I10 Essential (primary) hypertension: Secondary | ICD-10-CM

## 2018-08-01 NOTE — Telephone Encounter (Signed)
Pt called and states that she is coming in for a medcheck on October the 24th and she is wanting to know if she needs to fasting labs if so she would like to come in prior to her appt, she can be reached at [phone]

## 2018-08-01 NOTE — Telephone Encounter (Signed)
Pt is coming in Tuesday for labs

## 2018-08-01 NOTE — Telephone Encounter (Signed)
Future orders entered.  Ok to schedule lab visit.

## 2018-08-06 ENCOUNTER — Other Ambulatory Visit: Payer: 59

## 2018-08-06 DIAGNOSIS — I10 Essential (primary) hypertension: Secondary | ICD-10-CM

## 2018-08-06 DIAGNOSIS — R7301 Impaired fasting glucose: Secondary | ICD-10-CM

## 2018-08-06 DIAGNOSIS — E7800 Pure hypercholesterolemia, unspecified: Secondary | ICD-10-CM

## 2018-08-06 LAB — COMPREHENSIVE METABOLIC PANEL
A/G RATIO: 1.9 (ref 1.2–2.2)
ALBUMIN: 4.3 g/dL (ref 3.6–4.8)
ALT: 17 IU/L (ref 0–32)
AST: 17 IU/L (ref 0–40)
Alkaline Phosphatase: 88 IU/L (ref 39–117)
BILIRUBIN TOTAL: 0.6 mg/dL (ref 0.0–1.2)
BUN / CREAT RATIO: 12 (ref 12–28)
BUN: 14 mg/dL (ref 8–27)
CHLORIDE: 98 mmol/L (ref 96–106)
CO2: 24 mmol/L (ref 20–29)
Calcium: 10.1 mg/dL (ref 8.7–10.3)
Creatinine, Ser: 1.13 mg/dL — ABNORMAL HIGH (ref 0.57–1.00)
GFR, EST AFRICAN AMERICAN: 61 mL/min/{1.73_m2} (ref >59)
GFR, EST NON AFRICAN AMERICAN: 53 mL/min/{1.73_m2} — AB (ref >59)
GLUCOSE: 102 mg/dL — AB (ref 65–99)
Globulin, Total: 2.3 g/dL (ref 1.5–4.5)
Potassium: 4.9 mmol/L (ref 3.5–5.2)
Sodium: 136 mmol/L (ref 134–144)
TOTAL PROTEIN: 6.6 g/dL (ref 6.0–8.5)

## 2018-08-06 LAB — LIPID PANEL
CHOL/HDL RATIO: 3.8 ratio (ref 0.0–4.4)
Cholesterol, Total: 214 mg/dL — ABNORMAL HIGH (ref 100–199)
HDL: 57 mg/dL (ref >39)
LDL Calculated: 140 mg/dL — ABNORMAL HIGH (ref 0–99)
Triglycerides: 86 mg/dL (ref 0–149)
VLDL CHOLESTEROL CAL: 17 mg/dL (ref 5–40)

## 2018-08-06 LAB — HEMOGLOBIN A1C
Est. average glucose Bld gHb Est-mCnc: 111 mg/dL
Hgb A1c MFr Bld: 5.5 % (ref 4.8–5.6)

## 2018-08-06 NOTE — Progress Notes (Signed)
Chief Complaint  Patient presents with  . Hypertension    follow up.    Patient presents for 2nd shingrix vaccine, and for f/u on hypertension. She continues on amlodipine 37m and losartan-HCT 100-25.  BP's have been running 126-143/77-88. Once was 159/87 (when stressed). Averaging around 135/low 80's.  Denies headaches, dizziness, chest pain, palpitations, shortness of breath, edema.  She continues to have work and home stressors.  Overall feels like she is doing well on citalopram 252m Her daughter suggested she take a higher dose, but she feels like she has things under control and doesn't want to raise the dose. Mother-in-law (who lives with them) had a stroke last month.  Patient is now working 3 days at work, 1 day from home. MIL can no longer drive, she has to chauffeur her around.  Husband just took another part-time job on weekends (picking up golf balls; he is a trAdministrator  She is now locked into her current job (had been looking elsewhere), due to the flexibility and ability to care for her mother-in-law. Working out with a trClinical research associatex/week for the last 2 months.  She feels like she is handling things well, knows what she signed up for when said mother-in-law could move in with them. Now driving her around more than she expected (since she can no longer drive).  Deficits of MIL are speech, balance, has some dementia as well. She now manages her medications.  Hyperlipidemia--She tries to follow lowfat, low cholesterol diet.  In January, when LDL was up to 155, she had been eating red meat 3x/week --cut back to 1x/week, tacos prior to last check (LDL 146). She doesn't eat eggs, tries to limit cheese, just on salads (and tacos, small amount).  She cut out alfredo sauce and pizza (very rare). She is now eating more fruits as snacks (apples, grapes). She has never taken lipid-lower medications. Last lipids were: Cholesterol, Total 100 - 199 mg/dL 219High    Triglycerides 0 - 149 mg/dL 77    HDL >39 mg/dL 58   VLDL Cholesterol Cal 5 - 40 mg/dL 15   LDL Calculated 0 - 99 mg/dL 146High    Chol/HDL Ratio 0.0 - 4.4 ratio 3.8    She had labs done prior to today's visit, see below.   PMH, PSH, SH reviewed  Outpatient Encounter Medications as of 08/08/2018  Medication Sig  . amLODipine (NORVASC) 10 MG tablet Take 1 tablet (10 mg total) by mouth daily.  . cholecalciferol (VITAMIN D) 1000 units tablet Take 2,000 Units by mouth daily.  . citalopram (CELEXA) 40 MG tablet Take 0.5 tablets (20 mg total) by mouth daily.  . Marland Kitchenosartan-hydrochlorothiazide (HYZAAR) 100-25 MG tablet Take 1 tablet by mouth daily.  . RABEprazole (ACIPHEX) 20 MG tablet Take 20 mg by mouth daily.   No facility-administered encounter medications on file as of 08/08/2018.    No Known Allergies  ROS: no fever, chills, URI symptoms, headaches, dizziness, chest pain, GI or GU complaints, edema, bleeding, bruising, rash. +stress   PHYSICAL EXAM:  BP 138/88   Pulse 72   Ht '5\' 6"'  (1.676 m)   Wt 195 lb 9.6 oz (88.7 kg)   BMI 31.57 kg/m    134/84 on repeat by MD  Wt Readings from Last 3 Encounters:  08/08/18 195 lb 9.6 oz (88.7 kg)  05/06/18 197 lb (89.4 kg)  01/30/18 200 lb 9.6 oz (91 kg)    Well developed, pleasant female, in no distress HEENT: PERRL, EOMI, conjunctiva  and sclera clear Neck: no lymphadenopathy, thyromegaly or carotid bruit Heart: regular rate and rhythm without murmur Lungs: clear bilaterally Back: no CVA or spinal tenderness Abdomen: soft, nontender, no organomegaly or mass Extremities: no edema, 2+ pulses Skin: normal turgor, no rashes/lesions Neuro: alert and oriented, cranial nerves intact, normal gait, strength Psych: normal mood, affect, hygiene and grooming, normal eye contact and speech   Labs prior to visit:  Lab Results  Component Value Date   CHOL 214 (H) 08/06/2018   HDL 57 08/06/2018   LDLCALC 140 (H) 08/06/2018   TRIG 86 08/06/2018   CHOLHDL 3.8  08/06/2018     Chemistry      Component Value Date/Time   NA 136 08/06/2018 0855   K 4.9 08/06/2018 0855   CL 98 08/06/2018 0855   CO2 24 08/06/2018 0855   BUN 14 08/06/2018 0855   CREATININE 1.13 (H) 08/06/2018 0855   CREATININE 1.06 (H) 10/22/2017 0823      Component Value Date/Time   CALCIUM 10.1 08/06/2018 0855   ALKPHOS 88 08/06/2018 0855   AST 17 08/06/2018 0855   ALT 17 08/06/2018 0855   BILITOT 0.6 08/06/2018 0855     Fasting glu 102  Lab Results  Component Value Date   HGBA1C 5.5 08/06/2018    ASSESSMENT/PLAN:  Essential (primary) hypertension - borderline control; continue current meds, stress reduction, exercise, low Na diet. Wt loss encouraged  Pure hypercholesterolemia - borderline. Cont lowfat, low cholesterol diet, regular exercise  Need for shingles vaccine - Plan: Varicella-zoster vaccine IM (Shingrix)  Impaired fasting glucose - mild; continue low sugar, low-carb diet, daily exercise and weight loss  Need for influenza vaccination - Plan: Flu Vaccine QUAD 6+ mos PF IM (Fluarix Quad PF)  Anxiety--continue 21m citalopram--discussed increasing if increasing stress, and higher BP's noted related to stress.   Health maintenance update-- Hasn't seen GYN.  Thinks last pap was the one noted in computer, 2015. Last mammo was 04/2017  F/u 6 months for CPE--we will do breast/pelvic/pap here Labs prior to visit: CBC, c-met, lipid, TSH    Continue your current medications. Continue lowfat, low cholesterol diet and regular exercise. Consider increasing to the 46mcitalopram if increasing irritability, anxiety, mood changes, especially if you notice higher blood pressures related to feeling stressed at home or work. Continue to monitor blood pressures--goal is <130-135/80-85.  Please schedule your mammogram. Schedule physical with pap here in 6 months.

## 2018-08-08 ENCOUNTER — Encounter: Payer: Self-pay | Admitting: Family Medicine

## 2018-08-08 ENCOUNTER — Ambulatory Visit (INDEPENDENT_AMBULATORY_CARE_PROVIDER_SITE_OTHER): Payer: 59 | Admitting: Family Medicine

## 2018-08-08 VITALS — BP 134/84 | HR 72 | Ht 66.0 in | Wt 195.6 lb

## 2018-08-08 DIAGNOSIS — Z23 Encounter for immunization: Secondary | ICD-10-CM | POA: Diagnosis not present

## 2018-08-08 DIAGNOSIS — Z5181 Encounter for therapeutic drug level monitoring: Secondary | ICD-10-CM

## 2018-08-08 DIAGNOSIS — R7301 Impaired fasting glucose: Secondary | ICD-10-CM | POA: Diagnosis not present

## 2018-08-08 DIAGNOSIS — I10 Essential (primary) hypertension: Secondary | ICD-10-CM | POA: Diagnosis not present

## 2018-08-08 DIAGNOSIS — F419 Anxiety disorder, unspecified: Secondary | ICD-10-CM

## 2018-08-08 DIAGNOSIS — E7800 Pure hypercholesterolemia, unspecified: Secondary | ICD-10-CM | POA: Diagnosis not present

## 2018-08-08 NOTE — Patient Instructions (Signed)
  Continue your current medications. Continue lowfat, low cholesterol diet and regular exercise. Consider increasing to the 40mg  citalopram if increasing irritability, anxiety, mood changes, especially if you notice higher blood pressures related to feeling stressed at home or work. Continue to monitor blood pressures--goal is <130-135/80-85.  Please schedule your mammogram. Schedule physical with pap here in 6 months.

## 2018-09-02 ENCOUNTER — Encounter: Payer: Self-pay | Admitting: Family Medicine

## 2018-09-02 ENCOUNTER — Ambulatory Visit (INDEPENDENT_AMBULATORY_CARE_PROVIDER_SITE_OTHER): Payer: 59 | Admitting: Family Medicine

## 2018-09-02 VITALS — BP 120/80 | HR 80 | Temp 98.2°F | Ht 66.0 in | Wt 196.4 lb

## 2018-09-02 DIAGNOSIS — J019 Acute sinusitis, unspecified: Secondary | ICD-10-CM

## 2018-09-02 DIAGNOSIS — R059 Cough, unspecified: Secondary | ICD-10-CM

## 2018-09-02 DIAGNOSIS — R05 Cough: Secondary | ICD-10-CM | POA: Diagnosis not present

## 2018-09-02 MED ORDER — BENZONATATE 200 MG PO CAPS: 200.0000 mg | ORAL_CAPSULE | Freq: Three times a day (TID) | ORAL | 0 refills | Status: DC | PRN

## 2018-09-02 MED ORDER — AMOXICILLIN-POT CLAVULANATE 875-125 MG PO TABS: 1.0000 | ORAL_TABLET | Freq: Two times a day (BID) | ORAL | 0 refills | Status: DC

## 2018-09-02 NOTE — Patient Instructions (Signed)
  Take antibiotic twice daily for 10 days. Use a probiotic if you develop diarrhea. Make sure that you are taking guaifenesin (I don't think it is in your coridicin, may need to get a separate mucinex, single ingredient). This is an expectorant to thin out the phlegm. Consider doing sinus rinses also.  Continue the coricidin HBP. Take the benzonatate if needed for cough.

## 2018-09-02 NOTE — Progress Notes (Signed)
Chief Complaint  Patient presents with  . Cough    x 1 week, mucus is yellowish/green. Chest congestion, no nasal congestion. No fever, chills or body aches. Just fatigue. Taking coricidin and delsym(delsym gave her a blackout type reaction and made her cough worse).    Started 1 week ago, hasn't gotten any better.  Has persistent cough, yellow-green nasal drainage, fatigue.   Had some fever initially (tactile, with chills) for the first 3 days, resolved.  Denies sinus pain, ear pain. ST initially, resolved, but feels dry. Denies wheezing, shortness of breath. Has been sleeping a lot.  She took Coricidin HBP cold and cough, helped some initially.  Took Delsym yesterday (not overlapping with Coricidin).  Almost passed out while coughing yesterday. She was up all night coughing last night.  Denies sick contacts.  Her last day of work was last week.  Stopped working to help care for her mother-in-law.  PMH, PSH, SH reviewed  Outpatient Encounter Medications as of 09/02/2018  Medication Sig  . amLODipine (NORVASC) 10 MG tablet Take 1 tablet (10 mg total) by mouth daily.  . cholecalciferol (VITAMIN D) 1000 units tablet Take 2,000 Units by mouth daily.  . citalopram (CELEXA) 40 MG tablet Take 0.5 tablets (20 mg total) by mouth daily.  Marland Kitchen DM-APAP-CPM (CORICIDIN HBP PO) Take 1-2 tablets by mouth every 4 (four) hours.  Marland Kitchen losartan-hydrochlorothiazide (HYZAAR) 100-25 MG tablet Take 1 tablet by mouth daily.  . RABEprazole (ACIPHEX) 20 MG tablet Take 20 mg by mouth daily.   No facility-administered encounter medications on file as of 09/02/2018.    No Known Allergies  ROS: Fever/chills resolved.  Some nausea, no vomiting or diarrhea. No bleeding, bruising or rash, no urinary complaints.  No myalgias. URI symptoms per HPI.    PHYSICAL EXAM:  BP 120/80   Pulse 80   Temp 98.2 F (36.8 C) (Tympanic)   Ht 5\' 6"  (1.676 m)   Wt 196 lb 6.4 oz (89.1 kg)   BMI 31.70 kg/m   Pleasant female, in  no distress.  Occasional deep cough and some sniffling HEENT: PERRL, EOMI, conjunctiva and sclera are clear. TM's and EAC's are normal. Nasal mucosa is moderately edematous, erythematous on the left. White mucus.  Sinuses are nontender. OP is clear Neck: no lymphadenopathy or mass Heart: regular rate and rhythm Lungs clear bilaterally without wheezes, rales, ronchi Skin: normal turgor, no rash Psych: normal mood, affect, hygiene and grooming Neuro: alert and oriented, cranial nerves intact, normal gait  ASSESSMENT/PLAN:  Acute non-recurrent sinusitis, unspecified location - Plan: amoxicillin-clavulanate (AUGMENTIN) 875-125 MG tablet  Cough - Plan: benzonatate (TESSALON) 200 MG capsule   Take antibiotic twice daily for 10 days. Use a probiotic if you develop diarrhea. Make sure that you are taking guaifenesin (I don't think it is in your coridicin, may need to get a separate mucinex, single ingredient). This is an expectorant to thin out the phlegm. Consider doing sinus rinses also.  Continue the coricidin HBP. Take the benzonatate if needed for cough.

## 2018-10-28 ENCOUNTER — Other Ambulatory Visit: Payer: Self-pay | Admitting: Family Medicine

## 2018-10-28 DIAGNOSIS — F419 Anxiety disorder, unspecified: Secondary | ICD-10-CM

## 2018-12-03 NOTE — Progress Notes (Signed)
Chief Complaint  Patient presents with  . Cough    started 3 weeks ago. Congestion, fatigue, no fevers, no chills or body aches. Mucus is clear.    3-4 weeks ago she started with URI symptoms. Cough started last week, worse over the weekend.  Nasal congestion has improved, but still has some head congestion.  Runny nose is clear.  Cough is nonproductive. Last night she didn't cough at all.  Has been coughing more at night in general.   Getting winded with activity.  Worked out last night for an hour with her trainer--coughed and was short of breath, but was able to complete her workout.  Denies sinus pain, no ear pain.  Sore throat resolved.  +tickle in the throat.  Hears some wheezing when she breathes sometimes. Sometimes gets lightheaded when she coughs.  Taking Coricidin HBP--started with cold and cough (chlorpheniramine and dextramethorphan), changed to chest congestion and cough 6 days ago (DM and guaifenesin).  Feeling better yesterday and today.   H/o frequent bronchitis as a child. Previously suggested inhaler for similar problem, but insurance didn't cover.  Last seen in mid-November, treated for sinus infection with Augmentin  BP's at home and [facility] have been 120's, all good. She is no longer working, caring for her mother-in-law in her home.  PMH, PSH, SH reviewed  Outpatient Encounter Medications as of 12/04/2018  Medication Sig  . amLODipine (NORVASC) 10 MG tablet Take 1 tablet (10 mg total) by mouth daily.  . cholecalciferol (VITAMIN D) 1000 units tablet Take 2,000 Units by mouth daily.  . citalopram (CELEXA) 40 MG tablet Take 0.5 tablets (20 mg total) by mouth daily.  Marland Kitchen DM-APAP-CPM (CORICIDIN HBP PO) Take 1-2 tablets by mouth every 4 (four) hours.  Marland Kitchen losartan-hydrochlorothiazide (HYZAAR) 100-25 MG tablet Take 1 tablet by mouth daily.  . [DISCONTINUED] amLODipine (NORVASC) 10 MG tablet Take 1 tablet (10 mg total) by mouth daily.  . [DISCONTINUED] citalopram (CELEXA) 40 MG  tablet TAKE 0.5 TABLETS (20 MG TOTAL) BY MOUTH DAILY.  . [DISCONTINUED] losartan-hydrochlorothiazide (HYZAAR) 100-25 MG tablet Take 1 tablet by mouth daily.  . [DISCONTINUED] RABEprazole (ACIPHEX) 20 MG tablet Take 20 mg by mouth daily.  . predniSONE (DELTASONE) 20 MG tablet Take 1 tablet (20 mg total) by mouth 2 (two) times daily with a meal.  . [DISCONTINUED] amoxicillin-clavulanate (AUGMENTIN) 875-125 MG tablet Take 1 tablet by mouth 2 (two) times daily.  . [DISCONTINUED] benzonatate (TESSALON) 200 MG capsule Take 1 capsule (200 mg total) by mouth 3 (three) times daily as needed.   No facility-administered encounter medications on file as of 12/04/2018.    (not taking prednisone prior to today's visit)  No Known Allergies  ROS: No nausea, vomiting, diarrhea, no headaches, chest pain. Dizziness with coughing just recently only.  No fever, chills.  Denies edema, bleeding, bruising or rashes.  See HPI  PHYSICAL EXAM:  BP 138/86   Pulse 80   Temp (!) 96.7 F (35.9 C) (Tympanic)   Ht 5\' 6"  (1.676 m)   Wt 189 lb 6.4 oz (85.9 kg)   BMI 30.57 kg/m   Wt Readings from Last 3 Encounters:  12/04/18 189 lb 6.4 oz (85.9 kg)  09/02/18 196 lb 6.4 oz (89.1 kg)  08/08/18 195 lb 9.6 oz (88.7 kg)    Well appearing, pleasant female, speaking easily in full sentences, intermittently having deep cough. Sometimes sounds dry, others sounded wet  HEENT:  PERRL, EOMI, conjunctiva and sclera are clear, TM's and EAC's normal. Nasal  mucosa only mildly edematous, no drainage noted, no erythema. Sinuses are nontender. OP is clear Neck: no lymphadenopathy or mass Heart: regular rate and rhythm, no murmur Lungs: clear bilaterally, no wheezes, rales, ronchi. Skin: normal turgor, no rash Neuro: alert and oriented, cranial nerves intact, normal gait Psych: normal mood, affect, hygiene and grooming  Peak Flow: 350, 340, 345  ASSESSMENT/PLAN:  Cough - Plan: predniSONE (DELTASONE) 20 MG tablet  Reactive  airway disease with acute exacerbation, unspecified asthma severity, unspecified whether persistent - Plan: predniSONE (DELTASONE) 20 MG tablet  Viral upper respiratory infection  Risks/SE of prednisone reviewed.  S/sx bacterial infection reviewed. F/u if sx persist or worsen   Drink plenty of water Take prednisone 1 pill twice daily for 5 days. If you are still having shortness of breath with exertion, we may need to also start an inhaler. Continue the guaifenesin and dextromethorphan (your current coricidin HBP) Since you started back with a runny nose, consider taking an antihistamine such as claritin or zyrtec. You may use the benzonatate you have at home, if needed. Contact us if you have fever, worsening shortness of breath, or discolored mucus or phlegm develop.

## 2018-12-04 ENCOUNTER — Other Ambulatory Visit: Payer: Self-pay | Admitting: *Deleted

## 2018-12-04 ENCOUNTER — Encounter: Payer: Self-pay | Admitting: Family Medicine

## 2018-12-04 ENCOUNTER — Ambulatory Visit (INDEPENDENT_AMBULATORY_CARE_PROVIDER_SITE_OTHER): Admitting: Family Medicine

## 2018-12-04 ENCOUNTER — Ambulatory Visit: Payer: 59 | Admitting: Family Medicine

## 2018-12-04 VITALS — BP 138/86 | HR 80 | Temp 96.7°F | Ht 66.0 in | Wt 189.4 lb

## 2018-12-04 DIAGNOSIS — R05 Cough: Secondary | ICD-10-CM

## 2018-12-04 DIAGNOSIS — J45901 Unspecified asthma with (acute) exacerbation: Secondary | ICD-10-CM | POA: Diagnosis not present

## 2018-12-04 DIAGNOSIS — R059 Cough, unspecified: Secondary | ICD-10-CM

## 2018-12-04 DIAGNOSIS — J069 Acute upper respiratory infection, unspecified: Secondary | ICD-10-CM

## 2018-12-04 DIAGNOSIS — I10 Essential (primary) hypertension: Secondary | ICD-10-CM

## 2018-12-04 DIAGNOSIS — F419 Anxiety disorder, unspecified: Secondary | ICD-10-CM

## 2018-12-04 MED ORDER — PREDNISONE 20 MG PO TABS: 20.0000 mg | ORAL_TABLET | Freq: Two times a day (BID) | ORAL | 0 refills | Status: DC

## 2018-12-04 MED ORDER — LOSARTAN POTASSIUM-HCTZ 100-25 MG PO TABS
1.0000 | ORAL_TABLET | Freq: Every day | ORAL | 0 refills | Status: DC
Start: 2018-12-04 — End: 2019-02-28

## 2018-12-04 MED ORDER — CITALOPRAM HYDROBROMIDE 40 MG PO TABS: 20.0000 mg | ORAL_TABLET | Freq: Every day | ORAL | 0 refills | Status: DC

## 2018-12-04 MED ORDER — AMLODIPINE BESYLATE 10 MG PO TABS: 10.0000 mg | ORAL_TABLET | Freq: Every day | ORAL | 0 refills | Status: DC

## 2018-12-04 NOTE — Patient Instructions (Signed)
Drink plenty of water Take prednisone 1 pill twice daily for 5 days. If you are still having shortness of breath with exertion, we may need to also start an inhaler. Continue the guaifenesin and dextromethorphan (your current coricidin HBP) Since you started back with a runny nose, consider taking an antihistamine such as claritin or zyrtec. You may use the benzonatate you have at home, if needed. Contact us if you have fever, worsening shortness of breath, or discolored mucus or phlegm develop.

## 2019-01-22 ENCOUNTER — Encounter: Admitting: Family Medicine

## 2019-02-28 ENCOUNTER — Other Ambulatory Visit: Payer: Self-pay

## 2019-02-28 ENCOUNTER — Telehealth: Payer: Self-pay | Admitting: Family Medicine

## 2019-02-28 DIAGNOSIS — I10 Essential (primary) hypertension: Secondary | ICD-10-CM

## 2019-02-28 MED ORDER — LOSARTAN POTASSIUM-HCTZ 100-25 MG PO TABS: 1.0000 | ORAL_TABLET | Freq: Every day | ORAL | 0 refills | Status: DC

## 2019-02-28 MED ORDER — AMLODIPINE BESYLATE 10 MG PO TABS: 10.0000 mg | ORAL_TABLET | Freq: Every day | ORAL | 0 refills | Status: DC

## 2019-02-28 NOTE — Telephone Encounter (Signed)
Pt was advised Carol Thomas 

## 2019-02-28 NOTE — Telephone Encounter (Signed)
Pt called and is in need of refills for norvasc and losartan she has a cpe,on may the 28th, pt uses [facility] [place] [facility] [address]

## 2019-03-11 ENCOUNTER — Other Ambulatory Visit: Payer: Self-pay

## 2019-03-11 ENCOUNTER — Other Ambulatory Visit

## 2019-03-11 DIAGNOSIS — E7800 Pure hypercholesterolemia, unspecified: Secondary | ICD-10-CM

## 2019-03-11 DIAGNOSIS — F419 Anxiety disorder, unspecified: Secondary | ICD-10-CM

## 2019-03-11 DIAGNOSIS — Z5181 Encounter for therapeutic drug level monitoring: Secondary | ICD-10-CM

## 2019-03-11 NOTE — Progress Notes (Signed)
Start time: 9:30 End time: 10:02  Virtual Visit via Video Note  I connected with Carol Thomas on 03/13/2019 by a video enabled telemedicine application and verified that I am speaking with the correct person using two identifiers.  Location: Patient: at home, in room alone Provider: home office   I discussed the limitations of evaluation and management by telemedicine and the availability of in person appointments. The patient expressed understanding and agreed to proceed. She consents to filing this visit with her insurance.  History of Present Illness:  Chief Complaint  Patient presents with  . Hypertension    VIRTUAL med check. No concerns.     Patient presents for med check. She had labs done prior to her visit, see below.  Hypertension. She continues on amlodipine 10mg  and losartan-HCT 100-25.  BP's over the last month have been running 118-146/70-80 P64-72 (146/80 once, w/comment "dealing with MIL", recheck 20 mins later 134/75), mostly 120's-134. Denies headaches, dizziness, chest pain, palpitations, shortness of breath, edema.  She has been working out with Physiological scientist 2x/week, 45-60 mins. Weights, cardio.  Also works in her yard. + intentional weight loss--eating less, exercising regularly.  Anxiety: She is doing well on citalopram 20mg  (takes 1/2 of 40 mg tablet).  She is no longer working, is caring for her mother-in-law in her home (who had a stroke, with deficits in speech, balance, and has some dementia). Working out helps with her moods, as does talking to her cousin (able to vent). Denies trouble relaxing. MIL was put on SSRI also, which has helped with her irritability.  Hyperlipidemia--She tries to follow lowfat, low cholesterol diet.  In January 2019, when LDL was up to 155, she had been eating red meat 3x/week--cut back to 1x/week.Per last visit: She doesn't eat eggs, tries to limit cheese, just on salads (and tacos, small amount). She cut out alfredo  sauce and pizza (very rare). She is now eating more fruits as snacks (apples, grapes).She has never taken lipid-lower medications. Last LDL (07/2018) was 140; she had lipids repeated prior to today's visit, see below. She doesn't report significant changes to diet.   PMH, PSH, SH reviewed  Outpatient Encounter Medications as of 03/13/2019  Medication Sig  . amLODipine (NORVASC) 10 MG tablet Take 1 tablet (10 mg total) by mouth daily.  . cholecalciferol (VITAMIN D) 1000 units tablet Take 2,000 Units by mouth daily.  . citalopram (CELEXA) 20 MG tablet Take 1 tablet (20 mg total) by mouth daily.  Marland Kitchen losartan-hydrochlorothiazide (HYZAAR) 100-25 MG tablet Take 1 tablet by mouth daily.  . [DISCONTINUED] citalopram (CELEXA) 40 MG tablet Take 0.5 tablets (20 mg total) by mouth daily.  . [DISCONTINUED] DM-APAP-CPM (CORICIDIN HBP PO) Take 1-2 tablets by mouth every 4 (four) hours.  . [DISCONTINUED] predniSONE (DELTASONE) 20 MG tablet Take 1 tablet (20 mg total) by mouth 2 (two) times daily with a meal.   No facility-administered encounter medications on file as of 03/13/2019.    No Known Allergies  ROS: no fever, chills, headaches, dizziness, chest pain, shortness of breath, URI symptoms.  No edema.  +weight loss (over 15# in the last 6 months) She is having some heartburn this morning (doesn't get often at all; has hx, has been off meds x years)   Observations/Objective:  BP (!) 143/87   Pulse 61   Ht 5\' 6"  (1.676 m)   Wt 178 lb 12.8 oz (81.1 kg)   BMI 28.86 kg/m  (weight per home scale today)  144/86  on repeat during her visit, with her home monitor.  Wt Readings from Last 3 Encounters:  12/04/18 189 lb 6.4 oz (85.9 kg)  09/02/18 196 lb 6.4 oz (89.1 kg)  08/08/18 195 lb 9.6 oz (88.7 kg)    Exam is limited due to the virtual nature of the visit. She is alert, oriented, grossly normal cranial nerves. She is in good spirits, normal mood, affect, grooming.  ASCVD risk calculator  6.6% (borderline)     Chemistry      Component Value Date/Time   NA 138 03/11/2019 0845   K 4.4 03/11/2019 0845   CL 97 03/11/2019 0845   CO2 22 03/11/2019 0845   BUN 13 03/11/2019 0845   CREATININE 1.20 (H) 03/11/2019 0845   CREATININE 1.06 (H) 10/22/2017 0823      Component Value Date/Time   CALCIUM 10.2 03/11/2019 0845   ALKPHOS 94 03/11/2019 0845   AST 17 03/11/2019 0845   ALT 17 03/11/2019 0845   BILITOT 0.5 03/11/2019 0845     Fasting glucose 103  Lab Results  Component Value Date   CHOL 225 (H) 03/11/2019   HDL 56 03/11/2019   LDLCALC 152 (H) 03/11/2019   TRIG 84 03/11/2019   CHOLHDL 4.0 03/11/2019   Lab Results  Component Value Date   WBC 3.5 03/11/2019   HGB 13.4 03/11/2019   HCT 37.8 03/11/2019   MCV 85 03/11/2019   PLT 174 03/11/2019   Lab Results  Component Value Date   TSH 3.120 03/11/2019    Assessment and Plan:  Essential (primary) hypertension - elvated today, but overall has been well controlled (home BP list reviewed; rarely elevated, related to stress/illness). Cont current regimen  Pure hypercholesterolemia - Discussed risks/diet/statin r/b in detail; Prefers further dietary trial rather than meds. Recheck prior to CPE in October - Plan: Lipid panel  Anxiety disorder - controlled - Plan: citalopram (CELEXA) 20 MG tablet  Impaired fasting glucose - counseled re: diet, exercise in detail - Plan: Hemoglobin A1c, Glucose, random  LDL is back up again. Discussed risks/benefits of statins Prefers to work on diet; recheck lipid panel prior to her physical.  Slight bump in Cr noted. Avoid NSAIDs, continue to stay well hydrated. GERD precautions reviewed.  F/u in October for CPE as scheduled, with fasting labs prior.   Follow Up Instructions:  Advised how to access her AVS.  I discussed the assessment and treatment plan with the patient. The patient was provided an opportunity to ask questions and all were answered. The patient agreed  with the plan and demonstrated an understanding of the instructions.   The patient was advised to call back or seek an in-person evaluation if the symptoms worsen or if the condition fails to improve as anticipated.  I provided 32 minutes of non-face-to-face time during this encounter.   Vikki Ports, MD

## 2019-03-12 LAB — CBC WITH DIFFERENTIAL/PLATELET
Basophils Absolute: 0.1 10*3/uL (ref 0.0–0.2)
Basos: 2 %
EOS (ABSOLUTE): 0.2 10*3/uL (ref 0.0–0.4)
Eos: 5 %
Hematocrit: 37.8 % (ref 34.0–46.6)
Hemoglobin: 13.4 g/dL (ref 11.1–15.9)
Immature Grans (Abs): 0 10*3/uL (ref 0.0–0.1)
Immature Granulocytes: 0 %
Lymphocytes Absolute: 1 10*3/uL (ref 0.7–3.1)
Lymphs: 28 %
MCH: 30 pg (ref 26.6–33.0)
MCHC: 35.4 g/dL (ref 31.5–35.7)
MCV: 85 fL (ref 79–97)
Monocytes Absolute: 0.2 10*3/uL (ref 0.1–0.9)
Monocytes: 6 %
Neutrophils Absolute: 2.1 10*3/uL (ref 1.4–7.0)
Neutrophils: 59 %
Platelets: 174 10*3/uL (ref 150–450)
RBC: 4.46 x10E6/uL (ref 3.77–5.28)
RDW: 12.5 % (ref 11.7–15.4)
WBC: 3.5 10*3/uL (ref 3.4–10.8)

## 2019-03-12 LAB — LIPID PANEL
Chol/HDL Ratio: 4 ratio (ref 0.0–4.4)
Cholesterol, Total: 225 mg/dL — ABNORMAL HIGH (ref 100–199)
HDL: 56 mg/dL (ref >39)
LDL Calculated: 152 mg/dL — ABNORMAL HIGH (ref 0–99)
Triglycerides: 84 mg/dL (ref 0–149)
VLDL Cholesterol Cal: 17 mg/dL (ref 5–40)

## 2019-03-12 LAB — TSH: TSH: 3.12 u[IU]/mL (ref 0.450–4.500)

## 2019-03-13 ENCOUNTER — Encounter: Payer: Self-pay | Admitting: Family Medicine

## 2019-03-13 ENCOUNTER — Other Ambulatory Visit: Payer: Self-pay

## 2019-03-13 ENCOUNTER — Ambulatory Visit (INDEPENDENT_AMBULATORY_CARE_PROVIDER_SITE_OTHER): Admitting: Family Medicine

## 2019-03-13 VITALS — BP 143/87 | HR 61 | Ht 66.0 in | Wt 178.8 lb

## 2019-03-13 DIAGNOSIS — R7301 Impaired fasting glucose: Secondary | ICD-10-CM

## 2019-03-13 DIAGNOSIS — I10 Essential (primary) hypertension: Secondary | ICD-10-CM | POA: Diagnosis not present

## 2019-03-13 DIAGNOSIS — F419 Anxiety disorder, unspecified: Secondary | ICD-10-CM

## 2019-03-13 DIAGNOSIS — E7800 Pure hypercholesterolemia, unspecified: Secondary | ICD-10-CM

## 2019-03-13 MED ORDER — CITALOPRAM HYDROBROMIDE 20 MG PO TABS: 20.0000 mg | ORAL_TABLET | Freq: Every day | ORAL | 1 refills | Status: DC

## 2019-03-13 NOTE — Patient Instructions (Addendum)
We discussed your cholesterol being up again.  See information below, and we will recheck prior to your physical in October. I have a low threshold for starting a low dose statin, but if you can get it down (and keep it down!), that's fine.  We discussed a slight bump in your kidney test.  Be sure to stay well hydrated, and avoid anti-inflammatories (ibuprofen, motrin, aleve, Goody's, etc).    Food Choices for Gastroesophageal Reflux Disease, Adult When you have gastroesophageal reflux disease (GERD), the foods you eat and your eating habits are very important. Choosing the right foods can help ease the discomfort of GERD. Consider working with a diet and nutrition specialist (dietitian) to help you make healthy food choices. What general guidelines should I follow?  Eating plan  Choose healthy foods low in fat, such as fruits, vegetables, whole grains, low-fat dairy products, and lean meat, fish, and poultry.  Eat frequent, small meals instead of three large meals each day. Eat your meals slowly, in a relaxed setting. Avoid bending over or lying down until 2-3 hours after eating.  Limit high-fat foods such as fatty meats or fried foods.  Limit your intake of oils, butter, and shortening to less than 8 teaspoons each day.  Avoid the following: ? Foods that cause symptoms. These may be different for different people. Keep a food diary to keep track of foods that cause symptoms. ? Alcohol. ? Drinking large amounts of liquid with meals. ? Eating meals during the 2-3 hours before bed.  Cook foods using methods other than frying. This may include baking, grilling, or broiling. Lifestyle  Maintain a healthy weight. Ask your health care provider what weight is healthy for you. If you need to lose weight, work with your health care provider to do so safely.  Exercise for at least 30 minutes on 5 or more days each week, or as told by your health care provider.  Avoid wearing clothes that fit  tightly around your waist and chest.  Do not use any products that contain nicotine or tobacco, such as cigarettes and e-cigarettes. If you need help quitting, ask your health care provider.  Sleep with the head of your bed raised. Use a wedge under the mattress or blocks under the bed frame to raise the head of the bed. What foods are not recommended? The items listed may not be a complete list. Talk with your dietitian about what dietary choices are best for you. Grains Pastries or quick breads with added fat. [place] toast. Vegetables Deep fried vegetables. [place] fries. Any vegetables prepared with added fat. Any vegetables that cause symptoms. For some people this may include tomatoes and tomato products, chili peppers, onions and garlic, and horseradish. Fruits Any fruits prepared with added fat. Any fruits that cause symptoms. For some people this may include citrus fruits, such as oranges, grapefruit, pineapple, and lemons. Meats and other protein foods High-fat meats, such as fatty beef or pork, hot dogs, ribs, ham, sausage, salami and bacon. Fried meat or protein, including fried fish and fried chicken. Nuts and nut butters. Dairy Whole milk and chocolate milk. Sour cream. Cream. Ice cream. Cream cheese. Milk shakes. Beverages Coffee and tea, with or without caffeine. Carbonated beverages. Sodas. Energy drinks. Fruit juice made with acidic fruits (such as orange or grapefruit). Tomato juice. Alcoholic drinks. Fats and oils Butter. Margarine. Shortening. Ghee. Sweets and desserts Chocolate and cocoa. Donuts. Seasoning and other foods Pepper. Peppermint and spearmint. Any condiments, herbs, or seasonings  that cause symptoms. For some people, this may include curry, hot sauce, or vinegar-based salad dressings. Summary  When you have gastroesophageal reflux disease (GERD), food and lifestyle choices are very important to help ease the discomfort of GERD.  Eat frequent, small meals  instead of three large meals each day. Eat your meals slowly, in a relaxed setting. Avoid bending over or lying down until 2-3 hours after eating.  Limit high-fat foods such as fatty meat or fried foods. This information is not intended to replace advice given to you by your health care provider. Make sure you discuss any questions you have with your health care provider. Document Released: 10/02/2005 Document Revised: 10/03/2016 Document Reviewed: 10/03/2016 Elsevier Interactive Patient Education  2019 [employer].   Cholesterol Content in Foods Cholesterol is a waxy, fat-like substance that helps to carry fat in the blood. The body needs cholesterol in small amounts, but too much cholesterol can cause damage to the arteries and heart. Most people should eat less than 200 milligrams (mg) of cholesterol a day. Foods with cholesterol  Cholesterol is found in animal-based foods, such as meat, seafood, and dairy. Generally, low-fat dairy and lean meats have less cholesterol than full-fat dairy and fatty meats. The milligrams of cholesterol per serving (mg per serving) of common cholesterol-containing foods are listed below. Meat and other proteins  Egg -- one large whole egg has 186 mg.  Veal shank -- 4 oz has 141 mg.  Lean ground [place] (93% lean) -- 4 oz has 118 mg.  Fat-trimmed lamb loin -- 4 oz has 106 mg.  Lean ground beef (90% lean) -- 4 oz has 100 mg.  Lobster -- 3.5 oz has 90 mg.  Pork loin chops -- 4 oz has 86 mg.  Canned salmon -- 3.5 oz has 83 mg.  Fat-trimmed beef top loin -- 4 oz has 78 mg.  Frankfurter -- 1 frank (3.5 oz) has 77 mg.  Crab -- 3.5 oz has 71 mg.  Roasted chicken without skin, white meat -- 4 oz has 66 mg.  Light bologna -- 2 oz has 45 mg.  Deli-cut [place] -- 2 oz has 31 mg.  Canned tuna -- 3.5 oz has 31 mg.  Berniece Salines -- 1 oz has 29 mg.  Oysters and mussels (raw) -- 3.5 oz has 25 mg.  Mackerel -- 1 oz has 22 mg.  Trout -- 1 oz has 20  mg.  Pork sausage -- 1 link (1 oz) has 17 mg.  Salmon -- 1 oz has 16 mg.  Tilapia -- 1 oz has 14 mg. Dairy  Soft-serve ice cream --  cup (4 oz) has 103 mg.  Whole-milk yogurt -- 1 cup (8 oz) has 29 mg.  Cheddar cheese -- 1 oz has 28 mg.  American cheese -- 1 oz has 28 mg.  Whole milk -- 1 cup (8 oz) has 23 mg.  2% milk -- 1 cup (8 oz) has 18 mg.  Cream cheese -- 1 tablespoon (Tbsp) has 15 mg.  Cottage cheese --  cup (4 oz) has 14 mg.  Low-fat (1%) milk -- 1 cup (8 oz) has 10 mg.  Sour cream -- 1 Tbsp has 8.5 mg.  Low-fat yogurt -- 1 cup (8 oz) has 8 mg.  Nonfat Greek yogurt -- 1 cup (8 oz) has 7 mg.  Half-and-half cream -- 1 Tbsp has 5 mg. Fats and oils  Cod liver oil -- 1 tablespoon (Tbsp) has 82 mg.  Butter -- 1 Tbsp  has 15 mg.  Lard -- 1 Tbsp has 14 mg.  Bacon grease -- 1 Tbsp has 14 mg.  Mayonnaise -- 1 Tbsp has 5-10 mg.  Margarine -- 1 Tbsp has 3-10 mg. Exact amounts of cholesterol in these foods may vary depending on specific ingredients and brands. Foods without cholesterol Most plant-based foods do not have cholesterol unless you combine them with a food that has cholesterol. Foods without cholesterol include:  Grains and cereals.  Vegetables.  Fruits.  Vegetable oils, such as olive, canola, and sunflower oil.  Legumes, such as peas, beans, and lentils.  Nuts and seeds.  Egg whites. Summary  The body needs cholesterol in small amounts, but too much cholesterol can cause damage to the arteries and heart.  Most people should eat less than 200 milligrams (mg) of cholesterol a day. This information is not intended to replace advice given to you by your health care provider. Make sure you discuss any questions you have with your health care provider. Document Released: 05/29/2017 Document Revised: 05/29/2017 Document Reviewed: 05/29/2017 Elsevier Interactive Patient Education  [employer].

## 2019-03-13 NOTE — Progress Notes (Signed)
Pt is coming in October the 5th

## 2019-03-18 LAB — COMPREHENSIVE METABOLIC PANEL
ALT: 17 IU/L (ref 0–32)
AST: 17 IU/L (ref 0–40)
Albumin/Globulin Ratio: 2 (ref 1.2–2.2)
Albumin: 4.5 g/dL (ref 3.8–4.8)
Alkaline Phosphatase: 94 IU/L (ref 39–117)
BUN/Creatinine Ratio: 11 — ABNORMAL LOW (ref 12–28)
BUN: 13 mg/dL (ref 8–27)
Bilirubin Total: 0.5 mg/dL (ref 0.0–1.2)
CO2: 22 mmol/L (ref 20–29)
Calcium: 10.2 mg/dL (ref 8.7–10.3)
Chloride: 97 mmol/L (ref 96–106)
Creatinine, Ser: 1.2 mg/dL — ABNORMAL HIGH (ref 0.57–1.00)
GFR calc Af Amer: 56 mL/min/{1.73_m2} — ABNORMAL LOW (ref >59)
GFR calc non Af Amer: 49 mL/min/{1.73_m2} — ABNORMAL LOW (ref >59)
Globulin, Total: 2.2 g/dL (ref 1.5–4.5)
Glucose: 103 mg/dL — ABNORMAL HIGH (ref 65–99)
Potassium: 4.4 mmol/L (ref 3.5–5.2)
Sodium: 138 mmol/L (ref 134–144)
Total Protein: 6.7 g/dL (ref 6.0–8.5)

## 2019-03-18 LAB — SPECIMEN STATUS REPORT

## 2019-06-07 ENCOUNTER — Other Ambulatory Visit: Payer: Self-pay | Admitting: Family Medicine

## 2019-06-07 DIAGNOSIS — I10 Essential (primary) hypertension: Secondary | ICD-10-CM

## 2019-07-15 ENCOUNTER — Encounter: Payer: Self-pay | Admitting: Gynecology

## 2019-07-21 ENCOUNTER — Telehealth: Payer: Self-pay | Admitting: Family Medicine

## 2019-07-21 ENCOUNTER — Encounter: Payer: Self-pay | Admitting: *Deleted

## 2019-07-21 ENCOUNTER — Other Ambulatory Visit: Payer: Self-pay

## 2019-07-21 ENCOUNTER — Other Ambulatory Visit

## 2019-07-21 DIAGNOSIS — R7301 Impaired fasting glucose: Secondary | ICD-10-CM

## 2019-07-21 DIAGNOSIS — E7800 Pure hypercholesterolemia, unspecified: Secondary | ICD-10-CM

## 2019-07-22 LAB — HEMOGLOBIN A1C
Est. average glucose Bld gHb Est-mCnc: 111 mg/dL
Hgb A1c MFr Bld: 5.5 % (ref 4.8–5.6)

## 2019-07-22 LAB — LIPID PANEL
Chol/HDL Ratio: 3.4 ratio (ref 0.0–4.4)
Cholesterol, Total: 216 mg/dL — ABNORMAL HIGH (ref 100–199)
HDL: 63 mg/dL (ref >39)
LDL Chol Calc (NIH): 142 mg/dL — ABNORMAL HIGH (ref 0–99)
Triglycerides: 60 mg/dL (ref 0–149)
VLDL Cholesterol Cal: 11 mg/dL (ref 5–40)

## 2019-07-22 LAB — GLUCOSE, RANDOM: Glucose: 97 mg/dL (ref 65–99)

## 2019-07-23 NOTE — Patient Instructions (Addendum)
  HEALTH MAINTENANCE RECOMMENDATIONS:  It is recommended that you get at least 30 minutes of aerobic exercise at least 5 days/week (for weight loss, you may need as much as 60-90 minutes). This can be any activity that gets your heart rate up. This can be divided in 10-15 minute intervals if needed, but try and build up your endurance at least once a week.  Weight bearing exercise is also recommended twice weekly.  Eat a healthy diet with lots of vegetables, fruits and fiber.  "Colorful" foods have a lot of vitamins (ie green vegetables, tomatoes, red peppers, etc).  Limit sweet tea, regular sodas and alcoholic beverages, all of which has a lot of calories and sugar.  Up to 1 alcoholic drink daily may be beneficial for women (unless trying to lose weight, watch sugars).  Drink a lot of water.  Calcium recommendations are 1200-1500 mg daily (1500 mg for postmenopausal women or women without ovaries), and vitamin D 1000 IU daily.  This should be obtained from diet and/or supplements (vitamins), and calcium should not be taken all at once, but in divided doses.  Monthly self breast exams and yearly mammograms for women over the age of 28 is recommended.  Sunscreen of at least SPF 30 should be used on all sun-exposed parts of the skin when outside between the hours of 10 am and 4 pm (not just when at beach or pool, but even with exercise, golf, tennis, and yard work!)  Use a sunscreen that says "broad spectrum" so it covers both UVA and UVB rays, and make sure to reapply every 1-2 hours.  Remember to change the batteries in your smoke detectors when changing your clock times in the spring and fall. Carbon monoxide detectors are recommended for your home.  Use your seat belt every time you are in a car, and please drive safely and not be distracted with cell phones and texting while driving.  [facility] to schedule your routine mammogram (3D is preferred if covered by your insurance).  You are also due to have another bone density test . Please schedule both.

## 2019-07-23 NOTE — Progress Notes (Signed)
Chief Complaint  Patient presents with  . Annual Exam    non fasting no complaints with pelvic    Carol Thomas is a 63 y.o. female who presents for a complete physical.  She has the following concerns:  Hypertension. She continues on amlodipine 10mg  and losartan-HCT 100-25.  BP's have been running120.'s/70's Denies headaches (just sinus recently), dizziness, chest pain, palpitations, shortness of breath, edema. A couple of weeks ago she had some pain in her lower chest, which she attributed to reflux.  This resolved on its own. It was after eating, and woke up with it.  Denies exertional chest pain or dyspnea.  She continues to work out with Physiological scientist 2x/week, 45-60 mins. Weights, cardio.  Also works in her yard.  Anxiety: She is doing well on citalopram 20mg .  She is no longer working, is caring for her mother-in-law in her home (who had a stroke, with deficits in speech, balance, and has some dementia). Working out helps with her moods, as does talking to her cousin (able to vent). Denies trouble relaxing.  Hyperlipidemia--She tries to follow lowfat, low cholesterol diet. In January 2019, when LDL was up to 155, she had been eating red meat 3x/week--cut back to1x/week.Per last visit: She doesn't eat eggs, tries to limit cheese, just on salads (and tacos, small amount). She cut out alfredo sauce and pizza (very rare). She is now eating more fruits as snacks (apples, grapes)--admits today she isn't doing as well with fruits, snacks on potato chips (but doesn't snack often). She has never taken lipid-lowering medications.LDL in 07/2018 was 140.  LDL went up to 152 in 02/2019.  ASCVD risk calculator 6.6% (borderline).  She declined statins, preferred dietary trial. She had labs done prior to today's visit, see below.  Impaired fasting glucose:  Sugar was 103 in May.  A1c was normal.  She tries to limit sugar/sweets/carbs. She is exercising regularly.     Immunization History   Administered Date(s) Administered  . Hepatitis A 08/16/2006  . Hepatitis A, Adult 10/02/2006, 04/12/2016  . Influenza Inj Mdck Quad Pf 08/01/2017  . Influenza,inj,Quad PF,6+ Mos 10/10/2012, 08/19/2014, 09/08/2015, 10/18/2016, 08/08/2018  . Influenza-Unspecified 08/16/2017  . Tdap 10/02/2006, 04/12/2016  . Zoster Recombinat (Shingrix) 05/06/2018, 08/08/2018   Last Pap smear: 02/2014 Dr. Phineas Real; s/p hysterectomy in 1997 Last mammogram: 04/2017 Last colonoscopy: 06/2015 Dr. Penelope Coop.  Hemorrhoids, diverticulosis.  1 polyp (benign mucosa); has been getting every 5 years Last DEXA: 2014, low bone mass. Rec 2 year repeat (not done). Dentist: today (first time in 5 years) Ophtho: yearly, past due (due to Kelly). Exercise: She has been working out with Physiological scientist 2x/week, 45-60 mins. Weights, cardio.  Also works in her yard.  Past Medical History:  Diagnosis Date  . Adenomatous colon polyp 11/2008   Dr. Penelope Coop at [facility]  . Anxiety   . Cancer ([place]) 1998   Melanoma-Left leg  . Hypertension   . Osteopenia 10/2012   T score of -2.2 FRAX 8%/1.1%  . Vitamin D deficiency 10/2012   vitamin D level 10    Past Surgical History:  Procedure Laterality Date  . ABDOMINAL HYSTERECTOMY  1997   TAH  . APPENDECTOMY    . ESOPHAGOGASTRODUODENOSCOPY  05/24/2017   Erythema in the middle third of the esphagus and in lower third.-biopised erythematous mucosa in the antrum, normal examined duodenum  . Melanoma excised    . TUBAL LIGATION      Social History   Socioeconomic History  . Marital  status: Married    Spouse name: Not on file  . Number of children: 2  . Years of education: Not on file  . Highest education level: Not on file  Occupational History  . Occupation: Probation officer)  Social Needs  . Financial resource strain: Not on file  . Food insecurity    Worry: Not on file    Inability: Not on file  . Transportation needs    Medical: Not on file    Non-medical:  Not on file  Tobacco Use  . Smoking status: Never Smoker  . Smokeless tobacco: Never Used  Substance and Sexual Activity  . Alcohol use: No  . Drug use: No  . Sexual activity: Not Currently    Birth control/protection: Surgical  Lifestyle  . Physical activity    Days per week: Not on file    Minutes per session: Not on file  . Stress: Not on file  Relationships  . Social Herbalist on phone: Not on file    Gets together: Not on file    Attends religious service: Not on file    Active member of club or organization: Not on file    Attends meetings of clubs or organizations: Not on file    Relationship status: Not on file  . Intimate partner violence    Fear of current or ex partner: Not on file    Emotionally abused: Not on file    Physically abused: Not on file    Forced sexual activity: Not on file  Other Topics Concern  . Not on file  Social History Narrative   Married.  Lives with husband, mother-in-law, 1 dog, 1 cat.    Customer service rep, stressful job--left job in 08/2018. To help with mother-in-law.   Mother-in-law moved in with them 2019, had a stroke 06/2018 (aphasia), and has vascular dementia    Family History  Problem Relation Age of Onset  . Hypertension Mother   . Diabetes Mother   . Osteoporosis Mother   . Thyroid disease Mother   . COPD Mother   . Hyperlipidemia Mother   . Heart attack Mother 41  . Stroke Daughter        during pregnancy  . Depression Daughter   . Bipolar disorder Son   . Hyperlipidemia Sister   . Colon cancer Maternal Uncle        40's    Outpatient Encounter Medications as of 07/24/2019  Medication Sig  . amLODipine (NORVASC) 10 MG tablet TAKE 1 TABLET(10 MG) BY MOUTH DAILY  . cholecalciferol (VITAMIN D) 1000 units tablet Take 2,000 Units by mouth daily.  . citalopram (CELEXA) 20 MG tablet Take 1 tablet (20 mg total) by mouth daily.  Marland Kitchen losartan-hydrochlorothiazide (HYZAAR) 100-25 MG tablet TAKE 1 TABLET BY MOUTH  DAILY  . desonide (DESOWEN) 0.05 % ointment APP EXT TO THE SKIN BID   No facility-administered encounter medications on file as of 07/24/2019.     No Known Allergies   ROS:  The patient denies anorexia, fever, weight changes, headaches,  decreased hearing, ear pain, sore throat, breast concerns, chest pain, palpitations, dizziness, syncope, dyspnea on exertion, cough, swelling, nausea, vomiting, diarrhea, constipation, abdominal pain, melena, hematochezia, hematuria, incontinence, dysuria, vaginal bleeding, discharge, odor or itch, genital lesions, joint pains, numbness, tingling, weakness, tremor, suspicious skin lesions, depression, abnormal bleeding/bruising, or enlarged lymph nodes.  Chest pain in lower chest 2 weeks ago, things reflux; no further problems, denies recent heartburn.  Blurred vision, due for eye exam (wears glasses). Anxiety is controlled.  Doesn't sleep well, whole life, unchanged. Sees dermatologist regularly (h/o melanoma).    PHYSICAL EXAM:  BP 130/74   Pulse 80   Temp 97.8 F (36.6 C)   Ht 5\' 6"  (1.676 m)   Wt 180 lb 12.8 oz (82 kg)   BMI 29.18 kg/m   Wt Readings from Last 3 Encounters:  07/24/19 180 lb 12.8 oz (82 kg)  03/13/19 178 lb 12.8 oz (81.1 kg)  12/04/18 189 lb 6.4 oz (85.9 kg)    BP 130/74   Pulse 80   Temp 97.8 F (36.6 C)   Ht 5\' 6"  (1.676 m)   Wt 180 lb 12.8 oz (82 kg)   BMI 29.18 kg/m   General Appearance:    Alert, cooperative, no distress, appears stated age  Head:    Normocephalic, without obvious abnormality, atraumatic  Eyes:    PERRL, conjunctiva/corneas clear, EOM's intact, fundi not well visualized  Ears:    Normal TM's and external ear canals  Nose:   Not visualized, wearing mask due to COVID-19 pandemic  Throat:   Not visualized, wearing mask due to COVID-19 pandemic  Neck:   Supple, no lymphadenopathy;  thyroid:  no enlargement/ tenderness/nodules; no carotid bruit or JVD  Back:    Spine nontender, no curvature, ROM  normal, no CVA     tenderness  Lungs:     Clear to auscultation bilaterally without wheezes, rales or     ronchi; respirations unlabored  Chest Wall:    No tenderness or deformity   Heart:    Regular rate and rhythm, S1 and S2 normal, no murmur, rub   or gallop  Breast Exam:    No tenderness, masses, or nipple discharge or inversion.      No axillary lymphadenopathy  Abdomen:     Soft, non-tender, nondistended, normoactive bowel sounds,    no masses, no hepatosplenomegaly  Genitalia:    Normal external genitalia without lesions.  BUS and vagina normal. No abnormal vaginal discharge.  Uterus is surgically absent; adnexa not enlarged, nontender, no masses.   Rectal:    Normal tone, no masses or tenderness; guaiac negative stool  Extremities:   No clubbing, cyanosis or edema  Pulses:   2+ and symmetric all extremities  Skin:   Skin color, texture, turgor normal, no rashes or lesions  Lymph nodes:   Cervical, supraclavicular, and axillary nodes normal  Neurologic:   CNII-XII intact, normal strength, sensation and gait; reflexes 2+ and symmetric throughout          Psych:   Normal mood, affect, hygiene and grooming.   GAD score of 4 ASCVD risk  5.5% (borderline, improved from 6.6% on last check).   Fasting glucose 97 Lab Results  Component Value Date   HGBA1C 5.5 07/21/2019   Lab Results  Component Value Date   CHOL 216 (H) 07/21/2019   HDL 63 07/21/2019   LDLCALC 142 (H) 07/21/2019   TRIG 60 07/21/2019   CHOLHDL 3.4 07/21/2019    ASSESSMENT/PLAN:  Annual physical exam  Essential (primary) hypertension - controlled  Pure hypercholesterolemia - borderline, improved. Cont low cholesterol diet  Anxiety disorder, unspecified type - controlled  Impaired fasting glucose - improved  Need for influenza vaccination - Plan: Flu Vaccine QUAD 6+ mos PF IM (Fluarix Quad PF)  Osteopenia, unspecified location - past due for f/u DEXA, ordered. Discussed Ca, D, and weight-bearing exercise -  Plan: DG Bone  Density  Postmenopausal estrogen deficiency - Plan: DG Bone Density  Family history of osteoporosis in mother - Plan: DG Bone Density   DEXA and mammo due  F/u 6 months with labs prior   Discussed monthly self breast exams and yearly mammograms; at least 30 minutes of aerobic activity at least 5 days/week and weight-bearing exercise at least 2-3 days/week; proper sunscreen use reviewed; healthy diet, including goals of calcium and vitamin D intake and alcohol recommendations (less than or equal to 1 drink/day) reviewed; regular seatbelt use; changing batteries in smoke detectors.  Immunization recommendations discussed--flu shot today.  Colonoscopy recommendations reviewed, UTD.

## 2019-07-24 ENCOUNTER — Other Ambulatory Visit: Payer: Self-pay

## 2019-07-24 ENCOUNTER — Encounter: Payer: Self-pay | Admitting: Family Medicine

## 2019-07-24 ENCOUNTER — Ambulatory Visit (INDEPENDENT_AMBULATORY_CARE_PROVIDER_SITE_OTHER): Admitting: Family Medicine

## 2019-07-24 VITALS — BP 130/74 | HR 80 | Temp 97.8°F | Ht 66.0 in | Wt 180.8 lb

## 2019-07-24 DIAGNOSIS — M8580 Other specified disorders of bone density and structure, unspecified site: Secondary | ICD-10-CM

## 2019-07-24 DIAGNOSIS — E7800 Pure hypercholesterolemia, unspecified: Secondary | ICD-10-CM | POA: Diagnosis not present

## 2019-07-24 DIAGNOSIS — Z780 Asymptomatic menopausal state: Secondary | ICD-10-CM

## 2019-07-24 DIAGNOSIS — Z0000 Encounter for general adult medical examination without abnormal findings: Secondary | ICD-10-CM

## 2019-07-24 DIAGNOSIS — I10 Essential (primary) hypertension: Secondary | ICD-10-CM | POA: Diagnosis not present

## 2019-07-24 DIAGNOSIS — Z23 Encounter for immunization: Secondary | ICD-10-CM | POA: Diagnosis not present

## 2019-07-24 DIAGNOSIS — R7301 Impaired fasting glucose: Secondary | ICD-10-CM

## 2019-07-24 DIAGNOSIS — F419 Anxiety disorder, unspecified: Secondary | ICD-10-CM

## 2019-07-24 DIAGNOSIS — Z8262 Family history of osteoporosis: Secondary | ICD-10-CM

## 2019-08-13 ENCOUNTER — Other Ambulatory Visit: Payer: Self-pay | Admitting: *Deleted

## 2019-08-13 DIAGNOSIS — F419 Anxiety disorder, unspecified: Secondary | ICD-10-CM

## 2019-08-13 DIAGNOSIS — I10 Essential (primary) hypertension: Secondary | ICD-10-CM

## 2019-08-13 MED ORDER — CITALOPRAM HYDROBROMIDE 20 MG PO TABS: 20.0000 mg | ORAL_TABLET | Freq: Every day | ORAL | 1 refills | Status: DC

## 2019-08-13 MED ORDER — CITALOPRAM HYDROBROMIDE 20 MG PO TABS: 20.0000 mg | ORAL_TABLET | Freq: Every day | ORAL | 0 refills | Status: DC

## 2019-08-13 MED ORDER — AMLODIPINE BESYLATE 10 MG PO TABS: ORAL_TABLET | ORAL | 1 refills | Status: DC

## 2019-08-13 MED ORDER — LOSARTAN POTASSIUM-HCTZ 100-25 MG PO TABS: 1.0000 | ORAL_TABLET | Freq: Every day | ORAL | 1 refills | Status: DC

## 2019-08-18 ENCOUNTER — Telehealth: Payer: Self-pay | Admitting: Family Medicine

## 2019-08-18 DIAGNOSIS — I10 Essential (primary) hypertension: Secondary | ICD-10-CM

## 2019-08-18 MED ORDER — LOSARTAN POTASSIUM-HCTZ 100-25 MG PO TABS: 1.0000 | ORAL_TABLET | Freq: Every day | ORAL | 1 refills | Status: DC

## 2019-08-18 NOTE — Telephone Encounter (Signed)
Done

## 2019-08-18 NOTE — Telephone Encounter (Signed)
Pt called and states that at [facility] Losartan HCTZ is on back order. She is requesting refill be sent to a local pharmacy. Please send to [facility] at [facility]. PT can be reached at [phone].

## 2019-09-01 ENCOUNTER — Other Ambulatory Visit: Payer: Self-pay | Admitting: Family Medicine

## 2019-09-01 DIAGNOSIS — Z1231 Encounter for screening mammogram for malignant neoplasm of breast: Secondary | ICD-10-CM

## 2019-11-24 ENCOUNTER — Encounter: Payer: Self-pay | Admitting: Family Medicine

## 2019-11-24 ENCOUNTER — Ambulatory Visit
Admission: RE | Admit: 2019-11-24 | Discharge: 2019-11-24 | Disposition: A | Discharge status: Home / Self Care | Source: Ambulatory Visit | Attending: Family Medicine | Admitting: Family Medicine

## 2019-11-24 ENCOUNTER — Ambulatory Visit
Admission: RE | Admit: 2019-11-24 | Discharge: 2019-11-24 | Disposition: A | Discharge status: Home / Self Care | Payer: 59 | Source: Ambulatory Visit | Attending: Family Medicine | Admitting: Family Medicine

## 2019-11-24 ENCOUNTER — Other Ambulatory Visit: Payer: Self-pay

## 2019-11-24 DIAGNOSIS — Z1231 Encounter for screening mammogram for malignant neoplasm of breast: Secondary | ICD-10-CM

## 2019-11-24 DIAGNOSIS — Z8262 Family history of osteoporosis: Secondary | ICD-10-CM

## 2019-11-24 DIAGNOSIS — M8580 Other specified disorders of bone density and structure, unspecified site: Secondary | ICD-10-CM

## 2019-11-24 DIAGNOSIS — Z780 Asymptomatic menopausal state: Secondary | ICD-10-CM

## 2019-11-27 ENCOUNTER — Encounter: Payer: Self-pay | Admitting: Family Medicine

## 2019-12-17 ENCOUNTER — Ambulatory Visit: Payer: Self-pay

## 2019-12-21 ENCOUNTER — Ambulatory Visit: Attending: Internal Medicine

## 2019-12-21 DIAGNOSIS — Z23 Encounter for immunization: Secondary | ICD-10-CM | POA: Insufficient documentation

## 2019-12-21 NOTE — Progress Notes (Signed)
   Covid-19 Vaccination Clinic  Name:  Carol Thomas    MRN: [id number] DOB: 1955/12/16  12/21/2019  Carol Thomas was observed post Covid-19 immunization for 15 minutes without incident. She was provided with Vaccine Information Sheet and instruction to access the V-Safe system.   Carol Thomas was instructed to call 911 with any severe reactions post vaccine: Marland Kitchen Difficulty breathing  . Swelling of face and throat  . A fast heartbeat  . A bad rash all over body  . Dizziness and weakness   Immunizations Administered    Name Date Dose VIS Date Route   Pfizer COVID-19 Vaccine 12/21/2019  9:19 AM 0.3 mL 09/26/2019 Intramuscular   Manufacturer: [place]   Lot: [id number]   [place]: [id number]

## 2020-01-01 ENCOUNTER — Other Ambulatory Visit: Payer: Self-pay | Admitting: Internal Medicine

## 2020-01-15 ENCOUNTER — Other Ambulatory Visit

## 2020-01-19 ENCOUNTER — Other Ambulatory Visit

## 2020-01-19 ENCOUNTER — Other Ambulatory Visit: Payer: Self-pay

## 2020-01-19 DIAGNOSIS — R7301 Impaired fasting glucose: Secondary | ICD-10-CM

## 2020-01-19 DIAGNOSIS — I10 Essential (primary) hypertension: Secondary | ICD-10-CM

## 2020-01-19 DIAGNOSIS — E7800 Pure hypercholesterolemia, unspecified: Secondary | ICD-10-CM

## 2020-01-19 LAB — LIPID PANEL
Chol/HDL Ratio: 4.1 ratio (ref 0.0–4.4)
Cholesterol, Total: 215 mg/dL — ABNORMAL HIGH (ref 100–199)
HDL: 52 mg/dL (ref >39)
LDL Chol Calc (NIH): 146 mg/dL — ABNORMAL HIGH (ref 0–99)
Triglycerides: 94 mg/dL (ref 0–149)
VLDL Cholesterol Cal: 17 mg/dL (ref 5–40)

## 2020-01-19 LAB — COMPREHENSIVE METABOLIC PANEL
ALT: 9 IU/L (ref 0–32)
AST: 15 IU/L (ref 0–40)
Albumin/Globulin Ratio: 1.8 (ref 1.2–2.2)
Albumin: 4.2 g/dL (ref 3.8–4.8)
Alkaline Phosphatase: 93 IU/L (ref 39–117)
BUN/Creatinine Ratio: 13 (ref 12–28)
BUN: 14 mg/dL (ref 8–27)
Bilirubin Total: 0.7 mg/dL (ref 0.0–1.2)
CO2: 23 mmol/L (ref 20–29)
Calcium: 9.8 mg/dL (ref 8.7–10.3)
Chloride: 102 mmol/L (ref 96–106)
Creatinine, Ser: 1.05 mg/dL — ABNORMAL HIGH (ref 0.57–1.00)
GFR calc Af Amer: 66 mL/min/{1.73_m2} (ref >59)
GFR calc non Af Amer: 57 mL/min/{1.73_m2} — ABNORMAL LOW (ref >59)
Globulin, Total: 2.4 g/dL (ref 1.5–4.5)
Glucose: 95 mg/dL (ref 65–99)
Potassium: 4.4 mmol/L (ref 3.5–5.2)
Sodium: 141 mmol/L (ref 134–144)
Total Protein: 6.6 g/dL (ref 6.0–8.5)

## 2020-01-20 ENCOUNTER — Encounter: Payer: Self-pay | Admitting: Family Medicine

## 2020-01-21 ENCOUNTER — Ambulatory Visit: Attending: Internal Medicine

## 2020-01-21 DIAGNOSIS — Z23 Encounter for immunization: Secondary | ICD-10-CM

## 2020-01-21 NOTE — Progress Notes (Signed)
   Covid-19 Vaccination Clinic  Name:  Carol Thomas    MRN: [id number] DOB: [birth year]  01/21/2020  Carol Thomas was observed post Covid-19 immunization for 15 minutes without incident. She was provided with Vaccine Information Sheet and instruction to access the V-Safe system.   Carol Thomas was instructed to call 911 with any severe reactions post vaccine: Marland Kitchen Difficulty breathing  . Swelling of face and throat  . A fast heartbeat  . A bad rash all over body  . Dizziness and weakness   Immunizations Administered    Name Date Dose VIS Date Route   Pfizer COVID-19 Vaccine 01/21/2020 10:46 AM 0.3 mL 09/26/2019 Intramuscular   Manufacturer: [employer]   Lot: [id number]   [place]: [id number]

## 2020-01-21 NOTE — Progress Notes (Signed)
Chief Complaint  Patient presents with  . Hypertension    nonfasting med check, labs already done. No new concerns.     Patient presents for 6 month follow-up. She had labs done prior to visit, see below. She had 2nd [place] vaccine yesterday. Denies side effects.  Hypertension. She continues on amlodipine 110m and losartan-HCT 100-25. BP's have been runningupper 120's to lower 130's/70's Denies headaches, dizziness, chest pain, palpitations, shortness of breath, edema. Denies exertional chest pain or dyspnea.  She continues to work out with pPhysiological scientist increased to 3x/week (her goal was to get down to 165# before today's visit), 45-60 mins. Weights, cardio. Also works in her yard.  Anxiety:She is doing well on citalopram 226mShe is no longer working,iscaring for her mother-in-law in her home (who had a stroke, with deficits in speech, balance, and has some dementia).  Husband has been traveling during the week over the last year, coming home on weekends. Mother-in-law with dementia fell and hit her head.  She is okay, dementia is worsening. Other current stressors include: 4 year anniversary of father's suicide. Daughter had hysterectomy this week. Husband needs pacemaker changed ("leadless" PM).  Working out helps with her moods, as does talking to her cousin (able to vent). Denies trouble relaxing or sleeping.  Hyperlipidemia--She tries to follow lowfat, low cholesterol diet. She increased to eating filet 2x/week when she started working out more frequently. She doesn't eat eggs (causes reflux), tries to limit cheese, just on salads (and tacos, small amount). She has never taken lipid-lowering medications, and prefers not to. LDL was 152 in 02/2019 (ASCVD risk calculator 6.6% (borderline); with dietary changes (and less red meat than she is currently eating)  LDL was 142 and ASCVD risk of 5.5% on last check 6 months ago.  See below for labs done prior to  visit.  Impaired fasting glucose:  Sugar was 103 in May 2020, improved to 97 in 07/2019. She tries to limit sugar/sweets/carbs, and gets regular exercise. Lab Results  Component Value Date   HGBA1C 5.5 07/21/2019    PMH, PSH, SH reviewed   Outpatient Encounter Medications as of 01/22/2020  Medication Sig  . amLODipine (NORVASC) 10 MG tablet TAKE 1 TABLET(10 MG) BY MOUTH DAILY  . cholecalciferol (VITAMIN D) 1000 units tablet Take 2,000 Units by mouth daily.  . citalopram (CELEXA) 20 MG tablet Take 1 tablet (20 mg total) by mouth daily.  . Marland Kitchenosartan-hydrochlorothiazide (HYZAAR) 100-25 MG tablet Take 1 tablet by mouth daily.  . [DISCONTINUED] desonide (DESOWEN) 0.05 % ointment APP EXT TO THE SKIN BID   No facility-administered encounter medications on file as of 01/22/2020.   No Known Allergies   ROS: no fever, chills, URI symptoms, headaches (just last night due to vaccines), dizziness, chest pain, palpitations, shortness of breath.  No GI or GU complaints. No bleeding, bruising, rash. Moods are stable. See HPI.   PHYSICAL EXAM:  BP 138/84   Pulse 68   Temp 97.7 F (36.5 C) (Other (Comment))   Ht '5\' 6"'  (1.676 m)   Wt 166 lb 12.8 oz (75.7 kg)   BMI 26.92 kg/m   Wt Readings from Last 3 Encounters:  01/22/20 166 lb 12.8 oz (75.7 kg)  07/24/19 180 lb 12.8 oz (82 kg)  03/13/19 178 lb 12.8 oz (81.1 kg)    Well developed, pleasant female, inno distress HEENT: PERRL, EOMI, conjunctiva and sclera clear. Wearing mask Neck: no lymphadenopathy, thyromegaly or carotid bruit Heart: regular rate and rhythm without murmur Lungs:  clear bilaterally Back: no CVA or spinal tenderness Abdomen: soft, nontender, no organomegaly or mass Extremities: no edema, 2+ pulses Skin: normal turgor, no rashes/lesions Neuro: alert and oriented, normal gait Psych: normal mood, affect,hygiene and grooming, normal eye contact and speech     Chemistry      Component Value Date/Time   NA 141  01/19/2020 0829   K 4.4 01/19/2020 0829   CL 102 01/19/2020 0829   CO2 23 01/19/2020 0829   BUN 14 01/19/2020 0829   CREATININE 1.05 (H) 01/19/2020 0829   CREATININE 1.06 (H) 10/22/2017 0823      Component Value Date/Time   CALCIUM 9.8 01/19/2020 0829   ALKPHOS 93 01/19/2020 0829   AST 15 01/19/2020 0829   ALT 9 01/19/2020 0829   BILITOT 0.7 01/19/2020 0829     Fasting glucose 95  Lab Results  Component Value Date   CHOL 215 (H) 01/19/2020   HDL 52 01/19/2020   LDLCALC 146 (H) 01/19/2020   TRIG 94 01/19/2020   CHOLHDL 4.1 01/19/2020    ASSESSMENT/PLAN:  Essential (primary) hypertension - borderline in office, better at home. Cont low Na diet, regular exercise, current meds - Plan: amLODipine (NORVASC) 10 MG tablet, losartan-hydrochlorothiazide (HYZAAR) 100-25 MG tablet  Pure hypercholesterolemia - declines meds. ASCVD risk borderline. Encouraged continued low cholesterol diet, keeping BP down (to lower risk)  Anxiety disorder - controlled - Plan: citalopram (CELEXA) 20 MG tablet  ASCVD risk  6.8%, borderline. Counseled re: low cholesterol diet, low sodium diet. BP borderline here, better at home. (lower BP lowers ASCVD risk further, plugging in home BP's) Congratulated on weight loss. Moods controlled, and regular exercise and time away from [place] (extra time with trainer on weekends) also helps.  Refill amlodipine, hyzaar, citalopram  F/u as scheduled in October for CPE Labs prior--CBC, c-met, lipid

## 2020-01-22 ENCOUNTER — Encounter: Payer: Self-pay | Admitting: Family Medicine

## 2020-01-22 ENCOUNTER — Other Ambulatory Visit: Payer: Self-pay

## 2020-01-22 ENCOUNTER — Ambulatory Visit (INDEPENDENT_AMBULATORY_CARE_PROVIDER_SITE_OTHER): Admitting: Family Medicine

## 2020-01-22 VITALS — BP 138/84 | HR 68 | Temp 97.7°F | Ht 66.0 in | Wt 166.8 lb

## 2020-01-22 DIAGNOSIS — F419 Anxiety disorder, unspecified: Secondary | ICD-10-CM

## 2020-01-22 DIAGNOSIS — I10 Essential (primary) hypertension: Secondary | ICD-10-CM | POA: Diagnosis not present

## 2020-01-22 DIAGNOSIS — E7800 Pure hypercholesterolemia, unspecified: Secondary | ICD-10-CM | POA: Diagnosis not present

## 2020-01-22 DIAGNOSIS — Z0000 Encounter for general adult medical examination without abnormal findings: Secondary | ICD-10-CM

## 2020-01-22 MED ORDER — AMLODIPINE BESYLATE 10 MG PO TABS: ORAL_TABLET | ORAL | 1 refills | Status: DC

## 2020-01-22 MED ORDER — CITALOPRAM HYDROBROMIDE 20 MG PO TABS: 20.0000 mg | ORAL_TABLET | Freq: Every day | ORAL | 1 refills | Status: DC

## 2020-01-22 MED ORDER — LOSARTAN POTASSIUM-HCTZ 100-25 MG PO TABS: 1.0000 | ORAL_TABLET | Freq: Every day | ORAL | 1 refills | Status: DC

## 2020-01-22 NOTE — Patient Instructions (Signed)
Continue your same medications. Monitor BP at home, as it was a little high in the office today. Continue regular exercise.  Try and follow low cholesterol diet.  There are lots of other protein sources other than red meat.

## 2020-07-16 ENCOUNTER — Telehealth: Payer: Self-pay | Admitting: Family Medicine

## 2020-07-16 DIAGNOSIS — I10 Essential (primary) hypertension: Secondary | ICD-10-CM

## 2020-07-16 NOTE — Telephone Encounter (Signed)
Pt called and said she got a letter from [place] saying that the [facility] has been recalled. She is wondering if she needs to stop taking the medication and get something else prescribed

## 2020-07-16 NOTE — Telephone Encounter (Signed)
Unable to reach pt so I left a VM advising her

## 2020-07-16 NOTE — Telephone Encounter (Signed)
It is likely a recall of a particular lot. She should check with her pharmacy to get a substitute.

## 2020-07-16 NOTE — Telephone Encounter (Signed)
Pt called back and stated that mail order pharmacy was unable to help her with any substitutions. They state that that any order they place will take over a week to receive, and then would have to be sent to her. She is requesting rx be sent to a local pharmacy. Please send to [facility] on [address]. Pt is aware Dr. Tomi Bamberger is out of office.

## 2020-07-18 MED ORDER — LOSARTAN POTASSIUM-HCTZ 100-25 MG PO TABS: 1.0000 | ORAL_TABLET | Freq: Every day | ORAL | 0 refills | Status: DC

## 2020-07-18 NOTE — Telephone Encounter (Signed)
I haven't been notified of any recalls.  But since she was, she should contact them about getting more (unaffected lot).  Message sent to patient about 30d supply being called in.

## 2020-07-19 ENCOUNTER — Telehealth: Payer: Self-pay

## 2020-07-19 NOTE — Telephone Encounter (Signed)
Called pt. LM for call back.  

## 2020-07-19 NOTE — Telephone Encounter (Signed)
See prior messages.  Patient had called about recall.  I sent in 30d supply to local pharmacy.  Advise pt that we got RF request from [facility]--verify that her blood pressures remain good, and if so, we can send in another 90d supply (no refills).  If she isn't sure, she has appt 10/18--I just don't want her to cut it too close to getting more in the mail if we wait until the appointment, if that will be enough time or not. You can ask her what she wants [place] to do (wait vs send in 90 now).

## 2020-07-19 NOTE — Telephone Encounter (Signed)
Received fax from [facility] for a refill on the pts. Losartan/hctz pt. Last apt was 01/22/20 and next apt is 08/02/20.

## 2020-07-21 NOTE — Telephone Encounter (Signed)
Pt. Called back stating that her BP has been running good it was 127/77 this a.m. She also said that she is fine to wait until her next apt. On 08/02/20 for that refill.

## 2020-07-28 ENCOUNTER — Other Ambulatory Visit: Payer: Self-pay

## 2020-07-28 ENCOUNTER — Other Ambulatory Visit

## 2020-07-28 DIAGNOSIS — E7800 Pure hypercholesterolemia, unspecified: Secondary | ICD-10-CM

## 2020-07-28 DIAGNOSIS — I10 Essential (primary) hypertension: Secondary | ICD-10-CM

## 2020-07-28 DIAGNOSIS — Z0000 Encounter for general adult medical examination without abnormal findings: Secondary | ICD-10-CM

## 2020-07-29 LAB — CBC WITH DIFFERENTIAL/PLATELET
Basophils Absolute: 0 10*3/uL (ref 0.0–0.2)
Basos: 1 %
EOS (ABSOLUTE): 0.2 10*3/uL (ref 0.0–0.4)
Eos: 5 %
Hematocrit: 40.8 % (ref 34.0–46.6)
Hemoglobin: 13.6 g/dL (ref 11.1–15.9)
Immature Grans (Abs): 0 10*3/uL (ref 0.0–0.1)
Immature Granulocytes: 0 %
Lymphocytes Absolute: 1.1 10*3/uL (ref 0.7–3.1)
Lymphs: 30 %
MCH: 29.4 pg (ref 26.6–33.0)
MCHC: 33.3 g/dL (ref 31.5–35.7)
MCV: 88 fL (ref 79–97)
Monocytes Absolute: 0.3 10*3/uL (ref 0.1–0.9)
Monocytes: 8 %
Neutrophils Absolute: 2 10*3/uL (ref 1.4–7.0)
Neutrophils: 56 %
Platelets: 135 10*3/uL — ABNORMAL LOW (ref 150–450)
RBC: 4.62 x10E6/uL (ref 3.77–5.28)
RDW: 12.9 % (ref 11.7–15.4)
WBC: 3.6 10*3/uL (ref 3.4–10.8)

## 2020-07-29 LAB — COMPREHENSIVE METABOLIC PANEL
ALT: 19 IU/L (ref 0–32)
AST: 23 IU/L (ref 0–40)
Albumin/Globulin Ratio: 1.8 (ref 1.2–2.2)
Albumin: 4.3 g/dL (ref 3.8–4.8)
Alkaline Phosphatase: 102 IU/L (ref 44–121)
BUN/Creatinine Ratio: 11 — ABNORMAL LOW (ref 12–28)
BUN: 11 mg/dL (ref 8–27)
Bilirubin Total: 0.6 mg/dL (ref 0.0–1.2)
CO2: 26 mmol/L (ref 20–29)
Calcium: 10.1 mg/dL (ref 8.7–10.3)
Chloride: 101 mmol/L (ref 96–106)
Creatinine, Ser: 0.97 mg/dL (ref 0.57–1.00)
GFR calc Af Amer: 72 mL/min/{1.73_m2} (ref >59)
GFR calc non Af Amer: 62 mL/min/{1.73_m2} (ref >59)
Globulin, Total: 2.4 g/dL (ref 1.5–4.5)
Glucose: 88 mg/dL (ref 65–99)
Potassium: 4.8 mmol/L (ref 3.5–5.2)
Sodium: 139 mmol/L (ref 134–144)
Total Protein: 6.7 g/dL (ref 6.0–8.5)

## 2020-07-29 LAB — LIPID PANEL
Chol/HDL Ratio: 3.9 ratio (ref 0.0–4.4)
Cholesterol, Total: 251 mg/dL — ABNORMAL HIGH (ref 100–199)
HDL: 64 mg/dL (ref >39)
LDL Chol Calc (NIH): 172 mg/dL — ABNORMAL HIGH (ref 0–99)
Triglycerides: 86 mg/dL (ref 0–149)
VLDL Cholesterol Cal: 15 mg/dL (ref 5–40)

## 2020-08-01 NOTE — Progress Notes (Signed)
Chief Complaint  Patient presents with  . Annual Exam    nonfasting annual exam with pelvic. Sees eye doctor for eye exams. Will give urine on way out, if she can. No new concerns.    Carol Thomas is a 64 y.o. female who presents for a complete physical and follow-up on chronic problems.    Hypertension. She continues on amlodipine 10mg  and losartan-HCT 100-25.  BP's have been running112-134/67-81, pulse 56-70. Denies headaches, dizziness, chest pain, palpitations, shortness of breath, edema. Denies exertional chest pain or dyspnea.  Shecontinues to workout with personal trainer 3x/week, 45-60 mins. Weights, cardio. Also works in her yard.  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  Working out helps with her moods, as does talking to her cousin (able to vent). Denies trouble relaxing or sleeping. She enjoyed a wonderful weekend at [employer] with her husband last weekend for their anniversary.  Hyperlipidemia--She tries to follow lowfat, low cholesterol diet. She continues to eat steak 2x/week.  She doesn't eat eggs (causes reflux), tries to limit cheese, just on salads (and tacos, small amount). She has never taken lipid-lowering medications, and prefers not to.  The weekend prior to her recent labs she ate some steak.  She has been eating more sausage, for breakfast, on a daily basis since her last lipids were checked.  01/2020 LDL was 146, ASCVD risk  6.8%, borderline. See below for labs done prior to visit  (LDL higher).  H/o Impaired fasting glucose: Sugar was 103 in May 2020, improved to 97 in 07/2019, remained <100 in 01/2020. She tries to limit sugar/sweets/carbs, and gets regular exercise.   Immunization History  Administered Date(s) Administered  . Hepatitis A 08/16/2006  . Hepatitis A, Adult  10/02/2006, 04/12/2016  . Influenza Inj Mdck Quad Pf 08/01/2017  . Influenza,inj,Quad PF,6+ Mos 10/10/2012, 08/19/2014, 09/08/2015, 10/18/2016, 08/08/2018, 07/24/2019  . Influenza-Unspecified 08/16/2017  . PFIZER SARS-COV-2 Vaccination 12/21/2019, 01/21/2020  . Tdap 10/02/2006, 04/12/2016  . Zoster Recombinat (Shingrix) 05/06/2018, 08/08/2018   Last Pap smear: 02/2014 Dr. Phineas Real; s/p hysterectomy in 1997 Last mammogram: 11/2019 Last colonoscopy: 06/2015 Dr. Penelope Coop.  Hemorrhoids, diverticulosis.  1 polyp (benign mucosa) Last DEXA: 11/2019 T-2.4 L fem neck, normal FRAX Dentist: twice yearly Ophtho: yearly Exercise: She has been working out with Physiological scientist 3x/week, 45-60 mins. Weights, cardio. Also works in her yard.   PMH, PSH, SH, and FH were reviewed and updated  Outpatient Encounter Medications as of 08/02/2020  Medication Sig  . amLODipine (NORVASC) 10 MG tablet TAKE 1 TABLET(10 MG) BY MOUTH DAILY  . Calcium-Magnesium-Zinc (CA-MG-ZN PO) Take 1 tablet by mouth daily.  . cholecalciferol (VITAMIN D) 1000 units tablet Take 2,000 Units by mouth daily.  . citalopram (CELEXA) 20 MG tablet Take 1 tablet (20 mg total) by mouth daily.  Marland Kitchen losartan-hydrochlorothiazide (HYZAAR) 100-25 MG tablet Take 1 tablet by mouth daily.   No facility-administered encounter medications on file as of 08/02/2020.   No Known Allergies  ROS:  The patient denies anorexia, fever, weight changes, headaches, decreased hearing, ear pain, sore throat, breast concerns, chest pain, palpitations, dizziness, syncope, dyspnea on exertion, cough, swelling, nausea, vomiting, diarrhea, constipation, abdominal pain, melena, hematochezia, hematuria, incontinence, dysuria, vaginal bleeding, discharge, odor or itch, genital lesions, joint pains, numbness, tingling, weakness, tremor, suspicious skin lesions, depression, abnormal bleeding/bruising, or enlarged lymph nodes. Some pain  in her L thumb and 3rd finger. Anxiety is  controlled.  Doesn't sleep well, whole life, unchanged. Sees dermatologist regularly (h/o melanoma). 25# weight loss in the last year, intentional.   PHYSICAL EXAM:  BP 138/82   Pulse 72   Ht 5' 5.5" (1.664 m)   Wt 155 lb 6.4 oz (70.5 kg)   BMI 25.47 kg/m   124/80 on repeat by MD  Wt Readings from Last 3 Encounters:  08/02/20 155 lb 6.4 oz (70.5 kg)  01/22/20 166 lb 12.8 oz (75.7 kg)  07/24/19 180 lb 12.8 oz (82 kg)    General Appearance:    Alert, cooperative, no distress, appears stated age  Head:    Normocephalic, without obvious abnormality, atraumatic  Eyes:    PERRL, conjunctiva/corneas clear, EOM's intact, fundi not well visualized  Ears:    Normal TM's and external ear canals  Nose:   Not visualized, wearing mask due to COVID-19 pandemic  Throat:   Not visualized, wearing mask due to COVID-19 pandemic  Neck:   Supple, no lymphadenopathy;  thyroid:  no enlargement/ tenderness/nodules; no carotid bruit or JVD  Back:    Spine nontender, no curvature, ROM normal, no CVA  tenderness  Lungs:     Clear to auscultation bilaterally without wheezes, rales or    ronchi; respirations unlabored  Chest Wall:    No tenderness or deformity   Heart:    Regular rate and rhythm, S1 and S2 normal, no murmur, rub   or gallop  Breast Exam:    No tenderness, masses, or nipple discharge or inversion.   No axillary lymphadenopathy  Abdomen:     Soft, non-tender, nondistended, normoactive bowel sounds,    no masses, no hepatosplenomegaly  Genitalia:    Normal external genitalia without lesions.  BUS and vagina normal. No abnormal vaginal discharge.  Uterus is surgically absent; adnexa not enlarged, nontender, no masses.   Rectal:    Normal tone, no masses or tenderness; guaiac negative stool  Extremities:   No clubbing, cyanosis or edema  Pulses:   2+ and symmetric all extremities  Skin:   Skin color, texture, turgor normal, no rashes or lesions  Lymph nodes:   Cervical, supraclavicular, and  axillary nodes normal  Neurologic:   CNII-XII intact, normal strength, sensation and gait; reflexes 2+ and symmetric throughout                                Psych:   Normal mood, affect, hygiene and grooming.   Lab Results  Component Value Date   CHOL 251 (H) 07/28/2020   HDL 64 07/28/2020   LDLCALC 172 (H) 07/28/2020   TRIG 86 07/28/2020   CHOLHDL 3.9 07/28/2020     Chemistry      Component Value Date/Time   NA 139 07/28/2020 0843   K 4.8 07/28/2020 0843   CL 101 07/28/2020 0843   CO2 26 07/28/2020 0843   BUN 11 07/28/2020 0843   CREATININE 0.97 07/28/2020 0843   CREATININE 1.06 (H) 10/22/2017 0823      Component Value Date/Time   CALCIUM 10.1 07/28/2020 0843   ALKPHOS 102 07/28/2020 0843   AST 23 07/28/2020 0843   ALT 19 07/28/2020 0843   BILITOT 0.6 07/28/2020 0843     Fasting glucose 88  Lab Results  Component Value Date   WBC 3.6 07/28/2020   HGB 13.6 07/28/2020   HCT 40.8 07/28/2020  MCV 88 07/28/2020   PLT 135 (L) 07/28/2020    ASSESSMENT/PLAN:  Annual physical exam  Pure hypercholesterolemia - LDL much higher than prev, due to change in diet (daily sausage intake). Reviewed low cholesterol diet. Recheck lipids 3-4 mos - Plan: Lipid panel  Osteopenia of neck of left femur - discussed Ca, D, weight-bearing exercise; recheck DEXA 11/2021  Need for influenza vaccination - Plan: Flu Vaccine QUAD 6+ mos PF IM (Fluarix Quad PF)  Thrombocytopenia (HCC) - mildly low plt noted on recent labs. Recheck with labs in 3-4 mos - Plan: CBC with Differential/Platelet  Essential (primary) hypertension - Well controlled on current meds.Cont low Na diet, regular exercise, current meds - Plan: amLODipine (NORVASC) 10 MG tablet, losartan-hydrochlorothiazide (HYZAAR) 100-25 MG tablet  Anxiety disorder - controlled, cont citalopram - Plan: citalopram (CELEXA) 20 MG tablet   ASCVD risk 7.4% intermediate (higher than at last visit). Will cut out sausage, and recheck in a  few months. Not interested in meds.  CBC (due to mildly low plt), lipids in 3-4 months   Discussed monthly self breast exams and yearly mammograms; at least 30 minutes of aerobic activity at least 5 days/week and weight-bearing exercise at least 2-3 days/week; proper sunscreen use reviewed; healthy diet, including goals of calcium and vitamin D intake and alcohol recommendations (less than or equal to 1 drink/day) reviewed; regular seatbelt use; changing batteries in smoke detectors.  Immunization recommendations discussed--flu shot given today. Not yet in a group eligible for COVID booster.  Colonoscopy recommendations reviewed, to check with Dr. Penelope Coop when next due (had been getting q5 yrs, but no adenomatous polyps in 2016) Repeat DEXA 11/2021

## 2020-08-01 NOTE — Patient Instructions (Addendum)
HEALTH MAINTENANCE RECOMMENDATIONS:  It is recommended that you get at least 30 minutes of aerobic exercise at least 5 days/week (for weight loss, you may need as much as 60-90 minutes). This can be any activity that gets your heart rate up. This can be divided in 10-15 minute intervals if needed, but try and build up your endurance at least once a week.  Weight bearing exercise is also recommended twice weekly.  Eat a healthy diet with lots of vegetables, fruits and fiber.  "Colorful" foods have a lot of vitamins (ie green vegetables, tomatoes, red peppers, etc).  Limit sweet tea, regular sodas and alcoholic beverages, all of which has a lot of calories and sugar.  Up to 1 alcoholic drink daily may be beneficial for women (unless trying to lose weight, watch sugars).  Drink a lot of water.  Calcium recommendations are 1200-1500 mg daily (1500 mg for postmenopausal women or women without ovaries), and vitamin D 1000 IU daily.  This should be obtained from diet and/or supplements (vitamins), and calcium should not be taken all at once, but in divided doses.  Monthly self breast exams and yearly mammograms for women over the age of 50 is recommended.  Sunscreen of at least SPF 30 should be used on all sun-exposed parts of the skin when outside between the hours of 10 am and 4 pm (not just when at beach or pool, but even with exercise, golf, tennis, and yard work!)  Use a sunscreen that says "broad spectrum" so it covers both UVA and UVB rays, and make sure to reapply every 1-2 hours.  Remember to change the batteries in your smoke detectors when changing your clock times in the spring and fall. Carbon monoxide detectors are recommended for your home.  Use your seat belt every time you are in a car, and please drive safely and not be distracted with cell phones and texting while driving.  Check with Dr. Penelope Coop to see when your next colonoscopy is due (last was 06/2015, no adenomatous polyps were found  then).  Cholesterol was higher than previously.  Please cut back on sausage and limit red meats in your diet. Your platelets were just slightly low. We will recheck both of these in February.   Fat and Cholesterol Restricted Eating Plan Getting too much fat and cholesterol in your diet may cause health problems. Choosing the right foods helps keep your fat and cholesterol at normal levels. This can keep you from getting certain diseases.  Meal planning  At meals, divide your plate into four equal parts: ? Fill one-half of your plate with vegetables and green salads. ? Fill one-fourth of your plate with whole grains. ? Fill one-fourth of your plate with low-fat (lean) protein foods.  Eat fish that is high in omega-3 fats at least two times a week. This includes mackerel, tuna, sardines, and salmon.  Eat foods that are high in fiber, such as whole grains, beans, apples, broccoli, carrots, peas, and barley.  General tips   Work with your doctor to lose weight if you need to.  Avoid: ? Foods with added sugar. ? Fried foods. ? Foods with partially hydrogenated oils.  Limit alcohol intake to no more than 1 drink a day for nonpregnant women and 2 drinks a day for men. One drink equals 12 oz of beer, 5 oz of wine, or 1 oz of hard liquor.  Reading food labels  Check food labels for: ? Trans fats. ? Partially hydrogenated oils. ?  Saturated fat (g) in each serving. ? Cholesterol (mg) in each serving. ? Fiber (g) in each serving.  Choose foods with healthy fats, such as: ? Monounsaturated fats. ? Polyunsaturated fats. ? Omega-3 fats.  Choose grain products that have whole grains. Look for the word "whole" as the first word in the ingredient list. Cooking  Cook foods using low-fat methods. These include baking, boiling, grilling, and broiling.  Eat more home-cooked foods. Eat at restaurants and buffets less often.  Avoid cooking using saturated fats, such as butter, cream,  palm oil, palm kernel oil, and coconut oil.  Recommended foods  Fruits  All fresh, canned (in natural juice), or frozen fruits. Vegetables  Fresh or frozen vegetables (raw, steamed, roasted, or grilled). Green salads. Grains  Whole grains, such as whole wheat or whole grain breads, crackers, cereals, and pasta. Unsweetened oatmeal, bulgur, barley, quinoa, or brown rice. Corn or whole wheat flour tortillas. Meats and other protein foods  Ground beef (85% or leaner), grass-fed beef, or beef trimmed of fat. Skinless chicken or [place]. Ground chicken or [place]. Pork trimmed of fat. All fish and seafood. Egg whites. Dried beans, peas, or lentils. Unsalted nuts or seeds. Unsalted canned beans. Nut butters without added sugar or oil. Dairy  Low-fat or nonfat dairy products, such as skim or 1% milk, 2% or reduced-fat cheeses, low-fat and fat-free ricotta or cottage cheese, or plain low-fat and nonfat yogurt. Fats and oils  Tub margarine without trans fats. Light or reduced-fat mayonnaise and salad dressings. Avocado. Olive, canola, sesame, or safflower oils. The items listed above may not be a complete list of foods and beverages you can eat. Contact a dietitian for more information.  Foods to avoid Fruits  Canned fruit in heavy syrup. Fruit in cream or butter sauce. Fried fruit. Vegetables  Vegetables cooked in cheese, cream, or butter sauce. Fried vegetables. Grains  White bread. White pasta. White rice. Cornbread. Bagels, pastries, and croissants. Crackers and snack foods that contain trans fat and hydrogenated oils. Meats and other protein foods  Fatty cuts of meat. Ribs, chicken wings, bacon, sausage, bologna, salami, chitterlings, fatback, hot dogs, bratwurst, and packaged lunch meats. Liver and organ meats. Whole eggs and egg yolks. Chicken and [place] with skin. Fried meat. Dairy  Whole or 2% milk, cream, half-and-half, and cream cheese. Whole milk cheeses. Whole-fat or  sweetened yogurt. Full-fat cheeses. Nondairy creamers and whipped toppings. Processed cheese, cheese spreads, and cheese curds. Beverages  Alcohol. Sugar-sweetened drinks such as sodas, lemonade, and fruit drinks. Fats and oils  Butter, stick margarine, lard, shortening, ghee, or bacon fat. Coconut, palm kernel, and palm oils. Sweets and desserts  Corn syrup, sugars, honey, and molasses. Candy. Jam and jelly. Syrup. Sweetened cereals. Cookies, pies, cakes, donuts, muffins, and ice cream. The items listed above may not be a complete list of foods and beverages you should avoid. Contact a dietitian for more information.  Summary  Choosing the right foods helps keep your fat and cholesterol at normal levels. This can keep you from getting certain diseases.  At meals, fill one-half of your plate with vegetables and green salads.  Eat high-fiber foods, like whole grains, beans, apples, carrots, peas, and barley.  Limit added sugar, saturated fats, alcohol, and fried foods. This information is not intended to replace advice given to you by your health care provider. Make sure you discuss any questions you have with your health care provider. Document Revised: 06/05/2018 Document Reviewed: 06/19/2017 Elsevier Patient Education  2020 [employer]  Inc.

## 2020-08-02 ENCOUNTER — Other Ambulatory Visit: Payer: Self-pay

## 2020-08-02 ENCOUNTER — Ambulatory Visit (INDEPENDENT_AMBULATORY_CARE_PROVIDER_SITE_OTHER): Admitting: Family Medicine

## 2020-08-02 ENCOUNTER — Encounter: Payer: Self-pay | Admitting: Family Medicine

## 2020-08-02 VITALS — BP 124/80 | HR 72 | Ht 65.5 in | Wt 155.4 lb

## 2020-08-02 DIAGNOSIS — M85852 Other specified disorders of bone density and structure, left thigh: Secondary | ICD-10-CM

## 2020-08-02 DIAGNOSIS — F419 Anxiety disorder, unspecified: Secondary | ICD-10-CM

## 2020-08-02 DIAGNOSIS — E7800 Pure hypercholesterolemia, unspecified: Secondary | ICD-10-CM

## 2020-08-02 DIAGNOSIS — Z23 Encounter for immunization: Secondary | ICD-10-CM | POA: Diagnosis not present

## 2020-08-02 DIAGNOSIS — I10 Essential (primary) hypertension: Secondary | ICD-10-CM | POA: Diagnosis not present

## 2020-08-02 DIAGNOSIS — Z0000 Encounter for general adult medical examination without abnormal findings: Secondary | ICD-10-CM | POA: Diagnosis not present

## 2020-08-02 DIAGNOSIS — D696 Thrombocytopenia, unspecified: Secondary | ICD-10-CM

## 2020-08-02 MED ORDER — CITALOPRAM HYDROBROMIDE 20 MG PO TABS: 20.0000 mg | ORAL_TABLET | Freq: Every day | ORAL | 3 refills | Status: DC

## 2020-08-02 MED ORDER — AMLODIPINE BESYLATE 10 MG PO TABS: ORAL_TABLET | ORAL | 1 refills | Status: DC

## 2020-08-02 MED ORDER — LOSARTAN POTASSIUM-HCTZ 100-25 MG PO TABS: 1.0000 | ORAL_TABLET | Freq: Every day | ORAL | 1 refills | Status: DC

## 2020-11-29 ENCOUNTER — Other Ambulatory Visit

## 2020-11-29 ENCOUNTER — Other Ambulatory Visit: Payer: Self-pay

## 2020-11-29 DIAGNOSIS — E7800 Pure hypercholesterolemia, unspecified: Secondary | ICD-10-CM

## 2020-11-29 DIAGNOSIS — D696 Thrombocytopenia, unspecified: Secondary | ICD-10-CM

## 2020-11-30 LAB — CBC WITH DIFFERENTIAL/PLATELET
Basophils Absolute: 0 10*3/uL (ref 0.0–0.2)
Basos: 1 %
EOS (ABSOLUTE): 0.1 10*3/uL (ref 0.0–0.4)
Eos: 3 %
Hematocrit: 40.1 % (ref 34.0–46.6)
Hemoglobin: 13.6 g/dL (ref 11.1–15.9)
Immature Grans (Abs): 0 10*3/uL (ref 0.0–0.1)
Immature Granulocytes: 0 %
Lymphocytes Absolute: 1 10*3/uL (ref 0.7–3.1)
Lymphs: 31 %
MCH: 29.6 pg (ref 26.6–33.0)
MCHC: 33.9 g/dL (ref 31.5–35.7)
MCV: 87 fL (ref 79–97)
Monocytes Absolute: 0.2 10*3/uL (ref 0.1–0.9)
Monocytes: 7 %
Neutrophils Absolute: 1.8 10*3/uL (ref 1.4–7.0)
Neutrophils: 58 %
Platelets: 140 10*3/uL — ABNORMAL LOW (ref 150–450)
RBC: 4.59 x10E6/uL (ref 3.77–5.28)
RDW: 13.1 % (ref 11.7–15.4)
WBC: 3.1 10*3/uL — ABNORMAL LOW (ref 3.4–10.8)

## 2020-11-30 LAB — LIPID PANEL
Chol/HDL Ratio: 3.5 ratio (ref 0.0–4.4)
Cholesterol, Total: 245 mg/dL — ABNORMAL HIGH (ref 100–199)
HDL: 71 mg/dL (ref >39)
LDL Chol Calc (NIH): 163 mg/dL — ABNORMAL HIGH (ref 0–99)
Triglycerides: 66 mg/dL (ref 0–149)
VLDL Cholesterol Cal: 11 mg/dL (ref 5–40)

## 2021-01-06 ENCOUNTER — Other Ambulatory Visit: Payer: Self-pay | Admitting: Family Medicine

## 2021-01-06 ENCOUNTER — Telehealth: Payer: Self-pay | Admitting: Family Medicine

## 2021-01-06 DIAGNOSIS — Z1231 Encounter for screening mammogram for malignant neoplasm of breast: Secondary | ICD-10-CM

## 2021-01-06 DIAGNOSIS — I10 Essential (primary) hypertension: Secondary | ICD-10-CM

## 2021-01-06 DIAGNOSIS — E7800 Pure hypercholesterolemia, unspecified: Secondary | ICD-10-CM

## 2021-01-06 NOTE — Telephone Encounter (Signed)
Left message for patient to see if she needed refill prior to appt.

## 2021-01-06 NOTE — Telephone Encounter (Signed)
Pt called and wanted to see if you still wanted her to have labs done in April before her med check appt or did you want to wait until her CPE in October?

## 2021-01-06 NOTE — Telephone Encounter (Signed)
If she has made changes since she was notified of her results in February, then she can come in prior to her visit and get the cholesterol rechecked (I entered future order).  If she hasn't made any changes yet, then we should give it more time, and we can set up when she should return when she is here in April.  2 months is enough time for a recheck if she made changes to her diet after being notified of them in February.

## 2021-01-07 NOTE — Telephone Encounter (Signed)
Pt advised and said she will wait a little longer just to be safe

## 2021-01-19 ENCOUNTER — Other Ambulatory Visit: Payer: Self-pay | Admitting: Family Medicine

## 2021-01-19 DIAGNOSIS — I10 Essential (primary) hypertension: Secondary | ICD-10-CM

## 2021-01-31 ENCOUNTER — Encounter: Admitting: Family Medicine

## 2021-01-31 NOTE — Progress Notes (Signed)
Chief Complaint  Patient presents with  . Hypertension    Fasting med check.  NONFASTING MED CHECK  Patient presents for 6 month follow-up.  Hypertension. She continues on amlodipine 10mg  and losartan-HCT 100-25.  BP's have been running112-132/80's. (per memory, forgot list at home) Denies headaches, dizziness, chest pain, palpitations, shortness of breath, edema. Denies exertional chest pain or dyspnea.  Shecontinues to workout with personal trainer 3x/week, 45-60 mins. Weights, cardio. Also works in her yard.  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 Working out helps with her moods, as does talking to her cousin (able to vent, she understands what is going on). Denies trouble relaxingor sleeping (sporadic related to husband's schedule and MIL).    Hyperlipidemia--She tries to follow lowfat, low cholesterol diet. She has never taken lipid-lowering medications, and prefers not to. Her cholesterol was higher in October.  She had reported just having had steak, and that she had been eating more sausage, for breakfast, on a daily basis.  Recheck in 11/2020 was only slightly lower, but HDL was higher. She didn't fast today, wants more time to work on her diet. She cut back eating sausage since February, not eating daily, but still eating more than she should.   In 01/2020 LDL was 146, ASCVD risk6.8%,borderline. See below for labs done prior to visit.    Component Ref Range & Units 2 mo ago  (11/29/20) 6 mo ago  (07/28/20) 1 yr ago  (01/19/20) 1 yr ago  (07/21/19) 1 yr ago  (03/11/19) 2 yr ago  (08/06/18) 3 yr ago  (01/30/18)  Cholesterol, Total 100 - 199 mg/dL 245High  251High  215High  216High  225High  214High  219High   Triglycerides 0 - 149 mg/dL 66  86  94  60  84  86  77   HDL >39 mg/dL  71  64  52  63  56  57  58   VLDL Cholesterol Cal 5 - 40 mg/dL 11  15  17  11  17  17  15    LDL Chol Calc (NIH) 0 - 99 mg/dL 163High  172High  146High  142High      Chol/HDL Ratio 0.0 - 4.4 ratio 3.5  3.9 CM  4.1 CM  3.4 CM  4.0 CM  3.8 CM  3.8 CM    H/o Impaired fasting glucose: Sugar was 103 in May2020, improved to 97 in 07/2019, remained <100 in 01/2020. She tries to limit sugar/sweets/carbs, and gets regular exercise.   PMH, PSH, SH reviewed   Outpatient Encounter Medications as of 02/02/2021  Medication Sig  . amLODipine (NORVASC) 10 MG tablet TAKE 1 TABLET(10 MG) BY MOUTH DAILY  . Calcium-Magnesium-Zinc (CA-MG-ZN PO) Take 1 tablet by mouth daily.  . cholecalciferol (VITAMIN D) 1000 units tablet Take 2,000 Units by mouth daily.  . citalopram (CELEXA) 20 MG tablet Take 1 tablet (20 mg total) by mouth daily.  Marland Kitchen losartan-hydrochlorothiazide (HYZAAR) 100-25 MG tablet Take 1 tablet by mouth daily.   No facility-administered encounter medications on file as of 02/02/2021.   No Known Allergies  ROS: no fever, chills, URI symptoms, headaches, dizziness, chest pain, palpitations, shortness of breath.  No GI or GU complaints. No bleeding, bruising, rash. Moods are stable. See HPI.   PHYSICAL EXAM:  BP 140/86   Pulse 72   Ht 5'  5.5" (1.664 m)   Wt 158 lb 9.6 oz (71.9 kg)   BMI 25.99 kg/m   140/78 on repeat by MD  Wt Readings from Last 3 Encounters:  02/02/21 158 lb 9.6 oz (71.9 kg)  08/02/20 155 lb 6.4 oz (70.5 kg)  01/22/20 166 lb 12.8 oz (75.7 kg)    Well developed, pleasant female, inno distress, talkative HEENT: PERRL, EOMI, conjunctiva and sclera clear. Wearing mask Neck: no lymphadenopathy, thyromegaly or carotid bruit Heart: regular rate and rhythm without murmur Lungs: clear bilaterally Back: no CVA or spinal tenderness Abdomen: soft, nontender, no organomegaly or mass Extremities: no edema, 2+ pulses Skin: normal turgor, no rashes/lesions Neuro: alert  and oriented, normal gait Psych: normal mood, affect,hygiene and grooming, normal eye contact and speech    ASSESSMENT/PLAN:  Pure hypercholesterolemia - reviewed low cholesterol diet, her family hx. If ASCVD risk higher, disc poss cardiac CT/calcium score  Essential (primary) hypertension - controlled per home numbers, elevated/borderline in office. Will recheck at home today and send [place] value  Anxiety disorder, unspecified type - well controlled on citalopram  Need for COVID-19 vaccine - Plan: PFIZER Comirnaty(GRAY TOP)COVID-19 Vaccine   If ASCVD borderline/higher on next check suggest CT calcium score. Discussed today.  Recheck lipids in 1-2 mos  F/u in 07/2021 as scheduled for CPE

## 2021-02-02 ENCOUNTER — Encounter: Payer: Self-pay | Admitting: Family Medicine

## 2021-02-02 ENCOUNTER — Ambulatory Visit (INDEPENDENT_AMBULATORY_CARE_PROVIDER_SITE_OTHER): Admitting: Family Medicine

## 2021-02-02 ENCOUNTER — Other Ambulatory Visit: Payer: Self-pay

## 2021-02-02 VITALS — BP 121/71 | HR 56 | Ht 65.5 in | Wt 158.6 lb

## 2021-02-02 DIAGNOSIS — Z23 Encounter for immunization: Secondary | ICD-10-CM

## 2021-02-02 DIAGNOSIS — F419 Anxiety disorder, unspecified: Secondary | ICD-10-CM

## 2021-02-02 DIAGNOSIS — E7800 Pure hypercholesterolemia, unspecified: Secondary | ICD-10-CM

## 2021-02-02 DIAGNOSIS — I10 Essential (primary) hypertension: Secondary | ICD-10-CM | POA: Diagnosis not present

## 2021-03-02 ENCOUNTER — Other Ambulatory Visit: Payer: Self-pay

## 2021-03-02 ENCOUNTER — Ambulatory Visit
Admission: RE | Admit: 2021-03-02 | Discharge: 2021-03-02 | Disposition: A | Discharge status: Home / Self Care | Source: Ambulatory Visit | Attending: Family Medicine | Admitting: Family Medicine

## 2021-03-02 DIAGNOSIS — Z1231 Encounter for screening mammogram for malignant neoplasm of breast: Secondary | ICD-10-CM

## 2021-03-03 ENCOUNTER — Other Ambulatory Visit: Payer: Self-pay | Admitting: Family Medicine

## 2021-03-03 DIAGNOSIS — I10 Essential (primary) hypertension: Secondary | ICD-10-CM

## 2021-03-24 ENCOUNTER — Encounter: Payer: Self-pay | Admitting: Family Medicine

## 2021-04-04 ENCOUNTER — Other Ambulatory Visit

## 2021-04-04 ENCOUNTER — Other Ambulatory Visit: Payer: Self-pay

## 2021-04-04 DIAGNOSIS — E7800 Pure hypercholesterolemia, unspecified: Secondary | ICD-10-CM

## 2021-04-05 ENCOUNTER — Encounter: Payer: Self-pay | Admitting: Family Medicine

## 2021-04-05 LAB — LIPID PANEL
Chol/HDL Ratio: 3.1 ratio (ref 0.0–4.4)
Cholesterol, Total: 223 mg/dL — ABNORMAL HIGH (ref 100–199)
HDL: 72 mg/dL (ref >39)
LDL Chol Calc (NIH): 145 mg/dL — ABNORMAL HIGH (ref 0–99)
Triglycerides: 38 mg/dL (ref 0–149)
VLDL Cholesterol Cal: 6 mg/dL (ref 5–40)

## 2021-07-13 ENCOUNTER — Encounter: Payer: Self-pay | Admitting: *Deleted

## 2021-07-19 ENCOUNTER — Other Ambulatory Visit: Payer: Self-pay | Admitting: Family Medicine

## 2021-07-19 DIAGNOSIS — F419 Anxiety disorder, unspecified: Secondary | ICD-10-CM

## 2021-07-19 NOTE — Telephone Encounter (Signed)
Pt has upcoming appt 

## 2021-08-10 NOTE — Progress Notes (Signed)
Chief Complaint  Patient presents with   Annual Exam    Colonoscopy is coming up due in March 2023 after polyps were removed 12/2020. Patient getting Pfizer bivalent and influenza vaccines today   Carol Thomas is a 65 y.o. female who presents for a complete physical and follow-up on chronic problems.    Hypertension. She continues on amlodipine 10mg  and losartan-HCT 100-25.   BP's have been running 123-142/72-82 pulse 58-69.  Most BP's <130/80.  Had a slight headache/stress with BP 142/83. She has been having some mild headaches, which she relates to stress. Daughter-in-law died suddenly at 93 (had MVP, died from Comern[place]o) in 04/18/23.  Her son and 2 grandsons are living with her (68 and 71), in addition to her mother-in-law. She denies dizziness, chest pain, palpitations, shortness of breath, edema. Denies exertional chest pain or dyspnea.   She continues to work out with Physiological scientist 3x/week, 45-60 mins. 2x/week is cardio, once is strictly weights.   Anxiety: She is doing well on citalopram 20mg .  She is no longer working, is caring for her mother-in-law in her home (who had a stroke, with deficits in speech, balance, and has some dementia, which is worsening).  She goes 4 days/week to [facility], which helps. She now also has her son and grandkids. Working out helps with her moods, as does talking to her cousin (able to vent, she understands what is going on). She hasn't been sleeping as well. Her husband and son both work at night, is home with the kids and [place]. She is somewhat tired during the day, hasn't napped (though might today!)   Hyperlipidemia--She tries to follow lowfat, low cholesterol diet. She has been having some more potato chips since the grandkids are living with her. She gets the low sodium chips. She was on vacation in [place] last week.  She had some burgers, fries, a meat sauce on pasta while there.  She has never taken lipid-lowering medications, and prefers not to.  Her  cholesterol was higher in October 2021, when having more steak, sausage. Recheck in 11/2020 was only slightly lower, but HDL was higher. She cut back eating sausage and recheck in 18-Apr-2023 was improved, with normal ratio and borderline ASCVD risk (6.7%).  We had discussed doing Coronary CT calcium score, but she preferred to hold off. Due for recheck of lipids today. Diet as above.   Component Ref Range & Units 4 mo ago  (04/04/21) 8 mo ago  (11/29/20) 1 yr ago  (07/28/20) 1 yr ago  (01/19/20) 2 yr ago  (07/21/19) 2 yr ago  (03/11/19) 3 yr ago  (08/06/18)  Cholesterol, Total 100 - 199 mg/dL 223 High   245 High   251 High   215 High   216 High   225 High   214 High    Triglycerides 0 - 149 mg/dL 38  66  86  94  60  84  86   HDL >39 mg/dL 72  71  64  52  63  56  57   VLDL Cholesterol Cal 5 - 40 mg/dL 6  11  15  17  11  17  17    LDL Chol Calc (NIH) 0 - 99 mg/dL 145 High   163 High   172 High   146 High   142 High      Chol/HDL Ratio 0.0 - 4.4 ratio 3.1  3.5 CM  3.9 CM  4.1 CM  3.4 CM  H/o Impaired fasting glucose:  Sugar was 103 in May 2020, normal since then. Due for recheck. She tries to limit sugar/sweets/carbs, but recent increase in potato chips as reported above.  She continues to get regular exercise.   H/o vitamin D deficiency: level was low at 19 in 2016.  Last check was 30 in 2019.  She is currently taking 2000 IU daily.   Osteopenia: T-2.4 at L fem neck in 2021. FRAX was normal.  She takes a calcium supplement daily, along with separate vitamin D. She gets regular weight bearing exercise, but now only once a week with the trainer. She lifts 50# bags of feed for chicken and pellets for stove.  Immunization History  Administered Date(s) Administered   Hepatitis A 08/16/2006   Hepatitis A, Adult 10/02/2006, 04/12/2016   Influenza Inj Mdck Quad Pf 08/01/2017   Influenza,inj,Quad PF,6+ Mos 10/10/2012, 08/19/2014, 09/08/2015, 10/18/2016, 08/08/2018, 07/24/2019, 08/02/2020    Influenza-Unspecified 08/16/2017   PFIZER Comirnaty(Gray Top)Covid-19 Tri-Sucrose Vaccine 02/02/2021   PFIZER(Purple Top)SARS-COV-2 Vaccination 12/21/2019, 01/21/2020, 08/24/2020   Tdap 10/02/2006, 04/12/2016   Zoster Recombinat (Shingrix) 05/06/2018, 08/08/2018   Last Pap smear: 02/2014 Dr. Phineas Real; s/p hysterectomy in 1997 Last mammogram: 02/2021 Last colonoscopy: 06/2015 Dr. Penelope Coop.  Hemorrhoids, diverticulosis.  1 polyp (benign mucosa). She reports she had 20 polyps removed from colonoscopy in Feb/March (done by Dr. Estell Harpin replacement), so she will have a 1 year f/u colonoscopy in 2023. Last DEXA: 11/2019 T-2.4 L fem neck, normal FRAX Dentist: twice yearly Ophtho: yearly Exercise: She has been working out with Physiological scientist 3x/week, 45-60 mins of cardio 2x/week and weights 1x/week.  Also works in her yard, heavy lifting for [employer] and pellets for stove.   PMH, PSH, SH, and FH were reviewed and updated  Outpatient Encounter Medications as of 08/11/2021  Medication Sig   amLODipine (NORVASC) 10 MG tablet TAKE 1 TABLET DAILY   Calcium-Magnesium-Zinc (CA-MG-ZN PO) Take 1 tablet by mouth daily.   cholecalciferol (VITAMIN D) 1000 units tablet Take 2,000 Units by mouth daily.   citalopram (CELEXA) 20 MG tablet TAKE 1 TABLET DAILY   losartan-hydrochlorothiazide (HYZAAR) 100-25 MG tablet TAKE 1 TABLET DAILY   No facility-administered encounter medications on file as of 08/11/2021.   No Known Allergies   ROS:  The patient denies anorexia, fever, weight changes, decreased hearing, ear pain, sore throat, breast concerns, chest pain, palpitations, dizziness, syncope, dyspnea on exertion, cough, swelling, nausea, vomiting, diarrhea, abdominal pain, melena, hematochezia, hematuria, incontinence, dysuria, vaginal bleeding, discharge, odor or itch, genital lesions, joint pains, numbness, tingling, weakness, tremor, suspicious skin lesions, depression, abnormal bleeding/bruising, or enlarged  lymph nodes.  Some headaches recently related to stress and poor sleep.  Husband reports she snores. No known apnea, denies daytime somnolence. She reports some acid reflux with only certain foods, recently triggered by some red sauces.  Infrequent. She has h/o some constipation, currently r/b daily stool softener. Some pain in her L thumb and 3rd finger. L pinkie also now bothers her, sometimes keeps her awake at night. Anxiety is controlled.  Doesn't sleep well, whole life. Worse now since she is the only one home at night with MIL and 2 grandsons.  Finger pain also contributes. Sees dermatologist regularly (h/o melanoma).    PHYSICAL EXAM:  BP 132/88 (BP Location: Right Arm, Patient Position: Sitting)   Pulse 61   Ht 5' 5.5" (1.664 m)   Wt 159 lb 12.8 oz (72.5 kg)   SpO2 98%   BMI 26.19 kg/m  140/84 on repeat  Wt Readings from Last 3 Encounters:  08/11/21 159 lb 12.8 oz (72.5 kg)  02/02/21 158 lb 9.6 oz (71.9 kg)  08/02/20 155 lb 6.4 oz (70.5 kg)    General Appearance:    Alert, cooperative, no distress, appears stated age  Head:    Normocephalic, without obvious abnormality, atraumatic  Eyes:    PERRL, conjunctiva/corneas clear, EOM's intact, fundi not well visualized  Ears:    Normal TM's and external ear canals  Nose:   Not visualized, wearing mask due to COVID-19 pandemic  Throat:   Not visualized, wearing mask due to COVID-19 pandemic  Neck:   Supple, no lymphadenopathy;  thyroid:  no enlargement/ tenderness/nodules; no carotid bruit or JVD  Back:    Spine nontender, no curvature, ROM normal, no CVA  tenderness  Lungs:     Clear to auscultation bilaterally without wheezes, rales or    ronchi; respirations unlabored  Chest Wall:    No tenderness or deformity   Heart:    Regular rate and rhythm, S1 and S2 normal, no murmur, rub   or gallop  Breast Exam:    No tenderness, masses, or nipple discharge or inversion.   No axillary lymphadenopathy  Abdomen:     Soft,  non-tender, nondistended, normoactive bowel sounds,    no masses, no hepatosplenomegaly  Genitalia:    Normal external genitalia without lesions.  BUS and vagina normal. No abnormal vaginal discharge.  Uterus is surgically absent; adnexa not enlarged, nontender, no masses.   Rectal:    Normal tone, no masses or tenderness; guaiac negative stool  Extremities:   No clubbing, cyanosis or edema  Pulses:   2+ and symmetric all extremities  Skin:   Skin color, texture, turgor normal, no rashes or lesions  Lymph nodes:   Cervical, supraclavicular, and axillary nodes normal  Neurologic:   Normal strength, sensation and gait; reflexes 2+ and symmetric throughout                              Psych:   Normal mood, affect, hygiene and grooming.     ASSESSMENT/PLAN:  Annual physical exam - Plan: Comprehensive metabolic panel, CBC with Differential/Platelet, Lipid panel, VITAMIN D 25 Hydroxy (Vit-D Deficiency, Fractures)  Pure hypercholesterolemia - Due for recheck; cont low chol diet.  If LDL up (and ASCVD risk), to consider CT calcium score. Low chol diet reviewed - Plan: Lipid panel  Thrombocytopenia (HCC) - mildly low, due for recheck  Osteopenia of neck of left femur - cont Ca, D, weight-bearing exercise.  Recheck DEXA 11/2021 - Plan: DG Bone Density  Vitamin D deficiency - due for recheck (last level 30 in 2019), currently taing 2000 IU daily.   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 Reviewed low Na diet, regular exercise. Cont current meds, monitor at home and f/u if persistently >130/80 - Plan: amLODipine (NORVASC) 10 MG tablet, losartan-hydrochlorothiazide (HYZAAR) 100-25 MG tablet, Comprehensive metabolic panel  Anxiety disorder - controlled, cont  citalopram - Plan: citalopram (CELEXA) 20 MG tablet   To contact [facility] and get colonoscopy report and pathology.    Discussed monthly self breast exams and yearly mammograms; at least 30 minutes  of aerobic activity at least 5 days/week and weight-bearing exercise at least 2-3 days/week; proper sunscreen use reviewed; healthy diet, including goals of calcium and vitamin D intake and alcohol recommendations (less than or equal to 1 drink/day) reviewed; regular seatbelt use; changing batteries in smoke detectors.  Immunization recommendations discussed--flu shot given today and bivalent COVID booster given today. TCYELYH-90 next year. Colonoscopy recommendations reviewed, will get recent colonoscopy.  1 year f/u recommended per pt. Repeat DEXA 11/2021  F/u 6 mos med check, CPE 1 year ??welcome to Medicare??

## 2021-08-10 NOTE — Patient Instructions (Addendum)
  HEALTH MAINTENANCE RECOMMENDATIONS:  It is recommended that you get at least 30 minutes of aerobic exercise at least 5 days/week (for weight loss, you may need as much as 60-90 minutes). This can be any activity that gets your heart rate up. This can be divided in 10-15 minute intervals if needed, but try and build up your endurance at least once a week.  Weight bearing exercise is also recommended twice weekly.  Eat a healthy diet with lots of vegetables, fruits and fiber.  "Colorful" foods have a lot of vitamins (ie green vegetables, tomatoes, red peppers, etc).  Limit sweet tea, regular sodas and alcoholic beverages, all of which has a lot of calories and sugar.  Up to 1 alcoholic drink daily may be beneficial for women (unless trying to lose weight, watch sugars).  Drink a lot of water.  Calcium recommendations are 1200-1500 mg daily (1500 mg for postmenopausal women or women without ovaries), and vitamin D 1000 IU daily.  This should be obtained from diet and/or supplements (vitamins), and calcium should not be taken all at once, but in divided doses.  Monthly self breast exams and yearly mammograms for women over the age of 53 is recommended.  Sunscreen of at least SPF 30 should be used on all sun-exposed parts of the skin when outside between the hours of 10 am and 4 pm (not just when at beach or pool, but even with exercise, golf, tennis, and yard work!)  Use a sunscreen that says "broad spectrum" so it covers both UVA and UVB rays, and make sure to reapply every 1-2 hours.  Remember to change the batteries in your smoke detectors when changing your clock times in the spring and fall. Carbon monoxide detectors are recommended for your home.  Use your seat belt every time you are in a car, and please drive safely and not be distracted with cell phones and texting while driving.  Please check with your family to see if you have pauses in your breathing while you are sleeping.  If so, contact  us so we can set up a home sleep study to look for sleep apnea.   Please schedule bone density test for 11/2021. Ensure that you are getting a total of 1200-1500mg  daily of calcium, between your vitamins and your diet (see additional handout).  Please limit your chips (for the sodium aspect, and portions). Keep more fruit around for snacks.  Take a tylenol arthritis at bedtime so that your finger pain doesn't contribute to your trouble sleeping.

## 2021-08-11 ENCOUNTER — Encounter: Payer: Self-pay | Admitting: Family Medicine

## 2021-08-11 ENCOUNTER — Ambulatory Visit (INDEPENDENT_AMBULATORY_CARE_PROVIDER_SITE_OTHER): Admitting: Family Medicine

## 2021-08-11 ENCOUNTER — Telehealth: Payer: Self-pay

## 2021-08-11 ENCOUNTER — Other Ambulatory Visit: Payer: Self-pay

## 2021-08-11 VITALS — BP 132/88 | HR 61 | Ht 65.5 in | Wt 159.8 lb

## 2021-08-11 DIAGNOSIS — E7800 Pure hypercholesterolemia, unspecified: Secondary | ICD-10-CM | POA: Diagnosis not present

## 2021-08-11 DIAGNOSIS — Z0000 Encounter for general adult medical examination without abnormal findings: Secondary | ICD-10-CM | POA: Diagnosis not present

## 2021-08-11 DIAGNOSIS — M85852 Other specified disorders of bone density and structure, left thigh: Secondary | ICD-10-CM

## 2021-08-11 DIAGNOSIS — I10 Essential (primary) hypertension: Secondary | ICD-10-CM

## 2021-08-11 DIAGNOSIS — F419 Anxiety disorder, unspecified: Secondary | ICD-10-CM

## 2021-08-11 DIAGNOSIS — E559 Vitamin D deficiency, unspecified: Secondary | ICD-10-CM

## 2021-08-11 DIAGNOSIS — Z23 Encounter for immunization: Secondary | ICD-10-CM | POA: Diagnosis not present

## 2021-08-11 DIAGNOSIS — D696 Thrombocytopenia, unspecified: Secondary | ICD-10-CM | POA: Diagnosis not present

## 2021-08-11 DIAGNOSIS — R7301 Impaired fasting glucose: Secondary | ICD-10-CM

## 2021-08-11 MED ORDER — LOSARTAN POTASSIUM-HCTZ 100-25 MG PO TABS: 1.0000 | ORAL_TABLET | Freq: Every day | ORAL | 1 refills | Status: DC

## 2021-08-11 MED ORDER — AMLODIPINE BESYLATE 10 MG PO TABS: ORAL_TABLET | ORAL | 1 refills | Status: DC

## 2021-08-11 MED ORDER — CITALOPRAM HYDROBROMIDE 20 MG PO TABS: 20.0000 mg | ORAL_TABLET | Freq: Every day | ORAL | 3 refills | Status: DC

## 2021-08-11 NOTE — Telephone Encounter (Signed)
I got pt. Scheduled for a med check in April she wanted to go ahead and schedule her lab visit for that apt ahead of the med check apt. If you could put the orders in for that visit. Thank you.

## 2021-08-12 LAB — CBC WITH DIFFERENTIAL/PLATELET
Basophils Absolute: 0.1 10*3/uL (ref 0.0–0.2)
Basos: 1 %
EOS (ABSOLUTE): 0.2 10*3/uL (ref 0.0–0.4)
Eos: 4 %
Hematocrit: 41.3 % (ref 34.0–46.6)
Hemoglobin: 14.2 g/dL (ref 11.1–15.9)
Immature Grans (Abs): 0 10*3/uL (ref 0.0–0.1)
Immature Granulocytes: 0 %
Lymphocytes Absolute: 1.1 10*3/uL (ref 0.7–3.1)
Lymphs: 27 %
MCH: 29.8 pg (ref 26.6–33.0)
MCHC: 34.4 g/dL (ref 31.5–35.7)
MCV: 87 fL (ref 79–97)
Monocytes Absolute: 0.3 10*3/uL (ref 0.1–0.9)
Monocytes: 6 %
Neutrophils Absolute: 2.5 10*3/uL (ref 1.4–7.0)
Neutrophils: 62 %
Platelets: 169 10*3/uL (ref 150–450)
RBC: 4.76 x10E6/uL (ref 3.77–5.28)
RDW: 12.6 % (ref 11.7–15.4)
WBC: 4.1 10*3/uL (ref 3.4–10.8)

## 2021-08-12 LAB — COMPREHENSIVE METABOLIC PANEL
ALT: 20 IU/L (ref 0–32)
AST: 24 IU/L (ref 0–40)
Albumin/Globulin Ratio: 1.7 (ref 1.2–2.2)
Albumin: 4.8 g/dL (ref 3.8–4.8)
Alkaline Phosphatase: 101 IU/L (ref 44–121)
BUN/Creatinine Ratio: 11 — ABNORMAL LOW (ref 12–28)
BUN: 12 mg/dL (ref 8–27)
Bilirubin Total: 0.8 mg/dL (ref 0.0–1.2)
CO2: 25 mmol/L (ref 20–29)
Calcium: 10.9 mg/dL — ABNORMAL HIGH (ref 8.7–10.3)
Chloride: 97 mmol/L (ref 96–106)
Creatinine, Ser: 1.1 mg/dL — ABNORMAL HIGH (ref 0.57–1.00)
Globulin, Total: 2.8 g/dL (ref 1.5–4.5)
Glucose: 107 mg/dL — ABNORMAL HIGH (ref 70–99)
Potassium: 5.1 mmol/L (ref 3.5–5.2)
Sodium: 135 mmol/L (ref 134–144)
Total Protein: 7.6 g/dL (ref 6.0–8.5)
eGFR: 56 mL/min/{1.73_m2} — ABNORMAL LOW (ref >59)

## 2021-08-12 LAB — LIPID PANEL
Chol/HDL Ratio: 3 ratio (ref 0.0–4.4)
Cholesterol, Total: 216 mg/dL — ABNORMAL HIGH (ref 100–199)
HDL: 71 mg/dL (ref >39)
LDL Chol Calc (NIH): 132 mg/dL — ABNORMAL HIGH (ref 0–99)
Triglycerides: 71 mg/dL (ref 0–149)
VLDL Cholesterol Cal: 13 mg/dL (ref 5–40)

## 2021-08-12 LAB — VITAMIN D 25 HYDROXY (VIT D DEFICIENCY, FRACTURES): Vit D, 25-Hydroxy: 49.8 ng/mL (ref 30.0–100.0)

## 2021-08-12 NOTE — Telephone Encounter (Signed)
Orders entered.(Had to wait to review her results before knowing what she would need for April)

## 2021-08-12 NOTE — Telephone Encounter (Signed)
Thank you :)

## 2021-08-12 NOTE — Addendum Note (Signed)
Addended byRita Ohara on: 08/12/2021 03:17 PM   Modules accepted: Orders

## 2021-09-24 ENCOUNTER — Encounter: Payer: Self-pay | Admitting: Family Medicine

## 2021-09-26 ENCOUNTER — Other Ambulatory Visit: Payer: Self-pay

## 2021-09-26 ENCOUNTER — Encounter: Payer: Self-pay | Admitting: Family Medicine

## 2021-09-26 ENCOUNTER — Telehealth (INDEPENDENT_AMBULATORY_CARE_PROVIDER_SITE_OTHER): Admitting: Family Medicine

## 2021-09-26 VITALS — BP 128/89 | HR 90 | Temp 98.1°F | Ht 65.5 in | Wt 159.0 lb

## 2021-09-26 DIAGNOSIS — U071 COVID-19: Secondary | ICD-10-CM

## 2021-09-26 NOTE — Patient Instructions (Addendum)
Do not take tylenol along with the coricidin that you have (contains acetaminophen, same as tylenol). You may use Guaifenesin and dextromethorpan (Mucinex DM or Robitussin DM) if needed for cough, along with the Coricidin. You may use ibuprofen/advil along with the Coricidin, if needed, for any fever or aches or pain. Be sure to take it with food.  Stay well hydrated. Bland diet.  Isolate days 1-5 (12/11-12/15) By Friday 12/16, if you don't have fever and if your respiratory symptoms are improving, you may leave the house, but need to be masked 100% of the time.Stay masked 12/16-12/20. On 12/21 you may remove the mask.  Contact us if you have persistent/worsening fever, cough, pain with breathing, chest pain, shortness of breath.

## 2021-09-26 NOTE — Progress Notes (Signed)
Start time: 9:47 End time: 10:03  Virtual Visit via Video Note  I connected with Carol Thomas on 09/26/21 by a video enabled telemedicine application and verified that I am speaking with the correct person using two identifiers.  Location: Patient: home Provider: office   I discussed the limitations of evaluation and management by telemedicine and the availability of in person appointments. The patient expressed understanding and agreed to proceed.  History of Present Illness:  Chief Complaint  Patient presents with   Covid Positive    VIRTUAL covid positive on Saturday. Symptoms include runny nose, cough, lack of appetite, and gurgling stomach. Had HA but that is better now. No fever. Mother in law that lives with her tested positive last Wednesday. Husband negative as of right now.    Symptoms started 12/10 with runny nose and headache.  Now she is also coughing. She has decreased appetite, some nausea. No vomtiing or diarrhea. No loss of smell or taste.  She has been taking Tylenol (plain).  She has also been taking Coricidin HBP cold and flu (per box, contains Chlorpheniramine, acetaminophen); taking this q4-6 hours, though hasn't taken any yet today.  Has had all COVID boosters (including bivalent booster)  PMH, PSH, SH reviewed  Outpatient Encounter Medications as of 09/26/2021  Medication Sig Note   acetaminophen (TYLENOL) 500 MG tablet Take 1,500 mg by mouth every 6 (six) hours as needed. 09/26/2021: Rapid Release-took yesterday am   amLODipine (NORVASC) 10 MG tablet TAKE 1 TABLET DAILY    cholecalciferol (VITAMIN D) 1000 units tablet Take 2,000 Units by mouth daily.    citalopram (CELEXA) 20 MG tablet Take 1 tablet (20 mg total) by mouth daily.    DM-APAP-CPM (CORICIDIN HBP PO) Take 2 tablets by mouth as needed. 09/26/2021: Last dose yesterday at 3pm   losartan-hydrochlorothiazide (HYZAAR) 100-25 MG tablet Take 1 tablet by mouth daily.    Calcium-Magnesium-Zinc  (CA-MG-ZN PO) Take 1 tablet by mouth daily. (Patient not taking: Reported on 09/26/2021) 09/26/2021: Takes about 2-3 times a week   No facility-administered encounter medications on file as of 09/26/2021.   No Known Allergies  ROS: +HA, URI symptoms, nausea, grumbly stomach, decreased appetite per HPI.  No chest pain, shortness of breath, rash, or other concerns.  See HPI   Observations/Objective:  BP 128/89   Pulse 90   Temp 98.1 F (36.7 C) (Temporal)   Ht 5' 5.5" (1.664 m)   Wt 159 lb (72.1 kg)   BMI 26.06 kg/m   Pleasant female, wearing mask. She appears comfortable.  Normal speech. Very rare cough during visit. She is alert and oriented. Exam is limited due to virtual nature of the visit.   Assessment and Plan:  COVID-19 virus infection - supportive measures reviewed. Mild sx, not really high risk, declines Paxlovid. Stop Tylenol, cont Coricidin, and add Mucinex DM prn for cough. f/u if worse  Briefly discussed Paxlovid, minimal risk factor (HTN only), declines. Reviewed isolation/masking recommendations. Reviewed OTC measures and s/sx for which she should seek re-evaluation.   Follow Up Instructions:    I discussed the assessment and treatment plan with the patient. The patient was provided an opportunity to ask questions and all were answered. The patient agreed with the plan and demonstrated an understanding of the instructions.   The patient was advised to call back or seek an in-person evaluation if the symptoms worsen or if the condition fails to improve as anticipated.  I spent 20 minutes dedicated to the care  of this patient, including pre-visit review of records, face to face time, post-visit ordering of testing and documentation.    Vikki Ports, MD

## 2022-01-13 ENCOUNTER — Ambulatory Visit
Admission: RE | Admit: 2022-01-13 | Discharge: 2022-01-13 | Disposition: A | Discharge status: Home / Self Care | Source: Ambulatory Visit | Attending: Family Medicine | Admitting: Family Medicine

## 2022-01-13 DIAGNOSIS — M85852 Other specified disorders of bone density and structure, left thigh: Secondary | ICD-10-CM

## 2022-02-02 ENCOUNTER — Other Ambulatory Visit

## 2022-02-02 DIAGNOSIS — R7301 Impaired fasting glucose: Secondary | ICD-10-CM

## 2022-02-02 DIAGNOSIS — E7800 Pure hypercholesterolemia, unspecified: Secondary | ICD-10-CM

## 2022-02-03 LAB — LIPID PANEL
Chol/HDL Ratio: 3.2 ratio (ref 0.0–4.4)
Cholesterol, Total: 223 mg/dL — ABNORMAL HIGH (ref 100–199)
HDL: 70 mg/dL (ref >39)
LDL Chol Calc (NIH): 143 mg/dL — ABNORMAL HIGH (ref 0–99)
Triglycerides: 58 mg/dL (ref 0–149)
VLDL Cholesterol Cal: 10 mg/dL (ref 5–40)

## 2022-02-03 LAB — HEMOGLOBIN A1C
Est. average glucose Bld gHb Est-mCnc: 114 mg/dL
Hgb A1c MFr Bld: 5.6 % (ref 4.8–5.6)

## 2022-02-03 LAB — GLUCOSE, RANDOM: Glucose: 105 mg/dL — ABNORMAL HIGH (ref 70–99)

## 2022-02-06 NOTE — Progress Notes (Signed)
Chief Complaint  ?Patient presents with  ? Hypertension  ?  Nonfasting med check. No concerns.   ? ?Patient presents for 6 month f/u on chronic problems. See below for labs done prior to her visit. ? ?Hypertension. She continues on amlodipine 10mg and losartan-HCT 100-25.   ?Hasn't been checking BP often at home, recalls 120's/70's. ? ?6 months ago she had been having mild headaches, had been stressed--her son and 2 grandsons had been living with her (after daughter-in-law died suddenly at 40 (had MVP, died from MI), along with her mother-in-law. ?She now only occasionally has a mild headache (despite still having many stressors, see below). ?She denies dizziness, chest pain, palpitations, shortness of breath, edema. ?Denies exertional chest pain or dyspnea. ?  ?She continues to work out with personal trainer 3x/week, 45-60 mins. 2x/week is cardio, once is strictly weights. ?  ?Anxiety: She is doing well on citalopram 20mg.  She is no longer working, is caring for her mother-in-law in her home (who had a stroke, with deficits in speech, balance, and has some dementia, which is worsening).  MIL broke her leg in 11/2021, recently got home from [place]. She is back to going 4 days/week to [facility]. ?At her last visit she reported she hadn't been sleeping as well. Her husband and son both work at night, is home with the kids and MIL. Today she reports sleeping a little better, and no longer feeling tired during the day. ? ?Current stressors--unchanged as far as son, 2 grandsons and MIL living with her.  Her mother broke her wrist; her sister isn't doing well taking care of her, constantly calling patient.  ?  ?Hyperlipidemia--She tries to follow lowfat, low cholesterol diet. She had been having some more potato chips since the grandkids are living with her, but cut back, no longer eating them daily. Has 1-2x/week, in general, low sodium kind. ?She has never taken lipid-lowering medications, and prefers not to.   ?Her cholesterol was higher in October 2021, when having more steak, sausage. Recheck in 11/2020 was only slightly lower, but HDL was higher. She only rarely eats sausage now. ?We had discussed doing Coronary CT calcium score, but she preferred to hold off.  ?Lipids were lower in 07/2021.  ?Component Ref Range & Units 5 mo ago ?(08/11/21) 10 mo ago ?(04/04/21) 1 yr ago ?(11/29/20) 1 yr ago ?(07/28/20) 2 yr ago ?(01/19/20) 2 yr ago ?(07/21/19) 2 yr ago ?(03/11/19)  ?Cholesterol, Total 100 - 199 mg/dL 216 High   223 High   245 High   251 High   215 High   216 High   225 High    ?Triglycerides 0 - 149 mg/dL 71  38  66  86  94  60  84   ?HDL >39 mg/dL 71  72  71  64  52  63  56   ?VLDL Cholesterol Cal 5 - 40 mg/dL 13  6  11  15  17  11  17   ?LDL Chol Calc (NIH) 0 - 99 mg/dL 132 High   145 High   163 High   172 High   146 High   142 High     ?Chol/HDL Ratio 0.0 - 4.4 ratio 3.0  3.1 CM  3.5 CM  3.9 CM  4.1 CM     ? ?  ?H/o Impaired fasting glucose:  Fasting sugar was 107 in October. She tries to limit sugar/sweets/carbs. She cut back on chips.  Pasta is a   weak spot for her. She switched to cauliflower rice. She continues to get regular exercise.  ?  ?H/o vitamin D deficiency: level was low at 19 in 2016.  Last check was normal at 49.8 in 07/2021, when taking 2000 IU daily.  She continues on same vitamins. ?  ?Osteopenia: 12/2021 DEXA showed T-2.3 R fem neck; FRAX 19.3/3.5%. Prior DEXA was in 2021--T-2.4 at L fem neck in 2021. FRAX was normal.   ?She takes a calcium supplement daily, along with separate vitamin D. ?She gets regular weight bearing exercise, but now only once a week with the trainer. She lifts 50# bags of feed for chicken and pellets for stove. ? ? ?PMH, PSH, SH reviewed ? ?Outpatient Encounter Medications as of 02/08/2022  ?Medication Sig Note  ? amLODipine (NORVASC) 10 MG tablet TAKE 1 TABLET DAILY   ? cholecalciferol (VITAMIN D) 1000 units tablet Take 2,000 Units by mouth daily.   ? citalopram (CELEXA) 20 MG  tablet Take 1 tablet (20 mg total) by mouth daily.   ? losartan-hydrochlorothiazide (HYZAAR) 100-25 MG tablet Take 1 tablet by mouth daily.   ? Calcium-Magnesium-Zinc (CA-MG-ZN PO) Take 1 tablet by mouth daily. (Patient not taking: Reported on 09/26/2021) 02/08/2022: 1-2 times a week  ? [DISCONTINUED] acetaminophen (TYLENOL) 500 MG tablet Take 1,500 mg by mouth every 6 (six) hours as needed. 09/26/2021: Rapid Release-took yesterday am  ? [DISCONTINUED] DM-APAP-CPM (CORICIDIN HBP PO) Take 2 tablets by mouth as needed. 09/26/2021: Last dose yesterday at 3pm  ? ?No facility-administered encounter medications on file as of 02/08/2022.  ? ?No Known Allergies ? ? ?ROS: no fever, chills, URI symptoms, headaches (rare, mild), dizziness, chest pain, palpitations, shortness of breath.  No GI or GU complaints. No bleeding, bruising, rash. ?Heartburn with certain foods, not daily. ?Moods are stable. See HPI. ? ? ? ?PHYSICAL EXAM: ? ?BP 138/86   Pulse 60   Ht 5' 5.5" (1.664 m)   Wt 165 lb 3.2 oz (74.9 kg)   BMI 27.07 kg/m?  ? ?130/74 on repeat by MD ? ?Wt Readings from Last 3 Encounters:  ?02/08/22 165 lb 3.2 oz (74.9 kg)  ?09/26/21 159 lb (72.1 kg)  ?08/11/21 159 lb 12.8 oz (72.5 kg)  ? ?Well developed, pleasant female, in no distress ?HEENT: PERRL, EOMI, conjunctiva and sclera clear.  ?Neck: no lymphadenopathy, thyromegaly or carotid bruit ?Heart: regular rate and rhythm without murmur ?Lungs: clear bilaterally ?Back: no CVA or spinal tenderness ?Abdomen: soft, nontender, no organomegaly or mass ?Extremities: no edema, 2+ pulses ?Skin: normal turgor, no rashes/lesions ?Neuro: alert and oriented, normal gait ?Psych: normal mood, affect, hygiene and grooming, normal eye contact and speech ?  ? ?Lab Results  ?Component Value Date  ? HGBA1C 5.6 02/02/2022  ? ?Fasting glucose 105 ? ?Lab Results  ?Component Value Date  ? CHOL 223 (H) 02/02/2022  ? HDL 70 02/02/2022  ? LDLCALC 143 (H) 02/02/2022  ? TRIG 58 02/02/2022  ? CHOLHDL 3.2  02/02/2022  ? ? ? ?ASSESSMENT/PLAN: ? ?Essential (primary) hypertension - controlled, cont current meds.  Reviewed low Na diet. Cont regular exercise - Plan: amLODipine (NORVASC) 10 MG tablet, losartan-hydrochlorothiazide (HYZAAR) 100-25 MG tablet ? ?Pure hypercholesterolemia - borderline ASCVD.  LDL slightly higher, HDL is good. Discussed potential for Ca score/scan. Cont low cholesterol diet ? ?Impaired fasting glucose - fasting glucose mildly elevated, A1c normal. Cont low carb/sugar diet, regular exercise ? ?Osteopenia of neck of left femur - Discussed risks/SE/benefit of tx with bisphosphonate, given elevated FRAX.  Discussed Ca, D and weight-bearing exercise. Contact us if SE - Plan: alendronate (FOSAMAX) 70 MG tablet ? ?Fracture Risk Assessment Score (FRAX) indicating greater than 3% risk for hip fracture - bisphosphonate recommended due to elevated FRAX score. Risks/SE reviewed.  Will contact us if SE, can switch to monthly (vs stop and recheck 2 yrs) - Plan: alendronate (FOSAMAX) 70 MG tablet ? ?Generalized anxiety disorder - controlled with citalopram.  Managing stressors well. ? ? ?The 10-year ASCVD risk score (Arnett DK, et al., 2019) is: 7.4% ?  Values used to calculate the score: ?    Age: 65 years ?    Sex: Female ?    Is Non-Hispanic African American: No ?    Diabetic: No ?    Tobacco smoker: No ?    Systolic Blood Pressure: 138 mmHg ?    Is BP treated: Yes ?    HDL Cholesterol: 70 mg/dL ?    Total Cholesterol: 223 mg/dL ? ?Borderline ASCVD.  Reviewed potential to check Ca score, will consider. ? ?F/u as scheduled for CPE, fasting labs prior--cbc, c-met, lipid ? ? ? ?

## 2022-02-07 ENCOUNTER — Other Ambulatory Visit: Payer: Self-pay | Admitting: Family Medicine

## 2022-02-07 DIAGNOSIS — I10 Essential (primary) hypertension: Secondary | ICD-10-CM

## 2022-02-08 ENCOUNTER — Encounter: Payer: Self-pay | Admitting: Family Medicine

## 2022-02-08 ENCOUNTER — Ambulatory Visit (INDEPENDENT_AMBULATORY_CARE_PROVIDER_SITE_OTHER): Admitting: Family Medicine

## 2022-02-08 VITALS — BP 130/74 | HR 60 | Ht 65.5 in | Wt 165.2 lb

## 2022-02-08 DIAGNOSIS — Z9189 Other specified personal risk factors, not elsewhere classified: Secondary | ICD-10-CM

## 2022-02-08 DIAGNOSIS — R7301 Impaired fasting glucose: Secondary | ICD-10-CM | POA: Diagnosis not present

## 2022-02-08 DIAGNOSIS — Z5181 Encounter for therapeutic drug level monitoring: Secondary | ICD-10-CM

## 2022-02-08 DIAGNOSIS — E7800 Pure hypercholesterolemia, unspecified: Secondary | ICD-10-CM | POA: Diagnosis not present

## 2022-02-08 DIAGNOSIS — I10 Essential (primary) hypertension: Secondary | ICD-10-CM

## 2022-02-08 DIAGNOSIS — F411 Generalized anxiety disorder: Secondary | ICD-10-CM

## 2022-02-08 DIAGNOSIS — M85852 Other specified disorders of bone density and structure, left thigh: Secondary | ICD-10-CM | POA: Diagnosis not present

## 2022-02-08 MED ORDER — ALENDRONATE SODIUM 70 MG PO TABS: 70.0000 mg | ORAL_TABLET | ORAL | 11 refills | Status: DC

## 2022-02-08 MED ORDER — LOSARTAN POTASSIUM-HCTZ 100-25 MG PO TABS: 1.0000 | ORAL_TABLET | Freq: Every day | ORAL | 1 refills | Status: DC

## 2022-02-08 MED ORDER — AMLODIPINE BESYLATE 10 MG PO TABS: ORAL_TABLET | ORAL | 1 refills | Status: DC

## 2022-02-08 NOTE — Patient Instructions (Addendum)
Start alendronate once a week. ?Take it on an empty stomach, with a full glass of water.  Do not lay down or recline, and do not eat or take other medications for at least 30 minutes. ? ?If you notice you have heartburn for a day or two after taking this, you may want to try taking prilosec or pepcid the night before your dose, and then as needed. ?If you continually have issues with worsening reflux, then switching it to a once monthly medication (Boniva or Actonel) would make more sense than continuing on a weekly medication. ? ?Be sure to try and get '1500mg'$  of calcium from all sources (as much from the diet as possible), as this is needed for the medication to work to strengthen your bones. ? ?Continue all other medications. ? ?We discussed the possibility of coronary  calcium scan to further stratify your cardiac risks, and the potential need for cholesterol-lowering medications. ?Let us know if this is something that you're interested in. ?

## 2022-02-09 ENCOUNTER — Encounter: Payer: Self-pay | Admitting: Family Medicine

## 2022-02-09 DIAGNOSIS — E7800 Pure hypercholesterolemia, unspecified: Secondary | ICD-10-CM

## 2022-02-17 ENCOUNTER — Telehealth: Payer: Self-pay

## 2022-02-17 NOTE — Telephone Encounter (Signed)
Pt. Was told to call back if she had not heard anything about her CT cardiac scoring test. They have not called yet to schedule her for that and the order was out in on 02/10/22. ?

## 2022-02-20 NOTE — Telephone Encounter (Signed)
Spoke with [facility] and she has gotten in touch with the person, [place] and she will schedule. Patient advised. ?

## 2022-02-28 ENCOUNTER — Ambulatory Visit (HOSPITAL_BASED_OUTPATIENT_CLINIC_OR_DEPARTMENT_OTHER)
Admission: RE | Admit: 2022-02-28 | Discharge: 2022-02-28 | Disposition: A | Discharge status: Home / Self Care | Source: Ambulatory Visit | Attending: Family Medicine | Admitting: Family Medicine

## 2022-02-28 DIAGNOSIS — E7800 Pure hypercholesterolemia, unspecified: Secondary | ICD-10-CM | POA: Insufficient documentation

## 2022-03-02 NOTE — Progress Notes (Signed)
14 min phone conversation explaining aortic atherosclerosis, indication for statins, risk/SE and way to take CoQ10 (preventatively vs adding on later), discussed that statins would be indicated with AA even if cholesterol was normal. Discussed hereditary component, and knowing that she is doing everything possible, but recommendation is to start.  She will c/b once starts to set up f/u lab visit.  She was not crying, asked a lot of questions, and felt like she got all questions answered.

## 2022-03-05 ENCOUNTER — Encounter: Payer: Self-pay | Admitting: Family Medicine

## 2022-03-06 ENCOUNTER — Telehealth: Payer: Self-pay | Admitting: Family Medicine

## 2022-03-06 ENCOUNTER — Other Ambulatory Visit: Payer: Self-pay | Admitting: *Deleted

## 2022-03-06 DIAGNOSIS — M85852 Other specified disorders of bone density and structure, left thigh: Secondary | ICD-10-CM

## 2022-03-06 DIAGNOSIS — Z9189 Other specified personal risk factors, not elsewhere classified: Secondary | ICD-10-CM

## 2022-03-06 MED ORDER — ALENDRONATE SODIUM 70 MG PO TABS: 70.0000 mg | ORAL_TABLET | ORAL | 1 refills | Status: DC

## 2022-03-06 MED ORDER — ROSUVASTATIN CALCIUM 10 MG PO TABS: 10.0000 mg | ORAL_TABLET | Freq: Every day | ORAL | 0 refills | Status: DC

## 2022-03-06 NOTE — Telephone Encounter (Signed)
[facility] sent refill request for alendronate sodium 70 mg please send to the  [place], [place]

## 2022-03-06 NOTE — Telephone Encounter (Signed)
Done

## 2022-03-17 ENCOUNTER — Telehealth: Payer: Self-pay

## 2022-03-17 NOTE — Telephone Encounter (Signed)
Pt. Called stating that Dr. Tomi Bamberger started her on two new meds few weeks ago Fosamax and Crestor. She has been experiencing head aches since then and wasn't sure which one of the meds is causing head aches.

## 2022-04-03 HISTORY — PX: UPPER GI ENDOSCOPY: SHX6162

## 2022-04-03 LAB — HM COLONOSCOPY

## 2022-04-15 ENCOUNTER — Encounter: Payer: Self-pay | Admitting: Family Medicine

## 2022-04-19 ENCOUNTER — Encounter: Payer: Self-pay | Admitting: *Deleted

## 2022-04-19 ENCOUNTER — Encounter: Payer: Self-pay | Admitting: Family Medicine

## 2022-06-02 ENCOUNTER — Other Ambulatory Visit

## 2022-06-02 DIAGNOSIS — R7301 Impaired fasting glucose: Secondary | ICD-10-CM

## 2022-06-02 DIAGNOSIS — E7800 Pure hypercholesterolemia, unspecified: Secondary | ICD-10-CM

## 2022-06-02 DIAGNOSIS — I10 Essential (primary) hypertension: Secondary | ICD-10-CM

## 2022-06-02 DIAGNOSIS — Z5181 Encounter for therapeutic drug level monitoring: Secondary | ICD-10-CM

## 2022-06-03 LAB — LIPID PANEL
Chol/HDL Ratio: 2.1 ratio (ref 0.0–4.4)
Cholesterol, Total: 147 mg/dL (ref 100–199)
HDL: 69 mg/dL (ref >39)
LDL Chol Calc (NIH): 68 mg/dL (ref 0–99)
Triglycerides: 46 mg/dL (ref 0–149)
VLDL Cholesterol Cal: 10 mg/dL (ref 5–40)

## 2022-06-03 LAB — CBC WITH DIFFERENTIAL/PLATELET
Basophils Absolute: 0 10*3/uL (ref 0.0–0.2)
Basos: 1 %
EOS (ABSOLUTE): 0.1 10*3/uL (ref 0.0–0.4)
Eos: 3 %
Hematocrit: 37.5 % (ref 34.0–46.6)
Hemoglobin: 12.8 g/dL (ref 11.1–15.9)
Immature Grans (Abs): 0 10*3/uL (ref 0.0–0.1)
Immature Granulocytes: 0 %
Lymphocytes Absolute: 0.9 10*3/uL (ref 0.7–3.1)
Lymphs: 28 %
MCH: 29.5 pg (ref 26.6–33.0)
MCHC: 34.1 g/dL (ref 31.5–35.7)
MCV: 86 fL (ref 79–97)
Monocytes Absolute: 0.3 10*3/uL (ref 0.1–0.9)
Monocytes: 9 %
Neutrophils Absolute: 1.8 10*3/uL (ref 1.4–7.0)
Neutrophils: 59 %
Platelets: 79 10*3/uL — CL (ref 150–450)
RBC: 4.34 x10E6/uL (ref 3.77–5.28)
RDW: 12.5 % (ref 11.7–15.4)
WBC: 3 10*3/uL — ABNORMAL LOW (ref 3.4–10.8)

## 2022-06-03 LAB — COMPREHENSIVE METABOLIC PANEL
ALT: 21 IU/L (ref 0–32)
AST: 25 IU/L (ref 0–40)
Albumin/Globulin Ratio: 2 (ref 1.2–2.2)
Albumin: 4.5 g/dL (ref 3.9–4.9)
Alkaline Phosphatase: 73 IU/L (ref 44–121)
BUN/Creatinine Ratio: 12 (ref 12–28)
BUN: 13 mg/dL (ref 8–27)
Bilirubin Total: 0.8 mg/dL (ref 0.0–1.2)
CO2: 25 mmol/L (ref 20–29)
Calcium: 9.5 mg/dL (ref 8.7–10.3)
Chloride: 99 mmol/L (ref 96–106)
Creatinine, Ser: 1.08 mg/dL — ABNORMAL HIGH (ref 0.57–1.00)
Globulin, Total: 2.3 g/dL (ref 1.5–4.5)
Glucose: 96 mg/dL (ref 70–99)
Potassium: 4.9 mmol/L (ref 3.5–5.2)
Sodium: 135 mmol/L (ref 134–144)
Total Protein: 6.8 g/dL (ref 6.0–8.5)
eGFR: 57 mL/min/{1.73_m2} — ABNORMAL LOW (ref >59)

## 2022-06-05 ENCOUNTER — Encounter: Payer: Self-pay | Admitting: Family Medicine

## 2022-06-06 NOTE — Progress Notes (Unsigned)
  No chief complaint on file.   Patient presents to discuss recent lab results. She was only supposed to have lipid/LFT's repeated since starting on a statin after CT calcium scan showed aortic atherosclerosis (calcium score was 0). Looks like her pre-physical labs were all released (c-met, CBC, lipid).  She was noted to have low platelets. She has had some very mild low values in the past, this time was much lower at 79. She also had low WBC, also has had similar low readings in the past. Hemoglobin slightly lower, but normal.  Component Ref Range & Units 4 d ago (06/02/22) 9 mo ago (08/11/21) 1 yr ago (11/29/20) 1 yr ago (07/28/20) 3 yr ago (03/11/19) 4 yr ago (10/22/17) 7 yr ago (02/22/15)  WBC 3.4 - 10.8 x10E3/uL 3.0 Low   4.1  3.1 Low   3.6  3.5  3.2 Low  R  4.1 R   RBC 3.77 - 5.28 x10E6/uL 4.34  4.76  4.59  4.62  4.46  4.55 R  4.86 R   Hemoglobin 11.1 - 15.9 g/dL 12.8  14.2  13.6  13.6  13.4  13.3 R  14.1 R   Hematocrit 34.0 - 46.6 % 37.5  41.3  40.1  40.8  37.8  38.4 R  41.2 R   MCV 79 - 97 fL 86  87  87  88  85  84.4 R  84.8 R   MCH 26.6 - 33.0 pg 29.5  29.8  29.6  29.4  30.0  29.2 R  29.0 R   MCHC 31.5 - 35.7 g/dL 34.1  34.4  33.9  33.3  35.4  34.6 R  34.2 R   RDW 11.7 - 15.4 % 12.5  12.6  13.1  12.9  12.5  13.2 R  13.9 R   Platelets 150 - 450 x10E3/uL 79 Low Panic   169  140 Low   135 Low   174  170     Since her last visit she was started on PPI for EOE by Dr. Therisa Doyne   Hyperlipidemia:  Lipids were significantly better since starting rosuvastatin 49m daily. She denies side effects.  Lab Results  Component Value Date   CHOL 147 06/02/2022   HDL 69 06/02/2022   LDLCALC 68 06/02/2022   TRIG 46 06/02/2022   CHOLHDL 2.1 06/02/2022     PMH, PSH, SH reviewed    ROS:   PHYSICAL EXAM:  There were no vitals taken for this visit.  Wt Readings from Last 3 Encounters:  02/08/22 165 lb 3.2 oz (74.9 kg)  09/26/21 159 lb (72.1 kg)  08/11/21 159 lb 12.8 oz (72.5 kg)       ASSESSMENT/PLAN:   Looks like her physical labs were released (separate lipid/LFT's never ordered for her 2-3 month f/u as requested). Has physical in November with lab visit prior in October. May not need the lab visit   Thrombocytopenia is listed as potential reaction to rosuvastatin. Repeat CBC today. If still low, can lower dose and see if dose dependent vs change statin

## 2022-06-08 ENCOUNTER — Encounter: Payer: Self-pay | Admitting: Family Medicine

## 2022-06-08 ENCOUNTER — Ambulatory Visit (INDEPENDENT_AMBULATORY_CARE_PROVIDER_SITE_OTHER): Admitting: Family Medicine

## 2022-06-08 VITALS — BP 130/80 | HR 67 | Wt 170.0 lb

## 2022-06-08 DIAGNOSIS — Z23 Encounter for immunization: Secondary | ICD-10-CM

## 2022-06-08 DIAGNOSIS — E7800 Pure hypercholesterolemia, unspecified: Secondary | ICD-10-CM

## 2022-06-08 DIAGNOSIS — D696 Thrombocytopenia, unspecified: Secondary | ICD-10-CM | POA: Diagnosis not present

## 2022-06-08 DIAGNOSIS — I10 Essential (primary) hypertension: Secondary | ICD-10-CM

## 2022-06-08 DIAGNOSIS — K200 Eosinophilic esophagitis: Secondary | ICD-10-CM

## 2022-06-08 DIAGNOSIS — I700 Atherosclerosis of aorta: Secondary | ICD-10-CM

## 2022-06-08 NOTE — Patient Instructions (Signed)
We discussed getting the new COVID booster when it comes out (should be next month). Consider getting the new RSV vaccine (once you're 65, should be covered by Medicare, likely need to get this vaccine from the pharmacy).  We are double-checking your platelets to see if the rosuvastatin should be changed.

## 2022-06-09 LAB — CBC WITH DIFFERENTIAL/PLATELET
Basophils Absolute: 0 10*3/uL (ref 0.0–0.2)
Basos: 1 %
EOS (ABSOLUTE): 0.1 10*3/uL (ref 0.0–0.4)
Eos: 2 %
Hematocrit: 38.3 % (ref 34.0–46.6)
Hemoglobin: 13 g/dL (ref 11.1–15.9)
Immature Grans (Abs): 0 10*3/uL (ref 0.0–0.1)
Immature Granulocytes: 0 %
Lymphocytes Absolute: 0.9 10*3/uL (ref 0.7–3.1)
Lymphs: 20 %
MCH: 29.7 pg (ref 26.6–33.0)
MCHC: 33.9 g/dL (ref 31.5–35.7)
MCV: 88 fL (ref 79–97)
Monocytes Absolute: 0.3 10*3/uL (ref 0.1–0.9)
Monocytes: 7 %
Neutrophils Absolute: 3.1 10*3/uL (ref 1.4–7.0)
Neutrophils: 70 %
Platelets: 79 10*3/uL — CL (ref 150–450)
RBC: 4.37 x10E6/uL (ref 3.77–5.28)
RDW: 12.7 % (ref 11.7–15.4)
WBC: 4.4 10*3/uL (ref 3.4–10.8)

## 2022-06-10 ENCOUNTER — Encounter: Payer: Self-pay | Admitting: Family Medicine

## 2022-06-10 DIAGNOSIS — E7800 Pure hypercholesterolemia, unspecified: Secondary | ICD-10-CM

## 2022-06-10 DIAGNOSIS — Z5181 Encounter for therapeutic drug level monitoring: Secondary | ICD-10-CM

## 2022-06-10 DIAGNOSIS — D696 Thrombocytopenia, unspecified: Secondary | ICD-10-CM

## 2022-06-11 MED ORDER — PRAVASTATIN SODIUM 40 MG PO TABS: 40.0000 mg | ORAL_TABLET | Freq: Every day | ORAL | 0 refills | Status: DC

## 2022-06-12 ENCOUNTER — Telehealth: Payer: Self-pay | Admitting: Family Medicine

## 2022-06-12 ENCOUNTER — Other Ambulatory Visit: Payer: Self-pay | Admitting: *Deleted

## 2022-06-12 DIAGNOSIS — Z9189 Other specified personal risk factors, not elsewhere classified: Secondary | ICD-10-CM

## 2022-06-12 DIAGNOSIS — M85852 Other specified disorders of bone density and structure, left thigh: Secondary | ICD-10-CM

## 2022-06-12 MED ORDER — PRAVASTATIN SODIUM 40 MG PO TABS: 40.0000 mg | ORAL_TABLET | Freq: Every day | ORAL | 0 refills | Status: DC

## 2022-06-12 MED ORDER — ALENDRONATE SODIUM 70 MG PO TABS: 70.0000 mg | ORAL_TABLET | ORAL | 0 refills | Status: DC

## 2022-06-12 NOTE — Telephone Encounter (Signed)
Pt called and asks to switch pharmacies temporarily to [facility] near [address] for her Fosamax and Pravastatan because she is out and cant wait for home delivery.

## 2022-06-12 NOTE — Telephone Encounter (Signed)
done

## 2022-07-25 ENCOUNTER — Encounter: Payer: Self-pay | Admitting: Internal Medicine

## 2022-08-10 ENCOUNTER — Other Ambulatory Visit

## 2022-08-10 ENCOUNTER — Other Ambulatory Visit: Payer: Self-pay | Admitting: Family Medicine

## 2022-08-10 DIAGNOSIS — F419 Anxiety disorder, unspecified: Secondary | ICD-10-CM

## 2022-08-10 DIAGNOSIS — I10 Essential (primary) hypertension: Secondary | ICD-10-CM

## 2022-08-10 NOTE — Telephone Encounter (Signed)
Refill request 06/08/22 next apt 08/17/22

## 2022-08-11 ENCOUNTER — Other Ambulatory Visit: Payer: Medicare Other

## 2022-08-11 DIAGNOSIS — E7800 Pure hypercholesterolemia, unspecified: Secondary | ICD-10-CM

## 2022-08-11 DIAGNOSIS — D696 Thrombocytopenia, unspecified: Secondary | ICD-10-CM

## 2022-08-11 DIAGNOSIS — Z5181 Encounter for therapeutic drug level monitoring: Secondary | ICD-10-CM

## 2022-08-11 NOTE — Telephone Encounter (Signed)
Called pt. LM for call back.

## 2022-08-12 LAB — CBC WITH DIFFERENTIAL/PLATELET
Basophils Absolute: 0 10*3/uL (ref 0.0–0.2)
Basos: 1 %
EOS (ABSOLUTE): 0.1 10*3/uL (ref 0.0–0.4)
Eos: 3 %
Hematocrit: 38.3 % (ref 34.0–46.6)
Hemoglobin: 13.3 g/dL (ref 11.1–15.9)
Immature Grans (Abs): 0 10*3/uL (ref 0.0–0.1)
Immature Granulocytes: 0 %
Lymphocytes Absolute: 0.9 10*3/uL (ref 0.7–3.1)
Lymphs: 31 %
MCH: 29.3 pg (ref 26.6–33.0)
MCHC: 34.7 g/dL (ref 31.5–35.7)
MCV: 84 fL (ref 79–97)
Monocytes Absolute: 0.2 10*3/uL (ref 0.1–0.9)
Monocytes: 8 %
Neutrophils Absolute: 1.7 10*3/uL (ref 1.4–7.0)
Neutrophils: 57 %
Platelets: 73 10*3/uL — CL (ref 150–450)
RBC: 4.54 x10E6/uL (ref 3.77–5.28)
RDW: 12.4 % (ref 11.7–15.4)
WBC: 2.9 10*3/uL — ABNORMAL LOW (ref 3.4–10.8)

## 2022-08-12 LAB — LIPID PANEL
Chol/HDL Ratio: 2.8 ratio (ref 0.0–4.4)
Cholesterol, Total: 194 mg/dL (ref 100–199)
HDL: 70 mg/dL (ref >39)
LDL Chol Calc (NIH): 114 mg/dL — ABNORMAL HIGH (ref 0–99)
Triglycerides: 55 mg/dL (ref 0–149)
VLDL Cholesterol Cal: 10 mg/dL (ref 5–40)

## 2022-08-12 LAB — HEPATIC FUNCTION PANEL
ALT: 26 IU/L (ref 0–32)
AST: 30 IU/L (ref 0–40)
Albumin: 4.5 g/dL (ref 3.9–4.9)
Alkaline Phosphatase: 74 IU/L (ref 44–121)
Bilirubin Total: 0.9 mg/dL (ref 0.0–1.2)
Bilirubin, Direct: 0.22 mg/dL (ref 0.00–0.40)
Total Protein: 7.1 g/dL (ref 6.0–8.5)

## 2022-08-16 ENCOUNTER — Other Ambulatory Visit: Payer: Self-pay | Admitting: Family Medicine

## 2022-08-16 DIAGNOSIS — M85852 Other specified disorders of bone density and structure, left thigh: Secondary | ICD-10-CM

## 2022-08-16 DIAGNOSIS — Z9189 Other specified personal risk factors, not elsewhere classified: Secondary | ICD-10-CM

## 2022-08-16 NOTE — Progress Notes (Signed)
Chief Complaint  Patient presents with   Welcome To Medicare    Nonfasting Welcome to Medicare with pelvic. No new concerns.    Carol Thomas is a 66 y.o. female who presents for Welcome to Medicare visit and follow-up on chronic medical conditions.  See below for labs done prior to her visit.  Hypertension. She continues on amlodipine '10mg'$  and losartan-HCT 100-25.   Hasn't been checking BP often at home, 2 values brought in were from September 117/69 and 129/82. She occasionally has a mild headache. She denies dizziness, chest pain, palpitations, shortness of breath, edema (rare). Denies exertional chest pain or dyspnea.   Anxiety: She continues on citalopram '20mg'$ .  She still cares for her mother-in-law in her home (who had a stroke, with deficits in speech, balance, and has some dementia, which is worsening).  MIL goes 4 days/week to [employer] She has gotten worse.  Son and grandkids had been living with her.  He now stays at his girlfriend's, but his 2 kids are with her frequently, she never knows when, not given notice, last minute has to take them to school, etc.  Husband adds to the stress (the way he says things--she knows he is correct, needing to set boundaries, etc). She knows what she needs to do, but doesn't do it.  She doesn't want her grandkids to suffer. She doesn't want to change her medications.  Thrombocytopenia:  This was noted on August labs. She has had some very mild low values in the past, this time was much lower at 79. She was worried this could be from the rosuvastatin.  She opted to switch to pravastatin in August. She also had low WBC, also has had similar low readings in the past. On repeat CBC, platelets remained 79, but clumping was noted. WBC was normal. She is very concerned about the "clumping" and her platelets. She continues to deny and bleeding, bruising.  Hyperlipidemia: Lipids were significantly when on rosuvastatin '10mg'$  daily (LDL went from 143 to 68).  She didn't have side effects (had some myalgias at first which resolved when she started coenzyme Q10).  She switched from Rosuvastatin '10mg'$  to Pravastatin '40mg'$  in August, concerned that that statin could be affecting her platelets (pharmacist investigated this, only 1 case report, none with pravastatin).  Labs were repeated, see below. She continues to follow low cholesterol diet. She is asking to stop the statin.  She feels very tired, thinks related to platelet clumping, which she is connecting to the statin.  She has done a lot of internet research, and has concerns about bleeding, as  one of the many reasons she stated for wanting to stop the statin.  She isn't having any myalgias or bleeding.  EOE, GERD:  diagnosed/treated by Dr. Therisa Doyne.  She is taking PPI, no dysphagia. She reports having regurgitation sometimes after working out (doesn't eat prior), though better than previously.  Her reflux had initially resolved, but reports today still having some problems.  She reports being told to have f/u endoscopy in 1 year.  H/o Impaired fasting glucose:  Fasting sugar was 107 in October 2022, 105 in 01/2022. She tries to limit sugar/sweets/carbs. Has been eating some more chips recently.  Pasta is a weak spot for her, but hasn't been eating much recently.   H/o vitamin D deficiency: level was low at 19 in 2016.  Last check was normal at 49.8 in 07/2021, when taking 2000 IU daily.  She continues on same vitamins.   Osteopenia:  12/2021 DEXA showed T-2.3 R fem neck; FRAX 19.3/3.5%. Prior DEXA was in 2021--T-2.4 at L fem neck in 2021. FRAX was normal.   Alendronate was started in April. She reports tolerating this without side effects, no chest pain. She states that the timing of her heartburn issues isn't related to taking her alendronate on 'Sundays.  She takes a calcium supplement daily, along with separate vitamin D. She gets regular weight bearing exercise.  Immunization History  Administered Date(s)  Administered   Hepatitis A 08/16/2006   Hepatitis A, Adult 10/02/2006, 04/12/2016   Influenza Inj Mdck Quad Pf 08/01/2017   Influenza,inj,Quad PF,6+ Mos 10/10/2012, 08/19/2014, 09/08/2015, 10/18/2016, 08/08/2018, 07/24/2019, 08/02/2020, 08/11/2021, 06/08/2022   Influenza-Unspecified 08/16/2017   PFIZER Comirnaty(Gray Top)Covid-19 Tri-Sucrose Vaccine 02/02/2021   PFIZER(Purple Top)SARS-COV-2 Vaccination 12/21/2019, 01/21/2020, 08/24/2020   Pfizer Covid-19 Vaccine Bivalent Booster 12yrs & up 08/11/2021   Tdap 10/02/2006, 04/12/2016   Zoster Recombinat (Shingrix) 05/06/2018, 08/08/2018   Last Pap smear: 02/2014 Dr. Fontaine; s/p hysterectomy in 1997 Last mammogram: 02/2021 Last colonoscopy:  03/2022, 4 sessile serrated polyps (Dr. Karki). 3 year follow-up was recommended (per pt). She also had EGD 03/2022--duodenitis, esophagitis, and increased eosinophils noted (EOE) Last DEXA: 12/2021--T-2.3 R fem neck; FRAX 19.3/3.5%; prior was 11/2019 T-2.4 L fem neck, normal FRAX Dentist: twice yearly Ophtho: yearly Exercise:  She works out with personal trainer 2x/week, 45-60 mins. Combo of cardio and weights.   Also works in her yard, heavy lifting for chicken feed and pellets for stove. She walks 3+ miles at 17min miles  at the park at least 2-3x/week.  Patient Care Team: , , MD as PCP - General (Family Medicine) GI: Karki GYN: Fontaine (no longer sees) Derm: Dr. Gray Ophtho: [facility] on [address] Dentist: Dr. Ross  Depression Screening:     08/17/2022   10:02 AM 02/02/2021    1:45 PM 08/02/2020    1:40 PM 07/24/2019    2:54 PM 03/13/2019    8:47 AM  Depression screen PHQ 2/9  Decreased Interest 0 0 0 0 0  Down, Depressed, Hopeless 1 0 0 0 0  PHQ - 2 Score 1 0 0 0 0  Altered sleeping 2      Tired, decreased energy 2      Change in appetite 3      Feeling bad or failure about yourself  1      Trouble concentrating 0      Moving slowly or fidgety/restless 0      Suicidal  thoughts 0      PHQ-9 Score 9      Difficult doing work/chores Not difficult at all          08/17/2022    9:50 AM 07/24/2019    3:19 PM  GAD 7 : Generalized Anxiety Score  Nervous, Anxious, on Edge 0 1  Control/stop worrying 3 0  Worry too much - different things 3 3  Trouble relaxing 1 0  Restless 3 0  Easily annoyed or irritable 2 0  Afraid - awful might happen 0 0  Total GAD 7 Score 12 4  Anxiety Difficulty Somewhat difficult      Falls screen:     11'$ /11/2021    9:50 AM 02/08/2022   10:27 AM 02/02/2021    1:45 PM  Fall Risk   Falls in the past year? 0 1 0  Number falls in past yr: 0 0 0  Comment  02/01/22   Injury with Fall? 0 0  0  Comment  fell moving butcher block- fell backwards-buttbone hurt for a while   Risk for fall due to : No Fall Risks History of fall(s) No Fall Risks  Follow up Falls evaluation completed Falls evaluation completed Falls evaluation completed     Functional Status Survey: Is the patient deaf or have difficulty hearing?: Yes (some) Does the patient have difficulty seeing, even when wearing glasses/contacts?: No Does the patient have difficulty concentrating, remembering, or making decisions?: Yes (some memory) Does the patient have difficulty walking or climbing stairs?: No Does the patient have difficulty dressing or bathing?: No Does the patient have difficulty doing errands alone such as visiting a doctor's office or shopping?: No  Mild hearing loss--not significant enough for her to want to have it checked.  Thinks her husband speaks too quietly.  Mini-Cog Scoring: 5    End of Life Discussion:  Patient does not have a living will and medical power of attorney   PMH, PSH, SH and FH were reviewed and updated  Outpatient Encounter Medications as of 08/17/2022  Medication Sig Note   amLODipine (NORVASC) 10 MG tablet TAKE 1 TABLET DAILY    Calcium-Magnesium-Zinc (CA-MG-ZN PO) Take 1 tablet by mouth daily. 02/08/2022: 1-2 times a week    cholecalciferol (VITAMIN D) 1000 units tablet Take 2,000 Units by mouth daily.    co-enzyme Q-10 30 MG capsule Take 30 mg by mouth daily.    losartan-hydrochlorothiazide (HYZAAR) 100-25 MG tablet TAKE 1 TABLET DAILY    pantoprazole (PROTONIX) 40 MG tablet 1 tablet    rosuvastatin (CRESTOR) 10 MG tablet Take 1 tablet (10 mg total) by mouth daily.    [DISCONTINUED] alendronate (FOSAMAX) 70 MG tablet Take 1 tablet (70 mg total) by mouth every 7 (seven) days. Take with a full glass of water on an empty stomach.    [DISCONTINUED] citalopram (CELEXA) 20 MG tablet Take 1 tablet (20 mg total) by mouth daily.    [DISCONTINUED] pravastatin (PRAVACHOL) 40 MG tablet Take 1 tablet (40 mg total) by mouth daily.    alendronate (FOSAMAX) 70 MG tablet Take 1 tablet (70 mg total) by mouth every 7 (seven) days. Take with a full glass of water on an empty stomach.    citalopram (CELEXA) 20 MG tablet Take 1 tablet (20 mg total) by mouth daily.    No facility-administered encounter medications on file as of 08/17/2022.   She was taking pravastatin prior to today's visit  No Known Allergies  ROS:  The patient denies anorexia, fever, weight changes, decreased hearing, ear pain, sore throat, breast concerns, chest pain, palpitations, dizziness, syncope, dyspnea on exertion, cough, swelling, nausea, vomiting, diarrhea, constipation, abdominal pain, melena, hematochezia, hematuria, incontinence, dysuria, vaginal bleeding, discharge, odor or itch, genital lesions, joint pains, numbness, tingling, weakness, tremor, suspicious skin lesions, depression, abnormal bleeding/bruising, or enlarged lymph nodes.   Occasional headaches.  Only mild, intermittent snoring per pt. No known apnea, denies daytime somnolence. Feels refreshed in the mornings. Recently noting more fatigue. GERD symptoms per HPI. Occ cough from reflux. Sees dermatologist regularly (h/o melanoma).   PHYSICAL EXAM:  BP 138/82   Pulse 72   Ht '5\' 5"'$  (1.651  m)   Wt 172 lb (78 kg)   BMI 28.62 kg/m   Wt Readings from Last 3 Encounters:  08/17/22 172 lb (78 kg)  06/08/22 170 lb (77.1 kg)  02/08/22 165 lb 3.2 oz (74.9 kg)   General Appearance:    Alert, cooperative, no distress, appears stated age  Head:    Normocephalic, without obvious abnormality, atraumatic  Eyes:    PERRL, conjunctiva/corneas clear, EOM's intact, fundi benign  Ears:    Normal TM's and external ear canals  Nose:   No drainage or sinus tenderness  Throat:   Normal mucosa, no lesions  Neck:   Supple, no lymphadenopathy;  thyroid:  no enlargement/ tenderness/nodules; no carotid bruit or JVD  Back:    Spine nontender, no curvature, ROM normal, no CVA  tenderness  Lungs:     Clear to auscultation bilaterally without wheezes, rales or    ronchi; respirations unlabored  Chest Wall:    No tenderness or deformity   Heart:    Regular rate and rhythm, S1 and S2 normal, no murmur, rub   or gallop  Breast Exam:    No tenderness, masses, or nipple discharge or inversion.   No axillary lymphadenopathy  Abdomen:     Soft, non-tender, nondistended, normoactive bowel sounds,    no masses, no hepatosplenomegaly  Genitalia:    Normal external genitalia without lesions.  BUS and vagina normal. No abnormal vaginal discharge.  Uterus is surgically absent; adnexa not enlarged, nontender, no masses.   Rectal:    Normal tone, no masses or tenderness; guaiac negative stool  Extremities:   No clubbing, cyanosis or edema  Pulses:   2+ and symmetric all extremities  Skin:   Skin color, texture, turgor normal, no rashes or lesions  Lymph nodes:   Cervical, supraclavicular, inguinal and axillary nodes normal  Neurologic:   Normal strength, sensation and gait; reflexes 2+ and symmetric throughout                              Psych:   Normal mood, hygiene and grooming. She was more irritable today, somewhat flattened affect   Lab Results  Component Value Date   WBC 2.9 (L) 08/11/2022   HGB 13.3  08/11/2022   HCT 38.3 08/11/2022   MCV 84 08/11/2022   PLT 73 (LL) 08/11/2022   Lab Results  Component Value Date   CHOL 194 08/11/2022   HDL 70 08/11/2022   LDLCALC 114 (H) 08/11/2022   TRIG 55 08/11/2022   CHOLHDL 2.8 08/11/2022   Lab Results  Component Value Date   ALT 26 08/11/2022   AST 30 08/11/2022   ALKPHOS 74 08/11/2022   BILITOT 0.9 08/11/2022     ASSESSMENT/PLAN:  Welcome to Medicare preventive visit  Pure hypercholesterolemia - lipids not as good on pravastatin. Since plt unchanged (NOT related to statin), recommended switching back to Crestor '10mg'$  (tolerated, more effective) - Plan: rosuvastatin (CRESTOR) 10 MG tablet  Aortic atherosclerosis (HCC) - cont statin - Plan: rosuvastatin (CRESTOR) 10 MG tablet  Thrombocytopenia (HCC) - low the last 3 checks (poss clumping falsely lowering). No improvement with switch in statin. Refer to hematology. Pt reassured levels likely truly higher - Plan: Ambulatory referral to Hematology / Oncology  Essential (primary) hypertension - controlled per home numbers.  Borderline today but pt somewhat agitated  Generalized anxiety disorder - Increased stressors at home; declines increasing SSRI dose.  Strongly encouraged counseling (and to consider increase in dose) - Plan: citalopram (CELEXA) 20 MG tablet  Eosinophilic esophagitis - under care of GI, on PPI  Osteopenia of neck of left femur - cont alendronate, Ca, D, weight-bearing exercise - Plan: alendronate (FOSAMAX) 70 MG tablet  Fracture Risk Assessment Score (FRAX) indicating greater than 3% risk for hip  fracture - cont alendronate - Plan: alendronate (FOSAMAX) 70 MG tablet  Need for pneumococcal 20-valent conjugate vaccination - Plan: Pneumococcal conjugate vaccine 20-valent (Prevnar 20)  Need for COVID-19 vaccine - Plan: [place] Fall 2023 Covid-19 Vaccine 72yr and older  Discussed monthly self breast exams and yearly mammograms; at least 30 minutes of aerobic activity  at least 5 days/week and weight-bearing exercise at least 2-3 days/week; proper sunscreen use reviewed; healthy diet, including goals of calcium and vitamin D intake and alcohol recommendations (less than or equal to 1 drink/day) reviewed; regular seatbelt use; changing batteries in smoke detectors.  Immunization recommendations discussed--continue yearly flu shots (now high dose); [id number] and bivalent COVID booster given today.  RSV vaccine recommended from pharmacy. Colonoscopy recommendations reviewed, UTD, due 03/2025.  MOST form reviewed, completed. Full code, full care. Discussed living will and healthcare power of attorney, forms given. Asked for copies when completed, to be scanned into medical chart.    F/u 6 mos med check, sooner as needed.  Significant additional time spent counseling and answering questions (pt very frustrated, didn't want to be on statin, expressing many concerns about the meds, her platelets, her fatigue, and the research she has done (though I cut her short on this, letting her discuss these platelet concerns with the hematologist), and her anxiety. She had been very resistant to starting statins in the first place, looking for reason to stop.  Has aortic atherosclerosis and hyperlipidemia as indications). Spent 70 min FTF, additional 15 mins reviewing records prior to visit, documentation.   Medicare Attestation I have personally reviewed: The patient's medical and social history Their use of alcohol, tobacco or illicit drugs Their current medications and supplements The patient's functional ability including ADLs,fall risks, home safety risks, cognitive, and hearing and visual impairment Diet and physical activities Evidence for depression or mood disorders  The patient's weight, height, BMI have been recorded in the chart.  I have made referrals, counseling, and provided education to the patient based on review of the above and I have provided the patient  with a written personalized care plan for preventive services.     EVikki Ports MD

## 2022-08-16 NOTE — Patient Instructions (Incomplete)
HEALTH MAINTENANCE RECOMMENDATIONS:  It is recommended that you get at least 30 minutes of aerobic exercise at least 5 days/week (for weight loss, you may need as much as 60-90 minutes). This can be any activity that gets your heart rate up. This can be divided in 10-15 minute intervals if needed, but try and build up your endurance at least once a week.  Weight bearing exercise is also recommended twice weekly.  Eat a healthy diet with lots of vegetables, fruits and fiber.  "Colorful" foods have a lot of vitamins (ie green vegetables, tomatoes, red peppers, etc).  Limit sweet tea, regular sodas and alcoholic beverages, all of which has a lot of calories and sugar.  Up to 1 alcoholic drink daily may be beneficial for women (unless trying to lose weight, watch sugars).  Drink a lot of water.  Calcium recommendations are 1200-1500 mg daily (1500 mg for postmenopausal women or women without ovaries), and vitamin D 1000 IU daily.  This should be obtained from diet and/or supplements (vitamins), and calcium should not be taken all at once, but in divided doses.  Monthly self breast exams and yearly mammograms for women over the age of 62 is recommended.  Sunscreen of at least SPF 30 should be used on all sun-exposed parts of the skin when outside between the hours of 10 am and 4 pm (not just when at beach or pool, but even with exercise, golf, tennis, and yard work!)  Use a sunscreen that says "broad spectrum" so it covers both UVA and UVB rays, and make sure to reapply every 1-2 hours.  Remember to change the batteries in your smoke detectors when changing your clock times in the spring and fall. Carbon monoxide detectors are recommended for your home.  Use your seat belt every time you are in a car, and please drive safely and not be distracted with cell phones and texting while driving.   Carol Thomas , Thank you for taking time to come for your Welcome to Medicare Visit. I appreciate your ongoing  commitment to your health goals. Please review the following plan we discussed and let me know if I can assist you in the future.   This is a list of the screening recommended for you and due dates:  Health Maintenance  Topic Date Due   Medicare Annual Wellness Visit  Never done   HIV Screening  Never done   COVID-19 Vaccine (6 - Pfizer risk series) 10/06/2021   Pneumonia Vaccine (1 - PCV) Never done   Mammogram  03/03/2023   Tetanus Vaccine  04/12/2026   Colon Cancer Screening  04/03/2032   Flu Shot  Completed   DEXA scan (bone density measurement)  01/15/2024   Hepatitis C Screening: USPSTF Recommendation to screen - Ages 18-79 yo.  Completed   Zoster (Shingles) Vaccine  Completed   HPV Vaccine  Aged Out   I recommend getting the RSV vaccine. You need to get this from the pharmacy. Separate it from other vaccines by 2 weeks.  Schedule your mammogram.  We are referring you to a hematologist to discuss your platelets.  Your LDL cholesterol isn't as good as with the rosuvastatin, so I'm switching you back to that (since it isn't likely what is affecting your platelets).  Please bring [place] copies of your Living Will and [place] once completed and notarized so that it can be scanned into your medical chart.  I encourage you to get counseling, given the  significant stressors at home. Consider increasing the citalopram dose as well, to help with your significant symptoms (depression/anxiety), especially if getting worse or unable to get help (therapy).  [facility] and [facility] are all [facility] options for counseling, vs checking with your insurance to find a different therapist.

## 2022-08-17 ENCOUNTER — Ambulatory Visit (INDEPENDENT_AMBULATORY_CARE_PROVIDER_SITE_OTHER): Payer: Medicare Other | Admitting: Family Medicine

## 2022-08-17 ENCOUNTER — Encounter: Payer: Self-pay | Admitting: Family Medicine

## 2022-08-17 VITALS — BP 138/82 | HR 72 | Ht 65.0 in | Wt 172.0 lb

## 2022-08-17 DIAGNOSIS — E7800 Pure hypercholesterolemia, unspecified: Secondary | ICD-10-CM | POA: Diagnosis not present

## 2022-08-17 DIAGNOSIS — Z23 Encounter for immunization: Secondary | ICD-10-CM

## 2022-08-17 DIAGNOSIS — I700 Atherosclerosis of aorta: Secondary | ICD-10-CM | POA: Diagnosis not present

## 2022-08-17 DIAGNOSIS — I10 Essential (primary) hypertension: Secondary | ICD-10-CM | POA: Diagnosis not present

## 2022-08-17 DIAGNOSIS — Z9189 Other specified personal risk factors, not elsewhere classified: Secondary | ICD-10-CM

## 2022-08-17 DIAGNOSIS — M85852 Other specified disorders of bone density and structure, left thigh: Secondary | ICD-10-CM

## 2022-08-17 DIAGNOSIS — D696 Thrombocytopenia, unspecified: Secondary | ICD-10-CM

## 2022-08-17 DIAGNOSIS — Z0000 Encounter for general adult medical examination without abnormal findings: Secondary | ICD-10-CM | POA: Diagnosis not present

## 2022-08-17 DIAGNOSIS — K200 Eosinophilic esophagitis: Secondary | ICD-10-CM

## 2022-08-17 DIAGNOSIS — F411 Generalized anxiety disorder: Secondary | ICD-10-CM

## 2022-08-17 DIAGNOSIS — F419 Anxiety disorder, unspecified: Secondary | ICD-10-CM

## 2022-08-17 MED ORDER — ALENDRONATE SODIUM 70 MG PO TABS: 70.0000 mg | ORAL_TABLET | ORAL | 3 refills | Status: DC

## 2022-08-17 MED ORDER — ROSUVASTATIN CALCIUM 10 MG PO TABS: 10.0000 mg | ORAL_TABLET | Freq: Every day | ORAL | 3 refills | Status: DC

## 2022-08-17 MED ORDER — CITALOPRAM HYDROBROMIDE 20 MG PO TABS: 20.0000 mg | ORAL_TABLET | Freq: Every day | ORAL | 3 refills | Status: DC

## 2022-08-18 ENCOUNTER — Encounter: Payer: Self-pay | Admitting: Family Medicine

## 2022-08-22 ENCOUNTER — Other Ambulatory Visit: Payer: Self-pay | Admitting: Nurse Practitioner

## 2022-08-22 DIAGNOSIS — D696 Thrombocytopenia, unspecified: Secondary | ICD-10-CM

## 2022-08-22 NOTE — Progress Notes (Unsigned)
New Hematology/Oncology Consult   Requesting MD: Dr. Rita Ohara  [phone]      Reason for Consult: Thrombocytopenia  HPI: Carol Thomas is a 66 year old woman referred for evaluation of thrombocytopenia.  She saw Dr. Tomi Bamberger on 08/17/2022 for a routine visit.  CBC done on 08/11/2022 in anticipation of that visit returned with a hemoglobin of 13.3, white count 2.9, normal differential, platelet count 73,000.  Comparison CBCs-06/08/2022 white count 4.4, normal differential, platelet count 79,000; 06/02/2022 white count 3.0, normal differential, platelet count 79,000; 08/11/2021 total white count 4.1, platelet count 169,000; 11/29/2020 white count 3.1, normal differential, platelet count 140,000; 07/28/2020 total white count normal, platelet count 135,000.  Most remote CBC is from 08/25/2014-hemoglobin 13.2, white count 3.1, platelet count 141,000.  Of note, on the CBC from 06/08/2022 there is a comment regarding aggregation of platelets.   Past Medical History:  Diagnosis Date   Adenomatous colon polyp 11/2008   Dr. Penelope Coop at [facility]) 1998   Melanoma-Left leg   Hypertension    Osteopenia 10/2012   T score of -2.2 FRAX 8%/1.1%   Vitamin D deficiency 10/2012   vitamin D level 10  :   Past Surgical History:  Procedure Laterality Date   APPENDECTOMY     ESOPHAGOGASTRODUODENOSCOPY  05/24/2017   Erythema in the middle third of the esphagus and in lower third.-biopised erythematous mucosa in the antrum, normal examined duodenum   Melanoma excised     TOTAL VAGINAL HYSTERECTOMY  1997   TAH   TUBAL LIGATION     UPPER GI ENDOSCOPY  04/03/2022   Dr. Ashok Croon gastritis, possible eosinophilic esophagitis     Current Outpatient Medications:    alendronate (FOSAMAX) 70 MG tablet, Take 1 tablet (70 mg total) by mouth every 7 (seven) days. Take with a full glass of water on an empty stomach., Disp: 12 tablet, Rfl: 3   amLODipine (NORVASC) 10 MG tablet, TAKE 1 TABLET  DAILY, Disp: 90 tablet, Rfl: 1   Calcium-Magnesium-Zinc (CA-MG-ZN PO), Take 1 tablet by mouth daily., Disp: , Rfl:    cholecalciferol (VITAMIN D) 1000 units tablet, Take 2,000 Units by mouth daily., Disp: , Rfl:    citalopram (CELEXA) 20 MG tablet, Take 1 tablet (20 mg total) by mouth daily., Disp: 90 tablet, Rfl: 3   co-enzyme Q-10 30 MG capsule, Take 30 mg by mouth daily., Disp: , Rfl:    losartan-hydrochlorothiazide (HYZAAR) 100-25 MG tablet, TAKE 1 TABLET DAILY, Disp: 90 tablet, Rfl: 1   pantoprazole (PROTONIX) 40 MG tablet, 1 tablet, Disp: , Rfl:    rosuvastatin (CRESTOR) 10 MG tablet, Take 1 tablet (10 mg total) by mouth daily., Disp: 90 tablet, Rfl: 3:    No Known Allergies:  FH:  SOCIAL HISTORY:  Review of Systems:  Physical Exam:  There were no vitals taken for this visit.  HEENT: *** Lungs: *** Cardiac: *** Abdomen: *** GU: ***  Vascular: *** Lymph nodes: *** Neurologic: *** Skin: *** Musculoskeletal: ***  LABS:  No results for input(s): "WBC", "HGB", "HCT", "PLT" in the last 72 hours.  No results for input(s): "NA", "K", "CL", "CO2", "GLUCOSE", "BUN", "CREATININE", "CALCIUM" in the last 72 hours.    RADIOLOGY:  No results found.  Assessment and Plan:   ***    Ned Card, NP 08/22/2022, 2:38 PM

## 2022-08-23 ENCOUNTER — Encounter: Payer: Self-pay | Admitting: Nurse Practitioner

## 2022-08-23 ENCOUNTER — Inpatient Hospital Stay (HOSPITAL_BASED_OUTPATIENT_CLINIC_OR_DEPARTMENT_OTHER): Payer: Medicare Other | Admitting: Nurse Practitioner

## 2022-08-23 ENCOUNTER — Telehealth: Payer: Self-pay | Admitting: Family Medicine

## 2022-08-23 ENCOUNTER — Inpatient Hospital Stay: Payer: Medicare Other | Attending: Nurse Practitioner

## 2022-08-23 ENCOUNTER — Other Ambulatory Visit (HOSPITAL_BASED_OUTPATIENT_CLINIC_OR_DEPARTMENT_OTHER): Payer: Self-pay

## 2022-08-23 VITALS — BP 146/76 | HR 65 | Temp 98.2°F | Resp 18 | Ht 65.0 in | Wt 175.0 lb

## 2022-08-23 DIAGNOSIS — D72819 Decreased white blood cell count, unspecified: Secondary | ICD-10-CM | POA: Insufficient documentation

## 2022-08-23 DIAGNOSIS — F419 Anxiety disorder, unspecified: Secondary | ICD-10-CM | POA: Diagnosis not present

## 2022-08-23 DIAGNOSIS — K200 Eosinophilic esophagitis: Secondary | ICD-10-CM | POA: Insufficient documentation

## 2022-08-23 DIAGNOSIS — D696 Thrombocytopenia, unspecified: Secondary | ICD-10-CM | POA: Diagnosis present

## 2022-08-23 DIAGNOSIS — I10 Essential (primary) hypertension: Secondary | ICD-10-CM | POA: Insufficient documentation

## 2022-08-23 DIAGNOSIS — Z79899 Other long term (current) drug therapy: Secondary | ICD-10-CM | POA: Diagnosis not present

## 2022-08-23 DIAGNOSIS — Z8719 Personal history of other diseases of the digestive system: Secondary | ICD-10-CM | POA: Insufficient documentation

## 2022-08-23 DIAGNOSIS — Z85820 Personal history of malignant melanoma of skin: Secondary | ICD-10-CM | POA: Diagnosis not present

## 2022-08-23 DIAGNOSIS — E7800 Pure hypercholesterolemia, unspecified: Secondary | ICD-10-CM | POA: Diagnosis not present

## 2022-08-23 LAB — CBC WITH DIFFERENTIAL (CANCER CENTER ONLY)
Abs Immature Granulocytes: 0.01 10*3/uL (ref 0.00–0.07)
Basophils Absolute: 0 10*3/uL (ref 0.0–0.1)
Basophils Relative: 1 %
Eosinophils Absolute: 0.1 10*3/uL (ref 0.0–0.5)
Eosinophils Relative: 2 %
HCT: 36.5 % (ref 36.0–46.0)
Hemoglobin: 12.4 g/dL (ref 12.0–15.0)
Immature Granulocytes: 0 %
Lymphocytes Relative: 25 %
Lymphs Abs: 0.7 10*3/uL (ref 0.7–4.0)
MCH: 29.5 pg (ref 26.0–34.0)
MCHC: 34 g/dL (ref 30.0–36.0)
MCV: 86.9 fL (ref 80.0–100.0)
Monocytes Absolute: 0.2 10*3/uL (ref 0.1–1.0)
Monocytes Relative: 8 %
Neutro Abs: 1.9 10*3/uL (ref 1.7–7.7)
Neutrophils Relative %: 64 %
Platelet Count: 64 10*3/uL — ABNORMAL LOW (ref 150–400)
RBC: 4.2 MIL/uL (ref 3.87–5.11)
RDW: 13.2 % (ref 11.5–15.5)
Smear Review: NORMAL
WBC Count: 3 10*3/uL — ABNORMAL LOW (ref 4.0–10.5)
nRBC: 0 % (ref 0.0–0.2)

## 2022-08-23 LAB — CMP (CANCER CENTER ONLY)
ALT: 21 U/L (ref 0–44)
AST: 22 U/L (ref 15–41)
Albumin: 4.4 g/dL (ref 3.5–5.0)
Alkaline Phosphatase: 55 U/L (ref 38–126)
Anion gap: 9 (ref 5–15)
BUN: 20 mg/dL (ref 8–23)
CO2: 28 mmol/L (ref 22–32)
Calcium: 10 mg/dL (ref 8.9–10.3)
Chloride: 100 mmol/L (ref 98–111)
Creatinine: 1.24 mg/dL — ABNORMAL HIGH (ref 0.44–1.00)
GFR, Estimated: 48 mL/min — ABNORMAL LOW (ref >60)
Glucose, Bld: 86 mg/dL (ref 70–99)
Potassium: 4.1 mmol/L (ref 3.5–5.1)
Sodium: 137 mmol/L (ref 135–145)
Total Bilirubin: 0.8 mg/dL (ref 0.3–1.2)
Total Protein: 7.2 g/dL (ref 6.5–8.1)

## 2022-08-23 LAB — LACTATE DEHYDROGENASE: LDH: 183 U/L (ref 98–192)

## 2022-08-23 LAB — VITAMIN B12: Vitamin B-12: 214 pg/mL (ref 180–914)

## 2022-08-23 LAB — SAVE SMEAR(SSMR), FOR PROVIDER SLIDE REVIEW

## 2022-08-23 NOTE — Telephone Encounter (Signed)
Patient advised.

## 2022-08-23 NOTE — Telephone Encounter (Signed)
Advise pt it was her pneumonia vaccine.  This sounds like a local reaction. She may use ibuprofen or tylenol. She can ice the area. She can take an antihistamine also (if rash, itchy, swollen)

## 2022-08-23 NOTE — Telephone Encounter (Signed)
Pt called have a reaction on her right arm from recent pneumo or covid vaccine. Bruised around arm and red patchy.  Hurts to lift right arm.  Please call pt [phone]

## 2022-08-23 NOTE — Telephone Encounter (Signed)
Her pneumonia vaccine was given to her in her right arm.

## 2022-08-24 ENCOUNTER — Other Ambulatory Visit: Payer: Self-pay | Admitting: Nurse Practitioner

## 2022-08-24 DIAGNOSIS — D696 Thrombocytopenia, unspecified: Secondary | ICD-10-CM

## 2022-08-24 LAB — KAPPA/LAMBDA LIGHT CHAINS
Kappa free light chain: 25.9 mg/L — ABNORMAL HIGH (ref 3.3–19.4)
Kappa, lambda light chain ratio: 0.81 (ref 0.26–1.65)
Lambda free light chains: 31.9 mg/L — ABNORMAL HIGH (ref 5.7–26.3)

## 2022-08-28 LAB — MULTIPLE MYELOMA PANEL, SERUM
Albumin SerPl Elph-Mcnc: 3.9 g/dL (ref 2.9–4.4)
Albumin/Glob SerPl: 1.4 (ref 0.7–1.7)
Alpha 1: 0.2 g/dL (ref 0.0–0.4)
Alpha2 Glob SerPl Elph-Mcnc: 0.6 g/dL (ref 0.4–1.0)
B-Globulin SerPl Elph-Mcnc: 0.8 g/dL (ref 0.7–1.3)
Gamma Glob SerPl Elph-Mcnc: 1.2 g/dL (ref 0.4–1.8)
Globulin, Total: 2.9 g/dL (ref 2.2–3.9)
IgA: 194 mg/dL (ref 87–352)
IgG (Immunoglobin G), Serum: 946 mg/dL (ref 586–1602)
IgM (Immunoglobulin M), Srm: 493 mg/dL — ABNORMAL HIGH (ref 26–217)
Total Protein ELP: 6.8 g/dL (ref 6.0–8.5)

## 2022-09-04 ENCOUNTER — Encounter: Payer: Self-pay | Admitting: Nurse Practitioner

## 2022-09-05 ENCOUNTER — Encounter: Payer: Self-pay | Admitting: *Deleted

## 2022-09-05 ENCOUNTER — Other Ambulatory Visit: Payer: Self-pay | Admitting: *Deleted

## 2022-09-05 DIAGNOSIS — D696 Thrombocytopenia, unspecified: Secondary | ICD-10-CM

## 2022-09-05 NOTE — Progress Notes (Signed)
Dr. Benay Spice reviewed her recent labs with the following interpretation: Labs show no evidence of myeloma or lymphoma There is evidence of inflammation Will add ANA and ESR to next lab Follow up as scheduled. Patient notified of via MyChart message.

## 2022-09-20 ENCOUNTER — Inpatient Hospital Stay (HOSPITAL_BASED_OUTPATIENT_CLINIC_OR_DEPARTMENT_OTHER): Payer: Medicare Other | Admitting: Oncology

## 2022-09-20 ENCOUNTER — Observation Stay (HOSPITAL_COMMUNITY)
Admission: AD | Admit: 2022-09-20 | Discharge: 2022-09-21 | Disposition: A | Discharge status: Home / Self Care | Payer: Medicare Other | Source: Ambulatory Visit | Attending: Internal Medicine | Admitting: Internal Medicine

## 2022-09-20 ENCOUNTER — Encounter (HOSPITAL_COMMUNITY): Payer: Self-pay

## 2022-09-20 ENCOUNTER — Encounter: Payer: Self-pay | Admitting: Oncology

## 2022-09-20 ENCOUNTER — Encounter (HOSPITAL_COMMUNITY): Payer: Self-pay | Admitting: Internal Medicine

## 2022-09-20 ENCOUNTER — Other Ambulatory Visit: Payer: Self-pay | Admitting: *Deleted

## 2022-09-20 ENCOUNTER — Inpatient Hospital Stay: Payer: Medicare Other | Attending: Nurse Practitioner

## 2022-09-20 ENCOUNTER — Other Ambulatory Visit: Payer: Self-pay

## 2022-09-20 VITALS — BP 132/82 | HR 69 | Temp 98.1°F | Resp 18 | Wt 173.2 lb

## 2022-09-20 DIAGNOSIS — Z1379 Encounter for other screening for genetic and chromosomal anomalies: Secondary | ICD-10-CM | POA: Diagnosis not present

## 2022-09-20 DIAGNOSIS — I10 Essential (primary) hypertension: Secondary | ICD-10-CM | POA: Insufficient documentation

## 2022-09-20 DIAGNOSIS — F419 Anxiety disorder, unspecified: Secondary | ICD-10-CM | POA: Insufficient documentation

## 2022-09-20 DIAGNOSIS — K209 Esophagitis, unspecified without bleeding: Secondary | ICD-10-CM | POA: Diagnosis not present

## 2022-09-20 DIAGNOSIS — E7800 Pure hypercholesterolemia, unspecified: Secondary | ICD-10-CM | POA: Insufficient documentation

## 2022-09-20 DIAGNOSIS — D696 Thrombocytopenia, unspecified: Secondary | ICD-10-CM

## 2022-09-20 DIAGNOSIS — Z79899 Other long term (current) drug therapy: Secondary | ICD-10-CM | POA: Insufficient documentation

## 2022-09-20 DIAGNOSIS — Z85820 Personal history of malignant melanoma of skin: Secondary | ICD-10-CM | POA: Insufficient documentation

## 2022-09-20 DIAGNOSIS — D890 Polyclonal hypergammaglobulinemia: Secondary | ICD-10-CM | POA: Insufficient documentation

## 2022-09-20 DIAGNOSIS — K200 Eosinophilic esophagitis: Secondary | ICD-10-CM | POA: Insufficient documentation

## 2022-09-20 DIAGNOSIS — R768 Other specified abnormal immunological findings in serum: Secondary | ICD-10-CM | POA: Insufficient documentation

## 2022-09-20 DIAGNOSIS — Z8719 Personal history of other diseases of the digestive system: Secondary | ICD-10-CM | POA: Insufficient documentation

## 2022-09-20 DIAGNOSIS — E559 Vitamin D deficiency, unspecified: Secondary | ICD-10-CM | POA: Insufficient documentation

## 2022-09-20 DIAGNOSIS — Z85828 Personal history of other malignant neoplasm of skin: Secondary | ICD-10-CM | POA: Insufficient documentation

## 2022-09-20 DIAGNOSIS — D72819 Decreased white blood cell count, unspecified: Secondary | ICD-10-CM | POA: Insufficient documentation

## 2022-09-20 LAB — CBC WITH DIFFERENTIAL (CANCER CENTER ONLY)
Abs Immature Granulocytes: 0.01 10*3/uL (ref 0.00–0.07)
Basophils Absolute: 0 10*3/uL (ref 0.0–0.1)
Basophils Relative: 1 %
Eosinophils Absolute: 0.1 10*3/uL (ref 0.0–0.5)
Eosinophils Relative: 3 %
HCT: 37.1 % (ref 36.0–46.0)
Hemoglobin: 12.8 g/dL (ref 12.0–15.0)
Immature Granulocytes: 0 %
Lymphocytes Relative: 24 %
Lymphs Abs: 0.8 10*3/uL (ref 0.7–4.0)
MCH: 29 pg (ref 26.0–34.0)
MCHC: 34.5 g/dL (ref 30.0–36.0)
MCV: 84.1 fL (ref 80.0–100.0)
Monocytes Absolute: 0.3 10*3/uL (ref 0.1–1.0)
Monocytes Relative: 9 %
Neutro Abs: 2 10*3/uL (ref 1.7–7.7)
Neutrophils Relative %: 63 %
Platelet Count: 16 10*3/uL — ABNORMAL LOW (ref 150–400)
RBC: 4.41 MIL/uL (ref 3.87–5.11)
RDW: 13.2 % (ref 11.5–15.5)
WBC Count: 3.1 10*3/uL — ABNORMAL LOW (ref 4.0–10.5)
nRBC: 0 % (ref 0.0–0.2)

## 2022-09-20 LAB — SEDIMENTATION RATE: Sed Rate: 31 mm/hr — ABNORMAL HIGH (ref 0–22)

## 2022-09-20 LAB — SAVE SMEAR(SSMR), FOR PROVIDER SLIDE REVIEW

## 2022-09-20 LAB — HIV ANTIBODY (ROUTINE TESTING W REFLEX): HIV Screen 4th Generation wRfx: NONREACTIVE

## 2022-09-20 MED ORDER — HYDROCHLOROTHIAZIDE 25 MG PO TABS
25.0000 mg | ORAL_TABLET | Freq: Every day | ORAL | Status: DC
  Administered 2022-09-21: 25 mg via ORAL
  Filled 2022-09-20: qty 1

## 2022-09-20 MED ORDER — LOSARTAN POTASSIUM-HCTZ 100-25 MG PO TABS: 1.0000 | ORAL_TABLET | Freq: Every day | ORAL | Status: DC

## 2022-09-20 MED ORDER — LOSARTAN POTASSIUM 50 MG PO TABS
100.0000 mg | ORAL_TABLET | Freq: Every day | ORAL | Status: DC
  Administered 2022-09-21: 100 mg via ORAL
  Filled 2022-09-20: qty 2

## 2022-09-20 MED ORDER — ACETAMINOPHEN 325 MG PO TABS
650.0000 mg | ORAL_TABLET | Freq: Four times a day (QID) | ORAL | Status: DC | PRN
  Administered 2022-09-21: 650 mg via ORAL
  Filled 2022-09-20: qty 2

## 2022-09-20 MED ORDER — ACETAMINOPHEN 650 MG RE SUPP: 650.0000 mg | Freq: Four times a day (QID) | RECTAL | Status: DC | PRN

## 2022-09-20 MED ORDER — PANTOPRAZOLE SODIUM 40 MG PO TBEC
40.0000 mg | DELAYED_RELEASE_TABLET | Freq: Every day | ORAL | Status: DC
  Administered 2022-09-21: 40 mg via ORAL
  Filled 2022-09-20: qty 1

## 2022-09-20 MED ORDER — ROSUVASTATIN CALCIUM 10 MG PO TABS
10.0000 mg | ORAL_TABLET | Freq: Every day | ORAL | Status: DC
  Administered 2022-09-21: 10 mg via ORAL
  Filled 2022-09-20: qty 1

## 2022-09-20 MED ORDER — AMLODIPINE BESYLATE 10 MG PO TABS
10.0000 mg | ORAL_TABLET | Freq: Every day | ORAL | Status: DC
  Administered 2022-09-21: 10 mg via ORAL
  Filled 2022-09-20: qty 1

## 2022-09-20 MED ORDER — VITAMIN D 25 MCG (1000 UNIT) PO TABS
2000.0000 [IU] | ORAL_TABLET | Freq: Every day | ORAL | Status: DC
  Administered 2022-09-21: 2000 [IU] via ORAL
  Filled 2022-09-20: qty 2

## 2022-09-20 MED ORDER — PREDNISONE 20 MG PO TABS: 60.0000 mg | ORAL_TABLET | Freq: Every day | ORAL | 0 refills | Status: DC

## 2022-09-20 MED ORDER — METHYLPREDNISOLONE SODIUM SUCC 125 MG IJ SOLR
80.0000 mg | Freq: Every day | INTRAMUSCULAR | Status: DC
  Administered 2022-09-20 – 2022-09-21 (×2): 80 mg via INTRAVENOUS
  Filled 2022-09-20 (×2): qty 2

## 2022-09-20 MED ORDER — CITALOPRAM HYDROBROMIDE 20 MG PO TABS
20.0000 mg | ORAL_TABLET | Freq: Every day | ORAL | Status: DC
  Administered 2022-09-21: 20 mg via ORAL
  Filled 2022-09-20: qty 1

## 2022-09-20 NOTE — H&P (Signed)
History and Physical    Carol Thomas DOB: [birth year] DOA: 09/20/2022  PCP: Rita Ohara, MD  Patient coming from: Home by oncology clinic  I have personally briefly reviewed patient's old medical records available.   Chief Complaint: Low platelets  HPI: Carol Thomas is a 66 y.o. female with medical history significant of essential hypertension, hyperlipidemia, GERD , has history of melanoma resected in 1998, vitamin D deficiency who was recently diagnosed with thrombocytopenia and followed up at oncology clinic was sent from oncology clinic today because of significant drop in platelets for expedited work-up. Patient with above history.  She recently had diagnosed with atherosclerosis and started on statins, a routine work-up about 3 months ago revealed platelet count of 79 that was under surveillance.  She also was found with low WBC count.  Patient does not have any acute symptoms except she feels more fatigued and tired these days, however she is also taking care of mother-in-law who has dementia and agitation.  There is no evidence of active bleeding. Last month or so, patient started noticing some bruising on her arms as well as started noticing some rashes on her skin.   Today at oncology clinic patient is hemodynamically stable.  She drove to the clinic for consultation with Dr. Benay Spice.  Sometimes gets headache and fatigue as above but denies any other symptoms.  Denies any gum bleeding, denies any rectal bleeding or blood in urine.  Weight has been stable.  Appetite has been fair.  Denies any abdominal pain nausea or vomiting.  There was count 3.1, platelets 16.  With these findings.  Patient was sent to hospital for work-up including bone marrow biopsy.  Review of Systems: all systems are reviewed and pertinent positive as per HPI otherwise rest are negative.    Past Medical History:  Diagnosis Date   Adenomatous colon polyp 11/2008   Dr. Penelope Coop at [facility]) 1998   Melanoma-Left leg   Hypertension    Osteopenia 10/2012   T score of -2.2 FRAX 8%/1.1%   Vitamin D deficiency 10/2012   vitamin D level 10    Past Surgical History:  Procedure Laterality Date   APPENDECTOMY     ESOPHAGOGASTRODUODENOSCOPY  05/24/2017   Erythema in the middle third of the esphagus and in lower third.-biopised erythematous mucosa in the antrum, normal examined duodenum   Melanoma excised     TOTAL VAGINAL HYSTERECTOMY  1997   TAH   TUBAL LIGATION     UPPER GI ENDOSCOPY  04/03/2022   Dr. Ashok Croon gastritis, possible eosinophilic esophagitis    Social history   reports that she has never smoked. She has never used smokeless tobacco. She reports that she does not drink alcohol and does not use drugs.  No Known Allergies  Family History  Problem Relation Age of Onset   Hypertension Mother    Diabetes Mother    Osteoporosis Mother    Thyroid disease Mother    COPD Mother    Hyperlipidemia Mother    Heart attack Mother 48   Stroke Daughter        during pregnancy   Depression Daughter    Bipolar disorder Son    Hyperlipidemia Sister    Colon cancer Maternal Uncle        40's     Prior to Admission medications   Medication Sig Start Date End Date Taking? Authorizing Provider  alendronate (FOSAMAX) 70 MG tablet Take  1 tablet (70 mg total) by mouth every 7 (seven) days. Take with a full glass of water on an empty stomach. 08/17/22   Rita Ohara, MD  amLODipine (NORVASC) 10 MG tablet TAKE 1 TABLET DAILY 08/10/22   Rita Ohara, MD  Calcium-Magnesium-Zinc (CA-MG-ZN PO) Take 1 tablet by mouth daily.    [provider]  cholecalciferol (VITAMIN D) 1000 units tablet Take 2,000 Units by mouth daily.    [provider]  citalopram (CELEXA) 20 MG tablet Take 1 tablet (20 mg total) by mouth daily. 08/17/22   Rita Ohara, MD  co-enzyme Q-10 30 MG capsule Take 30 mg by mouth daily.    [provider]   losartan-hydrochlorothiazide [facility]) 100-25 MG tablet TAKE 1 TABLET DAILY 08/10/22   Rita Ohara, MD  pantoprazole (PROTONIX) 40 MG tablet 1 tablet 06/13/17   [provider]  predniSONE (DELTASONE) 20 MG tablet Take 3 tablets (60 mg total) by mouth daily with breakfast. 09/20/22   Ladell Pier, MD  rosuvastatin (CRESTOR) 10 MG tablet Take 1 tablet (10 mg total) by mouth daily. 08/17/22   Rita Ohara, MD    Physical Exam: Vitals:   09/20/22 1633  BP: (!) 147/75  Pulse: (!) 57  Resp: 18  Temp: 97.9 F (36.6 C)  TempSrc: Oral  SpO2: 100%    Constitutional: NAD, calm, comfortable Vitals:   09/20/22 1633  BP: (!) 147/75  Pulse: (!) 57  Resp: 18  Temp: 97.9 F (36.6 C)  TempSrc: Oral  SpO2: 100%   Eyes: PERRL, lids and conjunctivae normal ENMT: Mucous membranes are moist. Posterior pharynx clear of any exudate or lesions.Normal dentition.  Neck: normal, supple, no masses, no thyromegaly Respiratory: clear to auscultation bilaterally, no wheezing, no crackles. Normal respiratory effort. No accessory muscle use.  Cardiovascular: Regular rate and rhythm, no murmurs / rubs / gallops. No extremity edema. 2+ pedal pulses. No carotid bruits.  Abdomen: no tenderness, no masses palpated. No hepatosplenomegaly. Bowel sounds positive.  Musculoskeletal: no clubbing / cyanosis. No joint deformity upper and lower extremities. Good ROM, no contractures. Normal muscle tone.  Skin: no rashes, lesions, ulcers. No induration Neurologic: CN 2-12 grossly intact. Sensation intact, DTR normal. Strength 5/5 in all 4.  Psychiatric: Normal judgment and insight. Alert and oriented x 3. Normal mood.   Patient does have some petechial rashes on her extensor surfaces of the upper extremity and both lower extremities.  More prominent on the extensor surface.  No evidence of bleeding.   Labs on Admission: I have personally reviewed following labs and imaging studies  CBC: Recent Labs  Lab  09/20/22 0941  WBC 3.1*  NEUTROABS 2.0  HGB 12.8  HCT 37.1  MCV 84.1  PLT 16*   Basic Metabolic Panel: No results for input(s): "NA", "K", "CL", "CO2", "GLUCOSE", "BUN", "CREATININE", "CALCIUM", "MG", "PHOS" in the last 168 hours. GFR: CrCl cannot be calculated (Patient's most recent lab result is older than the maximum 21 days allowed.). Liver Function Tests: No results for input(s): "AST", "ALT", "ALKPHOS", "BILITOT", "PROT", "ALBUMIN" in the last 168 hours. No results for input(s): "LIPASE", "AMYLASE" in the last 168 hours. No results for input(s): "AMMONIA" in the last 168 hours. Coagulation Profile: No results for input(s): "INR", "PROTIME" in the last 168 hours. Cardiac Enzymes: No results for input(s): "CKTOTAL", "CKMB", "CKMBINDEX", "TROPONINI" in the last 168 hours. BNP (last 3 results) No results for input(s): "PROBNP" in the last 8760 hours. HbA1C: No results for input(s): "HGBA1C" in the  last 72 hours. CBG: No results for input(s): "GLUCAP" in the last 168 hours. Lipid Profile: No results for input(s): "CHOL", "HDL", "LDLCALC", "TRIG", "CHOLHDL", "LDLDIRECT" in the last 72 hours. Thyroid Function Tests: No results for input(s): "TSH", "T4TOTAL", "FREET4", "T3FREE", "THYROIDAB" in the last 72 hours. Anemia Panel: No results for input(s): "VITAMINB12", "FOLATE", "FERRITIN", "TIBC", "IRON", "RETICCTPCT" in the last 72 hours. Urine analysis:    Component Value Date/Time   COLORURINE YELLOW 02/27/2014 1503   APPEARANCEUR CLEAR 02/27/2014 1503   LABSPEC 1.010 02/27/2014 1503   PHURINE 6.0 02/27/2014 1503   GLUCOSEU NEG 02/27/2014 1503   HGBUR NEG 02/27/2014 1503   BILIRUBINUR NEG 02/27/2014 1503   KETONESUR NEG 02/27/2014 1503   PROTEINUR NEG 02/27/2014 1503   UROBILINOGEN 0.2 02/27/2014 1503   NITRITE NEG 02/27/2014 1503   LEUKOCYTESUR NEG 02/27/2014 1503    Radiological Exams on Admission: No results found.   Assessment/Plan Principal Problem:    Thrombocytopenia ([place]) Active Problems:   Hypertension   Vitamin D deficiency   Pure hypercholesterolemia     1.  Progressive thrombocytopenia and leukopenia.  History of melanoma complete excision and immunotherapy. Currently no evidence of active bleeding.  No indication for platelet transfusion at this time. Probable ITP, lymphoplasmacytic lymphoma or leukemia. As per oncology recommendation, will start on Solu-Medrol 1 mg/kg/day first dose now. If further drop in platelets, may consider IVIG and platelet transfusion. Case discussed with oncology, ordering for bone marrow biopsy with radiology tomorrow.  Keep n.p.o. past midnight.  2.  Essential hypertension: Blood pressure stable.  Resume home medications.  3.  Hyperlipidemia: On Crestor 10 mg daily.  Continue.  4.  Essential esophagitis: On Protonix.  Less likely contributing to thrombocytopenia.  Continue.   DVT prophylaxis: SCDs Code Status: Full code Family Communication: None at the bedside Disposition Plan: Home after work-up Consults called: Oncology, Dr. Alen Blew Admission status: Observation.  MedSurg bed.   Barb Merino MD [facility] Pager [phone]

## 2022-09-20 NOTE — Progress Notes (Addendum)
  [facility] OFFICE PROGRESS NOTE   Diagnosis: Leukopenia, thrombocytopenia  INTERVAL HISTORY:   [name] returns for a scheduled visit.  She has noted easy bruising and petechiae over the extremities.  No other bleeding.  No fever.  Good appetite.  Objective:  Vital signs in last 24 hours:  Blood pressure 132/82, pulse 69, temperature 98.1 F (36.7 C), temperature source Tympanic, resp. rate 18, weight 173 lb 3.2 oz (78.6 kg), SpO2 100 %.    HEENT: Few petechiae at the bilateral buccal mucosa, no thrush Lymphatics: No cervical, supraclavicular, axillary, or inguinal nodes Resp: Lungs clear bilaterally Cardio: Regular rate and rhythm GI: No hepatosplenomegaly Vascular: No leg edema  Skin: Small ecchymoses over the legs, petechiae at the arms and legs   Lab Results:  Lab Results  Component Value Date   WBC 3.0 (L) 08/23/2022   HGB 12.4 08/23/2022   HCT 36.5 08/23/2022   MCV 86.9 08/23/2022   PLT 64 (L) 08/23/2022   NEUTROABS 1.9 08/23/2022   Blood smear: The platelets are markedly decreased.  No platelet clumps.  The platelets are small.  Few ovalocytes.  The polychromasia appears mildly increased.  The majority the white cells are mature neutrophils and lymphocytes.  No blasts or other young forms are seen.  No monotonous white cell population. CMP  Lab Results  Component Value Date   NA 137 08/23/2022   K 4.1 08/23/2022   CL 100 08/23/2022   CO2 28 08/23/2022   GLUCOSE 86 08/23/2022   BUN 20 08/23/2022   CREATININE 1.24 (H) 08/23/2022   CALCIUM 10.0 08/23/2022   PROT 7.2 08/23/2022   ALBUMIN 4.4 08/23/2022   AST 22 08/23/2022   ALT 21 08/23/2022   ALKPHOS 55 08/23/2022   BILITOT 0.8 08/23/2022   GFRNONAA 48 (L) 08/23/2022   GFRAA 72 07/28/2020     Medications: I have reviewed the patient's current medications.   Assessment/Plan:  Thrombocytopenia-progressive 09/20/2022 Leukopenia History of melanoma located left leg below the knee  status post post excision 1998-completed an immunotherapy trial at [facility] Eosinophilic esophagitis Hypertension Anxiety History of colon polyps Hypercholesterolemia  Elevated IgM level Polyclonal hypergammaglobulinemia   Disposition: [name] was referred for evaluation of thrombocytopenia.  She has some mild leukopenia.  The platelet count and white count have been mildly decreased on several determinations over the past few years.  She has severe thrombocytopenia today.  I discussed the differential diagnosis including ITP, lymphoplasmacytic lymphoma, and leukemia.  She has skin and mouth bleeding.  I recommend an urgent bone marrow biopsy.  I will review the peripheral blood smear today.  We are unable to schedule bone marrow biopsy as an outpatient this week.  I recommend hospital admission for an expedited evaluation.  She will begin an empiric trial of prednisone.  We will add IVIG and consider a platelet transfusion if the platelet count falls further or she has increased bleeding.  I contacted the hospitalist service to arrange for hospital admission.  She will begin prednisone at a dose of 60 mg daily today.  I recommend Solu-Medrol, 1 mg/kilogram, to begin on hospital admission.  Recommendations: Prednisone while waiting on hospital admission Solu-Medrol 1 mg/kilogram IV daily Bone marrow biopsy as soon as possible  Betsy Coder, MD  09/20/2022  10:13 AM

## 2022-09-21 ENCOUNTER — Other Ambulatory Visit: Payer: Self-pay | Admitting: *Deleted

## 2022-09-21 ENCOUNTER — Observation Stay (HOSPITAL_COMMUNITY): Payer: Medicare Other

## 2022-09-21 DIAGNOSIS — D696 Thrombocytopenia, unspecified: Secondary | ICD-10-CM | POA: Diagnosis not present

## 2022-09-21 LAB — CBC WITH DIFFERENTIAL/PLATELET
Abs Immature Granulocytes: 0.01 10*3/uL (ref 0.00–0.07)
Basophils Absolute: 0 10*3/uL (ref 0.0–0.1)
Basophils Relative: 0 %
Eosinophils Absolute: 0 10*3/uL (ref 0.0–0.5)
Eosinophils Relative: 0 %
HCT: 37 % (ref 36.0–46.0)
Hemoglobin: 12.8 g/dL (ref 12.0–15.0)
Immature Granulocytes: 0 %
Lymphocytes Relative: 13 %
Lymphs Abs: 0.5 10*3/uL — ABNORMAL LOW (ref 0.7–4.0)
MCH: 29.4 pg (ref 26.0–34.0)
MCHC: 34.6 g/dL (ref 30.0–36.0)
MCV: 84.9 fL (ref 80.0–100.0)
Monocytes Absolute: 0.1 10*3/uL (ref 0.1–1.0)
Monocytes Relative: 3 %
Neutro Abs: 3.3 10*3/uL (ref 1.7–7.7)
Neutrophils Relative %: 84 %
Platelets: 18 10*3/uL — CL (ref 150–400)
RBC: 4.36 MIL/uL (ref 3.87–5.11)
RDW: 13 % (ref 11.5–15.5)
WBC: 3.9 10*3/uL — ABNORMAL LOW (ref 4.0–10.5)
nRBC: 0 % (ref 0.0–0.2)

## 2022-09-21 LAB — LACTATE DEHYDROGENASE: LDH: 166 U/L (ref 98–192)

## 2022-09-21 LAB — ANTINUCLEAR ANTIBODIES, IFA: ANA Ab, IFA: NEGATIVE

## 2022-09-21 MED ORDER — MIDAZOLAM HCL 2 MG/2ML IJ SOLN
INTRAMUSCULAR | Status: AC
  Filled 2022-09-21: qty 4

## 2022-09-21 MED ORDER — FENTANYL CITRATE (PF) 100 MCG/2ML IJ SOLN
INTRAMUSCULAR | Status: AC
  Filled 2022-09-21: qty 2

## 2022-09-21 MED ORDER — LIDOCAINE HCL (PF) 1 % IJ SOLN
INTRAMUSCULAR | Status: AC | PRN
  Administered 2022-09-21: 20 mL

## 2022-09-21 MED ORDER — PREDNISONE 20 MG PO TABS: 80.0000 mg | ORAL_TABLET | Freq: Every day | ORAL | 0 refills | Status: AC

## 2022-09-21 MED ORDER — ORAL CARE MOUTH RINSE: 15.0000 mL | OROMUCOSAL | Status: DC | PRN

## 2022-09-21 MED ORDER — FENTANYL CITRATE (PF) 100 MCG/2ML IJ SOLN
INTRAMUSCULAR | Status: AC | PRN
  Administered 2022-09-21 (×2): 50 ug via INTRAVENOUS

## 2022-09-21 MED ORDER — MIDAZOLAM HCL 2 MG/2ML IJ SOLN
INTRAMUSCULAR | Status: AC | PRN
  Administered 2022-09-21 (×2): 1 mg via INTRAVENOUS

## 2022-09-21 NOTE — Progress Notes (Signed)
D/C from hospital today. Per MD, needs CBC for 12/8 and CBC/OV on 12/11 or 1212. Scheduling message sent.

## 2022-09-21 NOTE — Consult Note (Signed)
Chief Complaint: Patient was seen in consultation today for bone marrow biopsy with aspiration   Referring Physician(s): Ladell Pier  Supervising Physician: Mir, Sharen Heck  Patient Status: [facility] - In-pt  History of Present Illness: Carol Thomas is a 66 y.o. female with a medical history significant for HTN, melanoma s/p resection in 1998, and recently diagnosed thrombocytopenia. She follows with Oncology and her team sent her to the ED 09/20/22 because of a significant drop in her platelet count. Her platelet count in the ED was 16.   Interventional Radiology has been asked to evaluate this patient for an image-guided bone marrow biopsy with aspiration for further work up.   Past Medical History:  Diagnosis Date   Adenomatous colon polyp 11/2008   Dr. Penelope Coop at [facility]) 1998   Melanoma-Left leg   Hypertension    Osteopenia 10/2012   T score of -2.2 FRAX 8%/1.1%   Vitamin D deficiency 10/2012   vitamin D level 10    Past Surgical History:  Procedure Laterality Date   APPENDECTOMY     ESOPHAGOGASTRODUODENOSCOPY  05/24/2017   Erythema in the middle third of the esphagus and in lower third.-biopised erythematous mucosa in the antrum, normal examined duodenum   Melanoma excised     TOTAL VAGINAL HYSTERECTOMY  1997   TAH   TUBAL LIGATION     UPPER GI ENDOSCOPY  04/03/2022   Dr. Ashok Croon gastritis, possible eosinophilic esophagitis    Allergies: Other  Medications: Prior to Admission medications   Medication Sig Start Date End Date Taking? Authorizing Provider  alendronate (FOSAMAX) 70 MG tablet Take 1 tablet (70 mg total) by mouth every 7 (seven) days. Take with a full glass of water on an empty stomach. Patient taking differently: Take 70 mg by mouth See admin instructions. Take 70 mg by mouth every Sunday with a full glass of water on an empty stomach 08/17/22  Yes Rita Ohara, MD  amLODipine (NORVASC) 10 MG tablet TAKE 1 TABLET  DAILY Patient taking differently: Take 10 mg by mouth in the morning. 08/10/22  Yes Rita Ohara, MD  Calcium-Magnesium-Zinc (CA-MG-ZN PO) Take 1 tablet by mouth 2 (two) times a week.   Yes [provider]  Cholecalciferol (VITAMIN D3) 50 MCG (2000 UT) TABS Take 2,000 Units by mouth in the morning.   Yes [provider]  citalopram (CELEXA) 20 MG tablet Take 1 tablet (20 mg total) by mouth daily. 08/17/22  Yes Rita Ohara, MD  co-enzyme Q-10 30 MG capsule Take 30 mg by mouth daily.   Yes [provider]  losartan-hydrochlorothiazide (HYZAAR) 100-25 MG tablet TAKE 1 TABLET DAILY 08/10/22  Yes Rita Ohara, MD  pantoprazole (PROTONIX) 40 MG tablet Take 40 mg by mouth daily before breakfast. 06/13/17  Yes [provider]  rosuvastatin (CRESTOR) 10 MG tablet Take 1 tablet (10 mg total) by mouth daily. 08/17/22  Yes Rita Ohara, MD  SYSTANE COMPLETE PF 0.6 % SOLN Place 1 drop into both eyes 3 (three) times daily as needed (for dryness or irritation).   Yes [provider]  cholecalciferol (VITAMIN D) 1000 units tablet Take 2,000 Units by mouth daily. Patient not taking: Reported on 09/20/2022    [provider]  predniSONE (DELTASONE) 20 MG tablet Take 3 tablets (60 mg total) by mouth daily with breakfast. Patient not taking: Reported on 09/20/2022 09/20/22   Ladell Pier, MD     Family History  Problem Relation Age  of Onset   Hypertension Mother    Diabetes Mother    Osteoporosis Mother    Thyroid disease Mother    COPD Mother    Hyperlipidemia Mother    Heart attack Mother 43   Stroke Daughter        during pregnancy   Depression Daughter    Bipolar disorder Son    Hyperlipidemia Sister    Colon cancer Maternal Uncle        39's    Social History   Socioeconomic History   Marital status: Married    Spouse name: Not on file   Number of children: 2   Years of education: Not on file   Highest education level: Not on file  Occupational  History   Occupation: [employer] banker, operational)  Tobacco Use   Smoking status: Never   Smokeless tobacco: Never  Substance and Sexual Activity   Alcohol use: No   Drug use: No   Sexual activity: Not Currently    Birth control/protection: Surgical  Other Topics Concern   Not on file  Social History Narrative   Married.  Lives with husband, mother-in-law, 1 cat.  1 dog.   Son and 2 grandsons had moved in with her in June 2022 after daughter-in-law passed suddenly (MI at 71)).   2 grandsons stay with her frequently (son stays at his girlfriend's).   Mother-in-law has vascular dementia, and attends [employer] 4x/week.        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         Updated 08/2022   Social Determinants of Health   Financial Resource Strain: Not on file  Food Insecurity: No Food Insecurity (09/20/2022)   Hunger Vital Sign    Worried About Running Out of Food in the Last Year: Never true    Ran Out of Food in the Last Year: Never true  Transportation Needs: No Transportation Needs (09/20/2022)   PRAPARE - Hydrologist (Medical): No    Lack of Transportation (Non-Medical): No  Physical Activity: Not on file  Stress: Not on file  Social Connections: Not on file    Review of Systems: A 12 point ROS discussed and pertinent positives are indicated in the HPI above.  All other systems are negative.  Review of Systems  Constitutional:  Negative for appetite change and fatigue.  Respiratory:  Negative for cough and shortness of breath.   Cardiovascular:  Negative for chest pain and leg swelling.  Gastrointestinal:  Negative for abdominal pain, diarrhea, nausea and vomiting.  Musculoskeletal:  Negative for back pain.  Neurological:  Negative for dizziness and headaches.    Vital Signs: BP 135/83 (BP Location: Right Arm)   Pulse (!) 57   Temp 97.7 F (36.5 C) (Oral)   Resp 16   SpO2 98%    Physical Exam Constitutional:      General: She is not in acute distress.    Appearance: She is not ill-appearing.  HENT:     Mouth/Throat:     Mouth: Mucous membranes are moist.     Pharynx: Oropharynx is clear.  Cardiovascular:     Rate and Rhythm: Normal rate and regular rhythm.     Pulses: Normal pulses.     Heart sounds: Normal heart sounds.  Pulmonary:     Effort: Pulmonary effort is normal.     Breath sounds: Normal breath sounds.  Abdominal:     General:  Bowel sounds are normal.     Palpations: Abdomen is soft.     Tenderness: There is no abdominal tenderness.  Skin:    General: Skin is warm and dry.  Neurological:     Mental Status: She is alert and oriented to person, place, and time.     Imaging: No results found.  Labs:  CBC: Recent Labs    06/08/22 1117 08/11/22 1002 08/23/22 1049 09/20/22 0941  WBC 4.4 2.9* 3.0* 3.1*  HGB 13.0 13.3 12.4 12.8  HCT 38.3 38.3 36.5 37.1  PLT 79* 73* 64* 16*    COAGS: No results for input(s): "INR", "APTT" in the last 8760 hours.  BMP: Recent Labs    02/02/22 0924 06/02/22 0833 08/23/22 1049  NA  --  135 137  K  --  4.9 4.1  CL  --  99 100  CO2  --  25 28  GLUCOSE 105* 96 86  BUN  --  13 20  CALCIUM  --  9.5 10.0  CREATININE  --  1.08* 1.24*  GFRNONAA  --   --  48*    LIVER FUNCTION TESTS: Recent Labs    06/02/22 0833 08/11/22 1002 08/23/22 1049  BILITOT 0.8 0.9 0.8  AST _0 ALT _1 ALKPHOS 73 74 55  PROT 6.8 7.1 7.2  ALBUMIN 4.5 4.5 4.4    TUMOR MARKERS: No results for input(s): "AFPTM", "CEA", "CA199", "CHROMGRNA" in the last 8760 hours.  Assessment and Plan:  Thrombocytopenia: Eliyanna L. 58, 66 year old female, is tentatively scheduled today for an image-guided bone marrow biopsy with aspiration.  Risks and benefits of this procedure were discussed with the patient and/or patient's family including, but not limited to bleeding, infection, damage to adjacent structures  or low yield requiring additional tests.  All of the questions were answered and there is agreement to proceed. She has been NPO.   Consent signed and in chart.  Thank you for this interesting consult.  I greatly enjoyed meeting AUBREANNA PERCLE and look forward to participating in their care.  A copy of this report was sent to the requesting provider on this date.  Electronically Signed: Soyla Dryer, AGACNP-BC [phone] 09/21/2022, 8:15 AM   I spent a total of 20 Minutes    in face to face in clinical consultation, greater than 50% of which was counseling/coordinating care for bone marrow biopsy with aspiration

## 2022-09-21 NOTE — Plan of Care (Signed)
  Problem: Health Behavior/Discharge Planning: Goal: Ability to manage health-related needs will improve Outcome: Progressing   Problem: Coping: Goal: Level of anxiety will decrease Outcome: Progressing   Problem: Pain Managment: Goal: General experience of comfort will improve Outcome: Progressing   Problem: Safety: Goal: Ability to remain free from injury will improve Outcome: Progressing   Problem: Skin Integrity: Goal: Risk for impaired skin integrity will decrease Outcome: Progressing   

## 2022-09-21 NOTE — Procedures (Signed)
Interventional Radiology Procedure Note  Indication: Thrombocytopenia   Procedure: CT guided aspirate and core biopsy of right iliac bone  Complications: None  Bleeding: Minimal  Miachel Roux, MD [phone]

## 2022-09-21 NOTE — Discharge Summary (Signed)
Physician Discharge Summary  Carol Thomas ALP:[id number] DOB: 1955/12/23 DOA: 09/20/2022  PCP: Rita Ohara, MD  Admit date: 09/20/2022 Discharge date: 09/21/2022  Admitted From: Home Disposition: Home  Recommendations for Outpatient Follow-up:  Lab test is scheduled at the cancer center tomorrow  Home Health: N/A Equipment/Devices: N/A  Discharge Condition: Stable CODE STATUS: Full code Diet recommendation: Low-salt diet  Discharge summary: 66 year old with history of essential hypertension hyperlipidemia and GERD recently diagnosed with thrombocytopenia and followed up at oncology office was sent to admit due to rapid decline in platelets with some petechial rashes but no active bleeding.  Platelets 16,000 on presentation.  Started on high-dose IV steroids.  Underwent bone marrow biopsy today.  Results pending.  Followed by oncology.  Platelets 18,000 today.  Patient is stable.  Plan: As per oncology recommendations, patient is going home with prednisone 80 mg daily to continue until further notice. Close follow-up, repeat lab test planned for tomorrow and follow-up with oncology planned for next week. She will report for any spontaneous bleeding noted. Resume all home medications including antihypertensive medications.   Discharge Diagnoses:  Principal Problem:   Thrombocytopenia ([place]) Active Problems:   Hypertension   Vitamin D deficiency   Pure hypercholesterolemia    Discharge Instructions  Discharge Instructions     Diet - low sodium heart healthy   Complete by: As directed    Increase activity slowly   Complete by: As directed       Allergies as of 09/21/2022       Reactions   Other Other (See Comments)   Patient has Eosinophilic Esophagitis = Heartburn/acid reflux Eosinophilic esophagitis (EoE) is an inflammation of the esophagus (the tube connecting the mouth to the stomach), caused by a specific white blood cell - the eosinophil. NO eggs, juices, hot  dogs, milk, cucumbers, etc...Marland KitchenMarland KitchenMarland Kitchen        Medication List     TAKE these medications    alendronate 70 MG tablet Commonly known as: FOSAMAX Take 1 tablet (70 mg total) by mouth every 7 (seven) days. Take with a full glass of water on an empty stomach. What changed:  when to take this additional instructions   amLODipine 10 MG tablet Commonly known as: NORVASC TAKE 1 TABLET DAILY What changed: when to take this   CA-MG-ZN PO Take 1 tablet by mouth 2 (two) times a week.   citalopram 20 MG tablet Commonly known as: CELEXA Take 1 tablet (20 mg total) by mouth daily.   co-enzyme Q-10 30 MG capsule Take 30 mg by mouth daily.   losartan-hydrochlorothiazide 100-25 MG tablet Commonly known as: HYZAAR TAKE 1 TABLET DAILY   pantoprazole 40 MG tablet Commonly known as: PROTONIX Take 40 mg by mouth daily before breakfast.   predniSONE 20 MG tablet Commonly known as: DELTASONE Take 4 tablets (80 mg total) by mouth daily with breakfast for 14 days. What changed: how much to take   rosuvastatin 10 MG tablet Commonly known as: Crestor Take 1 tablet (10 mg total) by mouth daily.   Systane Complete PF 0.6 % Soln Generic drug: Propylene Glycol (PF) Place 1 drop into both eyes 3 (three) times daily as needed (for dryness or irritation).   Vitamin D3 50 MCG (2000 UT) Tabs Take 2,000 Units by mouth in the morning. What changed: Another medication with the same name was removed. Continue taking this medication, and follow the directions you see here.        Allergies  Allergen Reactions  Other Other (See Comments)    Patient has Eosinophilic Esophagitis = Heartburn/acid reflux Eosinophilic esophagitis (EoE) is an inflammation of the esophagus (the tube connecting the mouth to the stomach), caused by a specific white blood cell - the eosinophil. NO eggs, juices, hot dogs, milk, cucumbers, etc...Marland KitchenMarland KitchenMarland Kitchen    Consultations: Oncology   Procedures/Studies: CT BONE MARROW BIOPSY &  ASPIRATION  Result Date: 09/21/2022 INDICATION: Thrombocytopenia EXAM: CT GUIDED BONE MARROW ASPIRATES AND BIOPSY MEDICATIONS: None. ANESTHESIA/SEDATION: Fentanyl 100 mcg IV; Versed 2 mg IV Moderate Sedation Time:  10 minutes The patient was continuously monitored during the procedure by the interventional radiology nurse under my direct supervision. COMPLICATIONS: None immediate. PROCEDURE: The procedure was explained to the patient. The risks and benefits of the procedure were discussed and the patient's questions were addressed. Informed consent was obtained from the patient. The patient was placed prone on CT table. Images of the pelvis were obtained. The right side of back was prepped and draped in sterile fashion. The skin and right posterior ilium were anesthetized with 1% lidocaine. 11 gauge bone needle was directed into the right ilium with CT guidance. Two aspirates and 1 core biopsy were obtained. Bandage placed over the puncture site. IMPRESSION: CT guided bone marrow aspiration and core biopsy. Electronically Signed   By: Miachel Roux M.D.   On: 09/21/2022 13:30   (Echo, Carotid, EGD, Colonoscopy, ERCP)    Subjective: Seen and examined in the morning rounds.  Was preparing to go to procedure.  No other overnight events.  Did not sleep well.   Discharge Exam: Vitals:   09/21/22 1205 09/21/22 1210  BP: 132/75 130/71  Pulse: 63 64  Resp: 13 14  Temp:    SpO2: 99% 99%   Vitals:   09/21/22 0503 09/21/22 1155 09/21/22 1205 09/21/22 1210  BP: 135/83 (!) 141/76 132/75 130/71  Pulse: (!) 57 (!) 59 63 64  Resp:  _0 Temp: 97.7 F (36.5 C)     TempSrc: Oral     SpO2: 98% 100% 99% 99%    General: Pt is alert, awake, not in acute distress Cardiovascular: RRR, S1/S2 +, no rubs, no gallops Respiratory: CTA bilaterally, no wheezing, no rhonchi Abdominal: Soft, NT, ND, bowel sounds + Extremities: no edema, no cyanosis Patient has scattered purpuric rashes mostly on the extensor  surface of hands and legs.  Mucous membranes without any evidence of bleeding.    The results of significant diagnostics from this hospitalization (including imaging, microbiology, ancillary and laboratory) are listed below for reference.     Microbiology: No results found for this or any previous visit (from the past 240 hour(s)).   Labs: BNP (last 3 results) No results for input(s): "BNP" in the last 8760 hours. Basic Metabolic Panel: No results for input(s): "NA", "K", "CL", "CO2", "GLUCOSE", "BUN", "CREATININE", "CALCIUM", "MG", "PHOS" in the last 168 hours. Liver Function Tests: No results for input(s): "AST", "ALT", "ALKPHOS", "BILITOT", "PROT", "ALBUMIN" in the last 168 hours. No results for input(s): "LIPASE", "AMYLASE" in the last 168 hours. No results for input(s): "AMMONIA" in the last 168 hours. CBC: Recent Labs  Lab 09/20/22 0941 09/21/22 0655  WBC 3.1* 3.9*  NEUTROABS 2.0 3.3  HGB 12.8 12.8  HCT 37.1 37.0  MCV 84.1 84.9  PLT 16* 18*   Cardiac Enzymes: No results for input(s): "CKTOTAL", "CKMB", "CKMBINDEX", "TROPONINI" in the last 168 hours. BNP: Invalid input(s): "POCBNP" CBG: No results for input(s): "GLUCAP" in the last 168 hours. D-Dimer  No results for input(s): "DDIMER" in the last 72 hours. Hgb A1c No results for input(s): "HGBA1C" in the last 72 hours. Lipid Profile No results for input(s): "CHOL", "HDL", "LDLCALC", "TRIG", "CHOLHDL", "LDLDIRECT" in the last 72 hours. Thyroid function studies No results for input(s): "TSH", "T4TOTAL", "T3FREE", "THYROIDAB" in the last 72 hours.  Invalid input(s): "FREET3" Anemia work up No results for input(s): "VITAMINB12", "FOLATE", "FERRITIN", "TIBC", "IRON", "RETICCTPCT" in the last 72 hours. Urinalysis    Component Value Date/Time   COLORURINE YELLOW 02/27/2014 1503   APPEARANCEUR CLEAR 02/27/2014 1503   LABSPEC 1.010 02/27/2014 1503   PHURINE 6.0 02/27/2014 1503   GLUCOSEU NEG 02/27/2014 1503   HGBUR  NEG 02/27/2014 1503   BILIRUBINUR NEG 02/27/2014 1503   KETONESUR NEG 02/27/2014 1503   PROTEINUR NEG 02/27/2014 1503   UROBILINOGEN 0.2 02/27/2014 1503   NITRITE NEG 02/27/2014 1503   LEUKOCYTESUR NEG 02/27/2014 1503   Sepsis Labs Recent Labs  Lab 09/20/22 0941 09/21/22 0655  WBC 3.1* 3.9*   Microbiology No results found for this or any previous visit (from the past 240 hour(s)).   Time coordinating discharge: 28 minutes  SIGNED:   Barb Merino, MD  [facility] 09/21/2022, 1:50 PM

## 2022-09-21 NOTE — Progress Notes (Signed)
IP PROGRESS NOTE  Subjective:   Carol Thomas was admitted yesterday.  No bleeding.  She reports a headache this morning.  Objective: Vital signs in last 24 hours: Blood pressure 130/71, pulse 64, temperature 97.7 F (36.5 C), temperature source Oral, resp. rate 14, SpO2 99 %.  Intake/Output from previous day: 12/06 0701 - 12/07 0700 In: -  Out: 1800 [Urine:1800]  Physical Exam:  HEENT: No petechiae, bleeding, or thrush Abdomen: Nontender, no hepatosplenomegaly Extremities: No leg edema Skin: Petechiae at the lower legs and feet, small ecchymoses over the extremities   Lab Results: Recent Labs    09/20/22 0941 09/21/22 0655  WBC 3.1* 3.9*  HGB 12.8 12.8  HCT 37.1 37.0  PLT 16* 18*    BMET No results for input(s): "NA", "K", "CL", "CO2", "GLUCOSE", "BUN", "CREATININE", "CALCIUM" in the last 72 hours.  No results found for: "CEA1", "CEA", "XLE174", "CA125"  Studies/Results: CT BONE MARROW BIOPSY & ASPIRATION  Result Date: 09/21/2022 INDICATION: Thrombocytopenia EXAM: CT GUIDED BONE MARROW ASPIRATES AND BIOPSY MEDICATIONS: None. ANESTHESIA/SEDATION: Fentanyl 100 mcg IV; Versed 2 mg IV Moderate Sedation Time:  10 minutes The patient was continuously monitored during the procedure by the interventional radiology nurse under my direct supervision. COMPLICATIONS: None immediate. PROCEDURE: The procedure was explained to the patient. The risks and benefits of the procedure were discussed and the patient's questions were addressed. Informed consent was obtained from the patient. The patient was placed prone on CT table. Images of the pelvis were obtained. The right side of back was prepped and draped in sterile fashion. The skin and right posterior ilium were anesthetized with 1% lidocaine. 11 gauge bone needle was directed into the right ilium with CT guidance. Two aspirates and 1 core biopsy were obtained. Bandage placed over the puncture site. IMPRESSION: CT guided bone marrow  aspiration and core biopsy. Electronically Signed   By: Miachel Roux M.D.   On: 09/21/2022 13:30    Medications: I have reviewed the patient's current medications.  Assessment/Plan: Thrombocytopenia-progressive 09/20/2022 Admission 09/20/2022 with severe thrombocytopenia, prednisone 09/20/2022, Solu-Medrol 1 mg/kilogram daily start 09/20/2022 Leukopenia History of melanoma located left leg below the knee status post post excision 1998-completed an immunotherapy trial at [facility] Eosinophilic esophagitis Hypertension Anxiety History of colon polyps Hypercholesterolemia  Elevated IgM level Polyclonal hypergammaglobulinemia   Carol Thomas appears stable.  The platelet count is unchanged. She will undergo a bone marrow biopsy today.  She can be discharged to home after the bone marrow biopsy if she remains stable.  She should continue prednisone, 80 mg daily, at discharge.  She will be scheduled for an outpatient CBC at the Cancer center on 09/22/2022.  Outpatient follow-up will be scheduled at the Cancer center for the week of 09/25/2022.   LOS: 1 day   Betsy Coder, MD   09/21/2022, 1:37 PM

## 2022-09-21 NOTE — TOC Progression Note (Addendum)
Transition of Care [facility]) - Initial/Assessment Note    Patient Details  Name: Carol Thomas MRN: [id number] Date of Birth: Feb 17, 1956  Transition of Care [facility]) CM/SW Contact:    Servando Snare, LCSW Phone Number: 09/21/2022, 9:46 AM  Clinical Narrative:                  Transition of Care [facility]) Screening Note   Patient Details  Name: Carol Thomas Date of Birth: 1956/09/11   Transition of Care [facility]) CM/SW Contact:    Servando Snare, LCSW Phone Number: 09/21/2022, 9:47 AM    Transition of Care Department [facility]) has reviewed patient and no TOC needs have been identified at this time. We will continue to monitor patient advancement through interdisciplinary progression rounds. If new patient transition needs arise, please place a TOC consult.         Patient Goals and CMS Choice        Expected Discharge Plan and Services                                                Prior Living Arrangements/Services                       Activities of Daily Living Home Assistive Devices/Equipment: Eyeglasses ADL Screening (condition at time of admission) Patient's cognitive ability adequate to safely complete daily activities?: Yes Is the patient deaf or have difficulty hearing?: No Does the patient have difficulty seeing, even when wearing glasses/contacts?: Yes Does the patient have difficulty concentrating, remembering, or making decisions?: No Patient able to express need for assistance with ADLs?: Yes Does the patient have difficulty dressing or bathing?: No Independently performs ADLs?: Yes (appropriate for developmental age) Does the patient have difficulty walking or climbing stairs?: No Weakness of Legs: None Weakness of Arms/Hands: None  Permission Sought/Granted                  Emotional Assessment              Admission diagnosis:  Thrombocytopenia ([place]) [D69.6] Patient Active Problem List   Diagnosis Date Noted    Thrombocytopenia ([place]) 09/20/2022   Plantar fasciitis 03/05/2018   Vitamin D deficiency 02/23/2015   Impaired fasting glucose 02/23/2015   Pure hypercholesterolemia 02/23/2015   Allergic rhinitis 08/19/2014   External hemorrhoid 08/19/2014   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[place]) 08/19/2014   Avitaminosis D 08/19/2014   Combined fat and carbohydrate induced hyperlipemia 08/19/2014   Anxiety disorder 08/19/2014   Essential (primary) hypertension 03/18/2013   Osteopenia 10/16/2012   Hypertension    Malignant melanoma of skin of left leg ([place])    Tubular adenoma of colon 12/03/2008   PCP:  Rita Ohara, MD Pharmacy:   [facility] [place], [address] [place] [address] Phone: [phone] Fax: [phone]     Social Determinants of Health (SDOH) Interventions    Readmission Risk Interventions     No data to display

## 2022-09-22 ENCOUNTER — Telehealth: Payer: Self-pay | Admitting: *Deleted

## 2022-09-22 ENCOUNTER — Inpatient Hospital Stay: Payer: Medicare Other

## 2022-09-22 DIAGNOSIS — K200 Eosinophilic esophagitis: Secondary | ICD-10-CM | POA: Diagnosis not present

## 2022-09-22 DIAGNOSIS — D696 Thrombocytopenia, unspecified: Secondary | ICD-10-CM | POA: Diagnosis not present

## 2022-09-22 DIAGNOSIS — D72819 Decreased white blood cell count, unspecified: Secondary | ICD-10-CM | POA: Diagnosis not present

## 2022-09-22 DIAGNOSIS — Z85820 Personal history of malignant melanoma of skin: Secondary | ICD-10-CM | POA: Diagnosis not present

## 2022-09-22 DIAGNOSIS — R768 Other specified abnormal immunological findings in serum: Secondary | ICD-10-CM | POA: Diagnosis not present

## 2022-09-22 DIAGNOSIS — Z8719 Personal history of other diseases of the digestive system: Secondary | ICD-10-CM | POA: Diagnosis not present

## 2022-09-22 DIAGNOSIS — I10 Essential (primary) hypertension: Secondary | ICD-10-CM | POA: Diagnosis not present

## 2022-09-22 DIAGNOSIS — E7800 Pure hypercholesterolemia, unspecified: Secondary | ICD-10-CM | POA: Diagnosis not present

## 2022-09-22 DIAGNOSIS — F419 Anxiety disorder, unspecified: Secondary | ICD-10-CM | POA: Diagnosis not present

## 2022-09-22 DIAGNOSIS — D890 Polyclonal hypergammaglobulinemia: Secondary | ICD-10-CM | POA: Diagnosis not present

## 2022-09-22 LAB — CBC WITH DIFFERENTIAL (CANCER CENTER ONLY)
Abs Immature Granulocytes: 0.02 10*3/uL (ref 0.00–0.07)
Basophils Absolute: 0 10*3/uL (ref 0.0–0.1)
Basophils Relative: 0 %
Eosinophils Absolute: 0 10*3/uL (ref 0.0–0.5)
Eosinophils Relative: 0 %
HCT: 37.1 % (ref 36.0–46.0)
Hemoglobin: 13 g/dL (ref 12.0–15.0)
Immature Granulocytes: 0 %
Lymphocytes Relative: 18 %
Lymphs Abs: 1.3 10*3/uL (ref 0.7–4.0)
MCH: 29.4 pg (ref 26.0–34.0)
MCHC: 35 g/dL (ref 30.0–36.0)
MCV: 83.9 fL (ref 80.0–100.0)
Monocytes Absolute: 0.5 10*3/uL (ref 0.1–1.0)
Monocytes Relative: 7 %
Neutro Abs: 5 10*3/uL (ref 1.7–7.7)
Neutrophils Relative %: 75 %
Platelet Count: 56 10*3/uL — ABNORMAL LOW (ref 150–400)
RBC: 4.42 MIL/uL (ref 3.87–5.11)
RDW: 13.2 % (ref 11.5–15.5)
WBC Count: 6.8 10*3/uL (ref 4.0–10.5)
nRBC: 0 % (ref 0.0–0.2)

## 2022-09-22 NOTE — Telephone Encounter (Addendum)
Informed patient that platelets are up to 56,000 today. Continue Prednisone at 80 mg daily and f/u next week as scheduled. Notified that per pathologist, her bone marrow biopsy is not looking like leukemia or lymphoma at this time.

## 2022-09-25 LAB — SURGICAL PATHOLOGY

## 2022-09-26 ENCOUNTER — Inpatient Hospital Stay (HOSPITAL_BASED_OUTPATIENT_CLINIC_OR_DEPARTMENT_OTHER): Payer: Medicare Other | Admitting: Oncology

## 2022-09-26 ENCOUNTER — Inpatient Hospital Stay: Payer: Medicare Other

## 2022-09-26 VITALS — BP 140/85 | HR 63 | Temp 98.1°F | Resp 18 | Ht 65.0 in | Wt 176.8 lb

## 2022-09-26 DIAGNOSIS — D696 Thrombocytopenia, unspecified: Secondary | ICD-10-CM

## 2022-09-26 LAB — CBC WITH DIFFERENTIAL (CANCER CENTER ONLY)
Abs Immature Granulocytes: 0.06 10*3/uL (ref 0.00–0.07)
Basophils Absolute: 0 10*3/uL (ref 0.0–0.1)
Basophils Relative: 0 %
Eosinophils Absolute: 0.1 10*3/uL (ref 0.0–0.5)
Eosinophils Relative: 1 %
HCT: 38.6 % (ref 36.0–46.0)
Hemoglobin: 13.4 g/dL (ref 12.0–15.0)
Immature Granulocytes: 1 %
Lymphocytes Relative: 23 %
Lymphs Abs: 2.1 10*3/uL (ref 0.7–4.0)
MCH: 29.3 pg (ref 26.0–34.0)
MCHC: 34.7 g/dL (ref 30.0–36.0)
MCV: 84.5 fL (ref 80.0–100.0)
Monocytes Absolute: 0.7 10*3/uL (ref 0.1–1.0)
Monocytes Relative: 7 %
Neutro Abs: 6.2 10*3/uL (ref 1.7–7.7)
Neutrophils Relative %: 68 %
Platelet Count: 211 10*3/uL (ref 150–400)
RBC: 4.57 MIL/uL (ref 3.87–5.11)
RDW: 13.5 % (ref 11.5–15.5)
WBC Count: 9.2 10*3/uL (ref 4.0–10.5)
nRBC: 0 % (ref 0.0–0.2)

## 2022-09-26 NOTE — Progress Notes (Signed)
  [place] OFFICE PROGRESS NOTE   Diagnosis: ITP  INTERVAL HISTORY:   Carol Thomas returns as scheduled.  She underwent a bone marrow biopsy on 09/21/2022.  She reports tolerating procedure well.  She continues prednisone at a dose of 80 mg daily. The bone marrow is hypercellular with increased megakaryocytes.  No increase in blasts.  No evidence of a lymphoproliferative disorder.  Flow cytometry did not reveal a blast population.  The lymphoid population show predominantly T cells.  She continues to bruise easily.  She is tolerating the prednisone well.  No alteration of mental status.  Objective:  Vital signs in last 24 hours:  Blood pressure (!) 140/85, pulse 63, temperature 98.1 F (36.7 C), temperature source Oral, resp. rate 18, height _0  (1.651 m), weight 176 lb 12.8 oz (80.2 kg), SpO2 100 %.    HEENT: No thrush or bleeding Resp: Bilateral end inspiratory bronchial sounds, no respiratory distress Cardio: Regular rate and rhythm GI: No hepatosplenomegaly Vascular: No leg edema  Skin: Mild ecchymoses over the arms, marked decrease in petechiae at the arms and lower legs.  Bone marrow site with a small amount of blood on the covering bandage  Lab Results:  Lab Results  Component Value Date   WBC 9.2 09/26/2022   HGB 13.4 09/26/2022   HCT 38.6 09/26/2022   MCV 84.5 09/26/2022   PLT 211 09/26/2022   NEUTROABS 6.2 09/26/2022    CMP  Lab Results  Component Value Date   NA 137 08/23/2022   K 4.1 08/23/2022   CL 100 08/23/2022   CO2 28 08/23/2022   GLUCOSE 86 08/23/2022   BUN 20 08/23/2022   CREATININE 1.24 (H) 08/23/2022   CALCIUM 10.0 08/23/2022   PROT 7.2 08/23/2022   ALBUMIN 4.4 08/23/2022   AST 22 08/23/2022   ALT 21 08/23/2022   ALKPHOS 55 08/23/2022   BILITOT 0.8 08/23/2022   GFRNONAA 48 (L) 08/23/2022   GFRAA 72 07/28/2020    Medications: I have reviewed the patient's current  medications.   Assessment/Plan: Thrombocytopenia-progressive 09/20/2022 Admission 09/20/2022 with severe thrombocytopenia, prednisone 09/20/2022, Solu-Medrol 1 mg/kilogram daily start 09/20/2022 Bone marrow biopsy 09/21/2022-hypercellular marrow for age, increased megakaryocytes, no evidence of a lymphoproliferative disorder Prednisone 80 mg daily 09/22/2022 Prednisone 40 mg daily 09/26/2022 Leukopenia History of melanoma located left leg below the knee status post post excision 1998-completed an immunotherapy trial at [facility] Eosinophilic esophagitis Hypertension Anxiety History of colon polyps Hypercholesterolemia  Elevated IgM level Polyclonal hypergammaglobulinemia     Disposition: Carol Thomas presented with thrombocytopenia.  Clinical presentation and bone marrow findings are consistent with a diagnosis of ITP.  The thrombocytopenia has corrected with steroids.  The prednisone will be tapered to 40 mg daily.  She will return for a CBC in approximately 10 days.  She will be scheduled for an office visit in 1 month.  She will call for bleeding or new symptoms.  I suspect she has a systemic inflammatory condition such as a collagen vascular disease.  The ANA was negative on 09/20/2022.  There is no clinical evidence for a lymphoproliferative disorder.  Carol Coder, MD  09/26/2022  11:20 AM

## 2022-09-29 ENCOUNTER — Encounter (HOSPITAL_COMMUNITY): Payer: Self-pay | Admitting: Oncology

## 2022-10-04 ENCOUNTER — Inpatient Hospital Stay: Payer: Medicare Other

## 2022-10-04 ENCOUNTER — Telehealth: Payer: Self-pay | Admitting: *Deleted

## 2022-10-04 DIAGNOSIS — D696 Thrombocytopenia, unspecified: Secondary | ICD-10-CM | POA: Diagnosis not present

## 2022-10-04 LAB — CBC WITH DIFFERENTIAL (CANCER CENTER ONLY)
Abs Immature Granulocytes: 0.09 10*3/uL — ABNORMAL HIGH (ref 0.00–0.07)
Basophils Absolute: 0 10*3/uL (ref 0.0–0.1)
Basophils Relative: 0 %
Eosinophils Absolute: 0.1 10*3/uL (ref 0.0–0.5)
Eosinophils Relative: 1 %
HCT: 37.8 % (ref 36.0–46.0)
Hemoglobin: 13.1 g/dL (ref 12.0–15.0)
Immature Granulocytes: 1 %
Lymphocytes Relative: 28 %
Lymphs Abs: 1.9 10*3/uL (ref 0.7–4.0)
MCH: 29.4 pg (ref 26.0–34.0)
MCHC: 34.7 g/dL (ref 30.0–36.0)
MCV: 84.9 fL (ref 80.0–100.0)
Monocytes Absolute: 0.5 10*3/uL (ref 0.1–1.0)
Monocytes Relative: 7 %
Neutro Abs: 4.3 10*3/uL (ref 1.7–7.7)
Neutrophils Relative %: 63 %
Platelet Count: 177 10*3/uL (ref 150–400)
RBC: 4.45 MIL/uL (ref 3.87–5.11)
RDW: 13.5 % (ref 11.5–15.5)
WBC Count: 6.8 10*3/uL (ref 4.0–10.5)
nRBC: 0 % (ref 0.0–0.2)

## 2022-10-04 NOTE — Telephone Encounter (Addendum)
-----   Message from Ladell Pier, MD sent at 10/04/2022  2:34 PM EST ----- Please call patient, platelet count remains normal, decrease prednisone to 30 mg daily, follow-up as scheduled  Carol Thomas notified of platelet count and directions to reduce Prednisone to 30 mg daily. F/U as scheduled.

## 2022-10-18 ENCOUNTER — Inpatient Hospital Stay: Payer: Medicare Other

## 2022-10-18 ENCOUNTER — Inpatient Hospital Stay: Payer: Medicare Other | Attending: Nurse Practitioner | Admitting: Oncology

## 2022-10-18 VITALS — BP 146/83 | HR 68 | Temp 98.2°F | Resp 20 | Ht 65.0 in | Wt 174.6 lb

## 2022-10-18 DIAGNOSIS — D696 Thrombocytopenia, unspecified: Secondary | ICD-10-CM

## 2022-10-18 DIAGNOSIS — K200 Eosinophilic esophagitis: Secondary | ICD-10-CM | POA: Diagnosis not present

## 2022-10-18 DIAGNOSIS — E7800 Pure hypercholesterolemia, unspecified: Secondary | ICD-10-CM | POA: Insufficient documentation

## 2022-10-18 DIAGNOSIS — Z7952 Long term (current) use of systemic steroids: Secondary | ICD-10-CM | POA: Diagnosis not present

## 2022-10-18 DIAGNOSIS — D693 Immune thrombocytopenic purpura: Secondary | ICD-10-CM | POA: Insufficient documentation

## 2022-10-18 DIAGNOSIS — I10 Essential (primary) hypertension: Secondary | ICD-10-CM | POA: Diagnosis not present

## 2022-10-18 DIAGNOSIS — D72819 Decreased white blood cell count, unspecified: Secondary | ICD-10-CM | POA: Diagnosis not present

## 2022-10-18 DIAGNOSIS — Z85820 Personal history of malignant melanoma of skin: Secondary | ICD-10-CM | POA: Diagnosis not present

## 2022-10-18 DIAGNOSIS — Z8719 Personal history of other diseases of the digestive system: Secondary | ICD-10-CM | POA: Diagnosis not present

## 2022-10-18 DIAGNOSIS — F419 Anxiety disorder, unspecified: Secondary | ICD-10-CM | POA: Insufficient documentation

## 2022-10-18 DIAGNOSIS — Z79899 Other long term (current) drug therapy: Secondary | ICD-10-CM | POA: Insufficient documentation

## 2022-10-18 DIAGNOSIS — R768 Other specified abnormal immunological findings in serum: Secondary | ICD-10-CM | POA: Diagnosis not present

## 2022-10-18 LAB — CBC WITH DIFFERENTIAL (CANCER CENTER ONLY)
Abs Immature Granulocytes: 0.07 10*3/uL (ref 0.00–0.07)
Basophils Absolute: 0 10*3/uL (ref 0.0–0.1)
Basophils Relative: 0 %
Eosinophils Absolute: 0 10*3/uL (ref 0.0–0.5)
Eosinophils Relative: 0 %
HCT: 41.3 % (ref 36.0–46.0)
Hemoglobin: 14.2 g/dL (ref 12.0–15.0)
Immature Granulocytes: 1 %
Lymphocytes Relative: 11 %
Lymphs Abs: 1 10*3/uL (ref 0.7–4.0)
MCH: 29.4 pg (ref 26.0–34.0)
MCHC: 34.4 g/dL (ref 30.0–36.0)
MCV: 85.5 fL (ref 80.0–100.0)
Monocytes Absolute: 0.3 10*3/uL (ref 0.1–1.0)
Monocytes Relative: 3 %
Neutro Abs: 7.4 10*3/uL (ref 1.7–7.7)
Neutrophils Relative %: 85 %
Platelet Count: 87 10*3/uL — ABNORMAL LOW (ref 150–400)
RBC: 4.83 MIL/uL (ref 3.87–5.11)
RDW: 13.4 % (ref 11.5–15.5)
WBC Count: 8.7 10*3/uL (ref 4.0–10.5)
nRBC: 0 % (ref 0.0–0.2)

## 2022-10-18 NOTE — Progress Notes (Signed)
  [place] OFFICE PROGRESS NOTE   Diagnosis:  ITP  INTERVAL HISTORY:   Ms. Mathia returns as scheduled.  She underwent right cataract surgery 10/05/2022.  Her vision has improved.  She is scheduled for left cataract surgery next week. The prednisone dose was decreased to 30 mg daily beginning 10/04/2022.  She reports tolerating the prednisone well.  She has a remaining bruise over the right arm.  Good appetite.  No joint pain or swelling.  No other complaint.  Objective:  Vital signs in last 24 hours:  Blood pressure (!) 146/83, pulse 68, temperature 98.2 F (36.8 C), temperature source Oral, resp. rate 20, height _0  (1.651 m), weight 174 lb 9.6 oz (79.2 kg), SpO2 98 %.    HEENT: Mild white coat over the tongue, no buccal thrush Lymphatics: No cervical or supraclavicular nodes Resp: Lungs clear bilaterally Cardio: Regular rate and rhythm GI: No hepatosplenomegaly, nontender Vascular: No leg edema  Skin: Multiple rashes over the lower arms, 1 ecchymosis at the right arm, no petechiae  Lab Results:  Lab Results  Component Value Date   WBC 8.7 10/18/2022   HGB 14.2 10/18/2022   HCT 41.3 10/18/2022   MCV 85.5 10/18/2022   PLT 87 (L) 10/18/2022   NEUTROABS 7.4 10/18/2022    CMP  Lab Results  Component Value Date   NA 137 08/23/2022   K 4.1 08/23/2022   CL 100 08/23/2022   CO2 28 08/23/2022   GLUCOSE 86 08/23/2022   BUN 20 08/23/2022   CREATININE 1.24 (H) 08/23/2022   CALCIUM 10.0 08/23/2022   PROT 7.2 08/23/2022   ALBUMIN 4.4 08/23/2022   AST 22 08/23/2022   ALT 21 08/23/2022   ALKPHOS 55 08/23/2022   BILITOT 0.8 08/23/2022   GFRNONAA 48 (L) 08/23/2022   GFRAA 72 07/28/2020     Medications: I have reviewed the patient's current medications.   Assessment/Plan: Thrombocytopenia-progressive 09/20/2022 Admission 09/20/2022 with severe thrombocytopenia, prednisone 09/20/2022, Solu-Medrol 1 mg/kilogram daily start 09/20/2022 Bone marrow biopsy  09/21/2022-hypercellular marrow for age, increased megakaryocytes, no evidence of a lymphoproliferative disorder Prednisone 80 mg daily 09/22/2022 Prednisone 40 mg daily 09/26/2022 Prednisone 30 mg daily 10/04/2022 Leukopenia History of melanoma located left leg below the knee status post post excision 1998-completed an immunotherapy trial at [facility] Eosinophilic esophagitis Hypertension Anxiety History of colon polyps Hypercholesterolemia  Elevated IgM level Polyclonal hypergammaglobulinemia      Disposition: Ms. Sporer has ITP.  The platelet count has improved while on prednisone.  The platelet count is lower, but adequate today.  She will continue prednisone at a dose of 30 mg daily.  The plan is to continue a slow prednisone taper as tolerated.  The plan is to administer rituximab if the platelet count insistently falls while on the prednisone taper.  She will return for an office visit in 2 weeks.  Betsy Coder, MD  10/18/2022  11:06 AM

## 2022-11-01 ENCOUNTER — Encounter: Payer: Self-pay | Admitting: Nurse Practitioner

## 2022-11-01 ENCOUNTER — Inpatient Hospital Stay (HOSPITAL_BASED_OUTPATIENT_CLINIC_OR_DEPARTMENT_OTHER): Payer: Medicare Other | Admitting: Nurse Practitioner

## 2022-11-01 ENCOUNTER — Inpatient Hospital Stay: Payer: Medicare Other

## 2022-11-01 VITALS — BP 146/78 | HR 60 | Temp 98.2°F | Resp 16 | Wt 177.6 lb

## 2022-11-01 DIAGNOSIS — D696 Thrombocytopenia, unspecified: Secondary | ICD-10-CM

## 2022-11-01 DIAGNOSIS — D693 Immune thrombocytopenic purpura: Secondary | ICD-10-CM | POA: Diagnosis not present

## 2022-11-01 LAB — CBC WITH DIFFERENTIAL (CANCER CENTER ONLY)
Abs Immature Granulocytes: 0.12 10*3/uL — ABNORMAL HIGH (ref 0.00–0.07)
Basophils Absolute: 0 10*3/uL (ref 0.0–0.1)
Basophils Relative: 1 %
Eosinophils Absolute: 0 10*3/uL (ref 0.0–0.5)
Eosinophils Relative: 1 %
HCT: 37.1 % (ref 36.0–46.0)
Hemoglobin: 13 g/dL (ref 12.0–15.0)
Immature Granulocytes: 2 %
Lymphocytes Relative: 22 %
Lymphs Abs: 1.4 10*3/uL (ref 0.7–4.0)
MCH: 30 pg (ref 26.0–34.0)
MCHC: 35 g/dL (ref 30.0–36.0)
MCV: 85.7 fL (ref 80.0–100.0)
Monocytes Absolute: 0.5 10*3/uL (ref 0.1–1.0)
Monocytes Relative: 7 %
Neutro Abs: 4.5 10*3/uL (ref 1.7–7.7)
Neutrophils Relative %: 67 %
Platelet Count: 147 10*3/uL — ABNORMAL LOW (ref 150–400)
RBC: 4.33 MIL/uL (ref 3.87–5.11)
RDW: 13.2 % (ref 11.5–15.5)
WBC Count: 6.6 10*3/uL (ref 4.0–10.5)
nRBC: 0 % (ref 0.0–0.2)

## 2022-11-01 NOTE — Progress Notes (Signed)
  [place] OFFICE PROGRESS NOTE   Diagnosis: ITP  INTERVAL HISTORY:   Ms. Witters returns as scheduled.  Current prednisone dose is 30 mg daily.  She denies bruising/bleeding.  No mouth soreness.  No leg swelling.  Objective:  Vital signs in last 24 hours:  Blood pressure (!) 146/78, pulse 60, temperature 98.2 F (36.8 C), temperature source Temporal, resp. rate 16, weight 177 lb 9.6 oz (80.6 kg), SpO2 99 %.    HEENT: No thrush. Resp: Lungs clear bilaterally. Cardio: Regular rate and rhythm. GI: Abdomen soft and nontender.  No hepatosplenomegaly. Vascular: No leg edema. Skin: Resolving ecchymosis right forearm.  No petechiae.   Lab Results:  Lab Results  Component Value Date   WBC 6.6 11/01/2022   HGB 13.0 11/01/2022   HCT 37.1 11/01/2022   MCV 85.7 11/01/2022   PLT 147 (L) 11/01/2022   NEUTROABS 4.5 11/01/2022    Imaging:  No results found.  Medications: I have reviewed the patient's current medications.  Assessment/Plan: Thrombocytopenia-progressive 09/20/2022 Admission 09/20/2022 with severe thrombocytopenia, prednisone 09/20/2022, Solu-Medrol 1 mg/kilogram daily start 09/20/2022 Bone marrow biopsy 09/21/2022-hypercellular marrow for age, increased megakaryocytes, no evidence of a lymphoproliferative disorder Prednisone 80 mg daily 09/22/2022 Prednisone 40 mg daily 09/26/2022 Prednisone 30 mg daily 10/04/2022 Prednisone 20 mg daily 11/01/2022 Leukopenia History of melanoma located left leg below the knee status post post excision 1998-completed an immunotherapy trial at [facility] Eosinophilic esophagitis Hypertension Anxiety History of colon polyps Hypercholesterolemia  Elevated IgM level Polyclonal hypergammaglobulinemia  Disposition: Ms. Pyeatt remains stable from a hematologic standpoint.  She is currently on prednisone 30 mg daily.  Platelet count is adequate at 147,000.  She will taper prednisone to 20 mg daily.  Plan for repeat CBC in 3  weeks.  We will see her in follow-up in 6 weeks.  She will contact the office with spontaneous bruising/bleeding.  She understands the plan is for rituximab if the platelet count consistently falls while on the prednisone taper.    Ned Card ANP/GNP-BC   11/01/2022  9:55 AM

## 2022-11-22 ENCOUNTER — Inpatient Hospital Stay: Payer: Medicare Other | Attending: Nurse Practitioner

## 2022-11-22 DIAGNOSIS — Z8719 Personal history of other diseases of the digestive system: Secondary | ICD-10-CM | POA: Diagnosis not present

## 2022-11-22 DIAGNOSIS — D693 Immune thrombocytopenic purpura: Secondary | ICD-10-CM | POA: Insufficient documentation

## 2022-11-22 DIAGNOSIS — R252 Cramp and spasm: Secondary | ICD-10-CM | POA: Insufficient documentation

## 2022-11-22 DIAGNOSIS — K200 Eosinophilic esophagitis: Secondary | ICD-10-CM | POA: Diagnosis not present

## 2022-11-22 DIAGNOSIS — D890 Polyclonal hypergammaglobulinemia: Secondary | ICD-10-CM | POA: Diagnosis not present

## 2022-11-22 DIAGNOSIS — E7800 Pure hypercholesterolemia, unspecified: Secondary | ICD-10-CM | POA: Diagnosis not present

## 2022-11-22 DIAGNOSIS — F419 Anxiety disorder, unspecified: Secondary | ICD-10-CM | POA: Insufficient documentation

## 2022-11-22 DIAGNOSIS — D696 Thrombocytopenia, unspecified: Secondary | ICD-10-CM

## 2022-11-22 DIAGNOSIS — Z85820 Personal history of malignant melanoma of skin: Secondary | ICD-10-CM | POA: Diagnosis not present

## 2022-11-22 DIAGNOSIS — I10 Essential (primary) hypertension: Secondary | ICD-10-CM | POA: Insufficient documentation

## 2022-11-22 LAB — CBC WITH DIFFERENTIAL (CANCER CENTER ONLY)
Abs Immature Granulocytes: 0.12 10*3/uL — ABNORMAL HIGH (ref 0.00–0.07)
Basophils Absolute: 0 10*3/uL (ref 0.0–0.1)
Basophils Relative: 0 %
Eosinophils Absolute: 0.1 10*3/uL (ref 0.0–0.5)
Eosinophils Relative: 1 %
HCT: 40 % (ref 36.0–46.0)
Hemoglobin: 13.9 g/dL (ref 12.0–15.0)
Immature Granulocytes: 1 %
Lymphocytes Relative: 12 %
Lymphs Abs: 1.2 10*3/uL (ref 0.7–4.0)
MCH: 29.8 pg (ref 26.0–34.0)
MCHC: 34.8 g/dL (ref 30.0–36.0)
MCV: 85.7 fL (ref 80.0–100.0)
Monocytes Absolute: 0.5 10*3/uL (ref 0.1–1.0)
Monocytes Relative: 5 %
Neutro Abs: 7.6 10*3/uL (ref 1.7–7.7)
Neutrophils Relative %: 81 %
Platelet Count: 158 10*3/uL (ref 150–400)
RBC: 4.67 MIL/uL (ref 3.87–5.11)
RDW: 13.8 % (ref 11.5–15.5)
WBC Count: 9.4 10*3/uL (ref 4.0–10.5)
nRBC: 0 % (ref 0.0–0.2)

## 2022-11-23 ENCOUNTER — Other Ambulatory Visit: Payer: Self-pay

## 2022-11-23 ENCOUNTER — Telehealth: Payer: Self-pay

## 2022-11-23 DIAGNOSIS — D696 Thrombocytopenia, unspecified: Secondary | ICD-10-CM

## 2022-11-23 MED ORDER — PREDNISONE 5 MG PO TABS: 5.0000 mg | ORAL_TABLET | Freq: Every day | ORAL | 0 refills | Status: DC

## 2022-11-23 NOTE — Telephone Encounter (Signed)
I let the patient know to taper off her prednisone to 15 mg and stated she just pick up her 20 mg of prednisone. I advise the patient cut the pill in half and I order her 5 mg of prednisone with 18 qty.

## 2022-11-23 NOTE — Telephone Encounter (Signed)
-----   Message from Owens Shark, NP sent at 11/23/2022  9:03 AM EST ----- Taper prednisone to 15 mg daily, thanks; follow up as scheduled

## 2022-12-12 ENCOUNTER — Inpatient Hospital Stay: Payer: Medicare Other

## 2022-12-12 ENCOUNTER — Other Ambulatory Visit: Payer: Self-pay | Admitting: *Deleted

## 2022-12-12 ENCOUNTER — Inpatient Hospital Stay (HOSPITAL_BASED_OUTPATIENT_CLINIC_OR_DEPARTMENT_OTHER): Payer: Medicare Other | Admitting: Oncology

## 2022-12-12 VITALS — BP 133/79 | HR 76 | Temp 98.1°F | Resp 18 | Ht 65.0 in | Wt 182.8 lb

## 2022-12-12 DIAGNOSIS — D696 Thrombocytopenia, unspecified: Secondary | ICD-10-CM

## 2022-12-12 DIAGNOSIS — D693 Immune thrombocytopenic purpura: Secondary | ICD-10-CM | POA: Diagnosis not present

## 2022-12-12 LAB — CBC WITH DIFFERENTIAL (CANCER CENTER ONLY)
Abs Immature Granulocytes: 0.06 10*3/uL (ref 0.00–0.07)
Basophils Absolute: 0.1 10*3/uL (ref 0.0–0.1)
Basophils Relative: 1 %
Eosinophils Absolute: 0.1 10*3/uL (ref 0.0–0.5)
Eosinophils Relative: 1 %
HCT: 39.5 % (ref 36.0–46.0)
Hemoglobin: 13.8 g/dL (ref 12.0–15.0)
Immature Granulocytes: 1 %
Lymphocytes Relative: 18 %
Lymphs Abs: 1.4 10*3/uL (ref 0.7–4.0)
MCH: 30.3 pg (ref 26.0–34.0)
MCHC: 34.9 g/dL (ref 30.0–36.0)
MCV: 86.6 fL (ref 80.0–100.0)
Monocytes Absolute: 0.5 10*3/uL (ref 0.1–1.0)
Monocytes Relative: 7 %
Neutro Abs: 5.5 10*3/uL (ref 1.7–7.7)
Neutrophils Relative %: 72 %
Platelet Count: 118 10*3/uL — ABNORMAL LOW (ref 150–400)
RBC: 4.56 MIL/uL (ref 3.87–5.11)
RDW: 14.1 % (ref 11.5–15.5)
WBC Count: 7.6 10*3/uL (ref 4.0–10.5)
nRBC: 0 % (ref 0.0–0.2)

## 2022-12-12 LAB — BASIC METABOLIC PANEL - CANCER CENTER ONLY
Anion gap: 9 (ref 5–15)
BUN: 19 mg/dL (ref 8–23)
CO2: 30 mmol/L (ref 22–32)
Calcium: 10.5 mg/dL — ABNORMAL HIGH (ref 8.9–10.3)
Chloride: 98 mmol/L (ref 98–111)
Creatinine: 1.3 mg/dL — ABNORMAL HIGH (ref 0.44–1.00)
GFR, Estimated: 46 mL/min — ABNORMAL LOW (ref >60)
Glucose, Bld: 88 mg/dL (ref 70–99)
Potassium: 3.8 mmol/L (ref 3.5–5.1)
Sodium: 137 mmol/L (ref 135–145)

## 2022-12-12 LAB — MAGNESIUM: Magnesium: 2.5 mg/dL — ABNORMAL HIGH (ref 1.7–2.4)

## 2022-12-12 MED ORDER — PREDNISONE 5 MG PO TABS: 5.0000 mg | ORAL_TABLET | Freq: Every day | ORAL | 0 refills | Status: DC

## 2022-12-12 NOTE — Progress Notes (Signed)
  [facility] OFFICE PROGRESS NOTE   Diagnosis: ITP   INTERVAL HISTORY:   Carol Thomas returns as scheduled.  She continues prednisone at a dose of 15 mg daily.  No bleeding.  She complains of "cramps "in the hands, left leg, and left foot.  The cramps are intermittent.  These have occurred since she was here last month.  She wonders whether the potassium is low.  Objective:  Vital signs in last 24 hours:  Blood pressure 133/79, pulse 76, temperature 98.1 F (36.7 C), temperature source Oral, resp. rate 18, height 5' 5"$  (1.651 m), weight 182 lb 12.8 oz (82.9 kg), SpO2 100 %.    HEENT: No thrush, ecchymosis at the left buccal mucosa (she reports biting her cheek) Resp: Lungs clear bilaterally Cardio: Regular rate and rhythm GI: No hepatosplenomegaly, no mass, nontender Vascular: No leg edema    Lab Results:  Lab Results  Component Value Date   WBC 7.6 12/12/2022   HGB 13.8 12/12/2022   HCT 39.5 12/12/2022   MCV 86.6 12/12/2022   PLT 118 (L) 12/12/2022   NEUTROABS 5.5 12/12/2022    CMP  Lab Results  Component Value Date   NA 137 08/23/2022   K 4.1 08/23/2022   CL 100 08/23/2022   CO2 28 08/23/2022   GLUCOSE 86 08/23/2022   BUN 20 08/23/2022   CREATININE 1.24 (H) 08/23/2022   CALCIUM 10.0 08/23/2022   PROT 7.2 08/23/2022   ALBUMIN 4.4 08/23/2022   AST 22 08/23/2022   ALT 21 08/23/2022   ALKPHOS 55 08/23/2022   BILITOT 0.8 08/23/2022   GFRNONAA 48 (L) 08/23/2022   GFRAA 72 07/28/2020   Medications: I have reviewed the patient's current medications.   Assessment/Plan: Thrombocytopenia-progressive 09/20/2022 Admission 09/20/2022 with severe thrombocytopenia, prednisone 09/20/2022, Solu-Medrol 1 mg/kilogram daily start 09/20/2022 Bone marrow biopsy 09/21/2022-hypercellular marrow for age, increased megakaryocytes, no evidence of a lymphoproliferative disorder Prednisone 80 mg daily 09/22/2022 Prednisone 40 mg daily 09/26/2022 Prednisone 30 mg daily  10/04/2022 Prednisone 20 mg daily 11/01/2022 Prednisone 15 mg daily 11/23/2022 Leukopenia History of melanoma located left leg below the knee status post post excision 1998-completed an immunotherapy trial at [facility] Eosinophilic esophagitis Hypertension Anxiety History of colon polyps Hypercholesterolemia  Elevated IgM level Polyclonal hypergammaglobulinemia    Disposition: Carol Thomas appears stable.  The platelet count is mildly decreased.  She will continue prednisone at current dose.  She will return for a CBC in 3 weeks.  Will decrease the prednisone to 10 mg daily if the platelet count is stable in 3 weeks.  She will be scheduled for office visit in 2-3 months.  She complains of hand, leg, and foot cramps intermittently.  We will check a chemistry panel and magnesium today.  Betsy Coder, MD  12/12/2022  12:05 PM

## 2022-12-13 ENCOUNTER — Telehealth: Payer: Self-pay

## 2022-12-13 NOTE — Telephone Encounter (Signed)
-----   Message from Ladell Pier, MD sent at 12/12/2022  2:32 PM EST ----- Please call patient, the potassium and magnesium levels are fine, mild elevation of the creatinine could indicate mild renal insufficiency, the calcium is mildly elevated-potentially related to her potassium supplement, repeat a CMP and magnesium level with the next lab, asked her to hold the calcium supplement

## 2022-12-13 NOTE — Telephone Encounter (Signed)
Patient gave verbal understanding and had no further questions or concerns  

## 2023-01-03 ENCOUNTER — Inpatient Hospital Stay: Payer: Medicare Other | Attending: Nurse Practitioner

## 2023-01-03 ENCOUNTER — Telehealth: Payer: Self-pay | Admitting: *Deleted

## 2023-01-03 DIAGNOSIS — D696 Thrombocytopenia, unspecified: Secondary | ICD-10-CM

## 2023-01-03 DIAGNOSIS — D693 Immune thrombocytopenic purpura: Secondary | ICD-10-CM | POA: Diagnosis not present

## 2023-01-03 LAB — CBC WITH DIFFERENTIAL (CANCER CENTER ONLY)
Abs Immature Granulocytes: 0.06 10*3/uL (ref 0.00–0.07)
Basophils Absolute: 0 10*3/uL (ref 0.0–0.1)
Basophils Relative: 1 %
Eosinophils Absolute: 0.1 10*3/uL (ref 0.0–0.5)
Eosinophils Relative: 1 %
HCT: 39.7 % (ref 36.0–46.0)
Hemoglobin: 13.5 g/dL (ref 12.0–15.0)
Immature Granulocytes: 1 %
Lymphocytes Relative: 22 %
Lymphs Abs: 1.2 10*3/uL (ref 0.7–4.0)
MCH: 30 pg (ref 26.0–34.0)
MCHC: 34 g/dL (ref 30.0–36.0)
MCV: 88.2 fL (ref 80.0–100.0)
Monocytes Absolute: 0.4 10*3/uL (ref 0.1–1.0)
Monocytes Relative: 7 %
Neutro Abs: 3.7 10*3/uL (ref 1.7–7.7)
Neutrophils Relative %: 68 %
Platelet Count: 131 10*3/uL — ABNORMAL LOW (ref 150–400)
RBC: 4.5 MIL/uL (ref 3.87–5.11)
RDW: 14 % (ref 11.5–15.5)
WBC Count: 5.5 10*3/uL (ref 4.0–10.5)
nRBC: 0 % (ref 0.0–0.2)

## 2023-01-03 NOTE — Telephone Encounter (Signed)
-----   Message from Ladell Pier, MD sent at 01/03/2023  4:26 PM EDT ----- Please call patient, the platelet count is improved, decrease prednisone to 10 mg daily, repeat CBC in 3-4 weeks, follow-up as scheduled

## 2023-01-03 NOTE — Telephone Encounter (Signed)
Notified of improved platelet count and can decrease prednisone to 10 mg daily. She has plenty of 20 mg tabs to break in half. Repeat lab in 3-4 weeks. Scheduler will call. She reports over last 3 days had developed hives on left arm from elbow to palm of hand that comes and goes. Does not itch. Asking if MD has any thought on what this is?

## 2023-01-04 ENCOUNTER — Telehealth: Payer: Self-pay | Admitting: *Deleted

## 2023-01-04 NOTE — Telephone Encounter (Signed)
Informed patient that Dr. Benay Spice does not think the intermittent hives on her arm are related to the prednisone or her thrombocytopenia. F/U with primary care if it persists or worsens. Suggested she try an OTC allergy medication such as allegra.

## 2023-01-24 ENCOUNTER — Inpatient Hospital Stay: Payer: Medicare Other | Attending: Nurse Practitioner

## 2023-01-24 DIAGNOSIS — D693 Immune thrombocytopenic purpura: Secondary | ICD-10-CM | POA: Insufficient documentation

## 2023-01-24 DIAGNOSIS — D696 Thrombocytopenia, unspecified: Secondary | ICD-10-CM

## 2023-01-24 LAB — CBC WITH DIFFERENTIAL (CANCER CENTER ONLY)
Abs Immature Granulocytes: 0.02 10*3/uL (ref 0.00–0.07)
Basophils Absolute: 0 10*3/uL (ref 0.0–0.1)
Basophils Relative: 1 %
Eosinophils Absolute: 0.1 10*3/uL (ref 0.0–0.5)
Eosinophils Relative: 2 %
HCT: 39.5 % (ref 36.0–46.0)
Hemoglobin: 13.8 g/dL (ref 12.0–15.0)
Immature Granulocytes: 0 %
Lymphocytes Relative: 27 %
Lymphs Abs: 1.3 10*3/uL (ref 0.7–4.0)
MCH: 30.7 pg (ref 26.0–34.0)
MCHC: 34.9 g/dL (ref 30.0–36.0)
MCV: 87.8 fL (ref 80.0–100.0)
Monocytes Absolute: 0.4 10*3/uL (ref 0.1–1.0)
Monocytes Relative: 8 %
Neutro Abs: 3 10*3/uL (ref 1.7–7.7)
Neutrophils Relative %: 62 %
Platelet Count: 138 10*3/uL — ABNORMAL LOW (ref 150–400)
RBC: 4.5 MIL/uL (ref 3.87–5.11)
RDW: 13.3 % (ref 11.5–15.5)
WBC Count: 4.8 10*3/uL (ref 4.0–10.5)
nRBC: 0 % (ref 0.0–0.2)

## 2023-01-26 ENCOUNTER — Other Ambulatory Visit: Payer: Self-pay | Admitting: *Deleted

## 2023-01-26 ENCOUNTER — Other Ambulatory Visit: Payer: Self-pay | Admitting: Family Medicine

## 2023-01-26 DIAGNOSIS — D696 Thrombocytopenia, unspecified: Secondary | ICD-10-CM

## 2023-01-26 DIAGNOSIS — I10 Essential (primary) hypertension: Secondary | ICD-10-CM

## 2023-01-26 MED ORDER — PREDNISONE 5 MG PO TABS: 5.0000 mg | ORAL_TABLET | Freq: Every day | ORAL | 1 refills | Status: DC

## 2023-01-26 NOTE — Telephone Encounter (Signed)
Patient requested script for the 5 mg prednisone be called in.

## 2023-02-13 ENCOUNTER — Other Ambulatory Visit: Payer: Medicare Other

## 2023-02-13 ENCOUNTER — Telehealth: Payer: Self-pay | Admitting: *Deleted

## 2023-02-13 DIAGNOSIS — I10 Essential (primary) hypertension: Secondary | ICD-10-CM

## 2023-02-13 DIAGNOSIS — Z5181 Encounter for therapeutic drug level monitoring: Secondary | ICD-10-CM

## 2023-02-13 DIAGNOSIS — E7800 Pure hypercholesterolemia, unspecified: Secondary | ICD-10-CM

## 2023-02-13 LAB — LIPID PANEL
Chol/HDL Ratio: 2.2 ratio (ref 0.0–4.4)
HDL: 72 mg/dL (ref >39)
LDL Chol Calc (NIH): 74 mg/dL (ref 0–99)

## 2023-02-13 NOTE — Telephone Encounter (Signed)
Patient came in for labs today, I am assuming for her med check Thursday. Need orders, thanks.

## 2023-02-13 NOTE — Telephone Encounter (Signed)
Future orders were entered, please release. This should NOT have been scheduled in November without sending back to me.

## 2023-02-14 LAB — COMPREHENSIVE METABOLIC PANEL
ALT: 19 IU/L (ref 0–32)
AST: 23 IU/L (ref 0–40)
Albumin/Globulin Ratio: 2 (ref 1.2–2.2)
Albumin: 4.3 g/dL (ref 3.9–4.9)
Alkaline Phosphatase: 66 IU/L (ref 44–121)
BUN/Creatinine Ratio: 11 — ABNORMAL LOW (ref 12–28)
BUN: 12 mg/dL (ref 8–27)
Bilirubin Total: 0.8 mg/dL (ref 0.0–1.2)
CO2: 24 mmol/L (ref 20–29)
Calcium: 10.1 mg/dL (ref 8.7–10.3)
Chloride: 99 mmol/L (ref 96–106)
Creatinine, Ser: 1.11 mg/dL — ABNORMAL HIGH (ref 0.57–1.00)
Globulin, Total: 2.1 g/dL (ref 1.5–4.5)
Glucose: 109 mg/dL — ABNORMAL HIGH (ref 70–99)
Potassium: 4.7 mmol/L (ref 3.5–5.2)
Sodium: 139 mmol/L (ref 134–144)
Total Protein: 6.4 g/dL (ref 6.0–8.5)
eGFR: 55 mL/min/{1.73_m2} — ABNORMAL LOW (ref >59)

## 2023-02-14 LAB — LIPID PANEL
Cholesterol, Total: 160 mg/dL (ref 100–199)
Triglycerides: 71 mg/dL (ref 0–149)
VLDL Cholesterol Cal: 14 mg/dL (ref 5–40)

## 2023-02-14 LAB — MAGNESIUM: Magnesium: 2.2 mg/dL (ref 1.6–2.3)

## 2023-02-14 NOTE — Progress Notes (Unsigned)
No chief complaint on file.  Patient presents for 6 month follow-up on chronic problems. See below for labs done prior to the visit.  Hypertension. She continues on amlodipine 10mg  and losartan-HCT 100-25.  She denies side effects to medications. She occasionally has a mild headache. She denies dizziness, chest pain, palpitations, shortness of breath, edema (rare). Denies exertional chest pain or dyspnea. BP's are running  BP Readings from Last 3 Encounters:  12/12/22 133/79  11/01/22 (!) 146/78  10/18/22 (!) 146/83     Anxiety: She continues on citalopram 20mg .  She still cares for her mother-in-law in her home (who had a stroke, with deficits in speech, balance, and has some dementia, which is worsening).  MIL goes 4 days/week to [employer].  At her last visit in November, stressors also included her son and grandkids living with her.  Son had been staying at his girlfriend's, but his 2 kids were with her frequently, she never knows when, not given notice, last minute has to take them to school, etc. She knows what she needs to do (setting boundaries with son, etc), but doesn't do it.  She doesn't want her grandkids to suffer. UPDATE  GAD-7 in 08/2022 was 12, and PHQ-9 was 9. She declined changes in medications.  Counseling was encouraged.  Today she reports.    Thrombocytopenia/ITP:  under the care of Dr. Truett Perna, last seen the end of 11/2022. At that time she was on prednisone 15mg  daily.. This had been gradually tapered.  Plt were a little low at 118, so dose was kept at 15mg . CBC was repeated in 3 weeks, and plt count was 131. Prednisone dose was further tapered to 10mg  daily.  Repeat CBC in April showed stable platelets at 138, and dose was decreased further to 5mg . At her 11/2022 visit she reported frequent cramps in hands, L leg and L foot.  Mg was noted to be elevated at 2.5 at that time.  K+ was normal.  Cr bumped to 1.3, and Ca was 10.5. She was asked to hold her Ca supplement.   Was to have c-met and Mg rechecked with next labs.  Future orders hadn't been placed for these, has only had CBC's rechecked.  These labs were done prior to visit, see below.  Still cramping?  Component Ref Range & Units 3 wk ago (01/24/23) 1 mo ago (01/03/23) 2 mo ago (12/12/22) 2 mo ago (11/22/22) 3 mo ago (11/01/22) 3 mo ago (10/18/22) 4 mo ago (10/04/22)  WBC Count 4.0 - 10.5 K/uL 4.8 5.5 7.6 9.4 6.6 8.7 6.8  RBC 3.87 - 5.11 MIL/uL 4.50 4.50 4.56 4.67 4.33 4.83 4.45  Hemoglobin 12.0 - 15.0 g/dL 16.1 09.6 04.5 40.9 81.1 14.2 13.1  HCT 36.0 - 46.0 % 39.5 39.7 39.5 40.0 37.1 41.3 37.8  MCV 80.0 - 100.0 fL 87.8 88.2 86.6 85.7 85.7 85.5 84.9  MCH 26.0 - 34.0 pg 30.7 30.0 30.3 29.8 30.0 29.4 29.4  MCHC 30.0 - 36.0 g/dL 91.4 78.2 95.6 21.3 08.6 34.4 34.7  RDW 11.5 - 15.5 % 13.3 14.0 14.1 13.8 13.2 13.4 13.5  Platelet Count 150 - 400 K/uL 138 Low  131 Low  118 Low  158 147 Low  87 Low  177    Hyperlipidemia: Patient's lipids had been well controlled on rosuvastatin. She had been concerned that her platelets might be low related to rosuvastatin, so was switched to pravastatin. Lipids were high, and platelets didn't improve.  She has been back on the rosuvastatin.  She denies side effects--no myalgias. She continues to follow a low cholesterol diet.  Component Ref Range & Units 1 d ago (02/13/23) 6 mo ago (08/11/22) 8 mo ago (06/02/22) 1 yr ago (02/02/22) 1 yr ago (08/11/21) 1 yr ago (04/04/21) 2 yr ago (11/29/20)  Cholesterol, Total 100 - 199 mg/dL 161 096 045 409 High  811 High  223 High  245 High   Triglycerides 0 - 149 mg/dL 71 55 46 58 71 38 66  HDL >39 mg/dL 72 70 69 70 71 72 71  VLDL Cholesterol Cal 5 - 40 mg/dL 14 10 10 10 13 6 11   LDL Chol Calc (NIH) 0 - 99 mg/dL 74 914 High  68 782 High  132 High  145 High  163 High   Chol/HDL Ratio 0.0 - 4.4 ratio 2.2 2.8 CM 2.1 CM 3.2 CM 3.0 CM 3.1 CM 3.5 CM     EOE, GERD:  diagnosed/treated by Dr. Marca Ancona.  She is taking PPI. Denies  dysphagia.  She reports having regurgitation sometimes after working out, though not as bad as in the past.  H/o Impaired fasting glucose:  Fasting sugar was 107 in October 2022, 105 in 01/2022. Her recent glucose was 109.  She has been taking prednisone, dose tapered down. She tries to limit sugar/sweets/carbs.  Chips and pasta??   Osteopenia: 12/2021 DEXA showed T-2.3 R fem neck; FRAX 19.3/3.5%. Prior DEXA was in 2021--T-2.4 at L fem neck in 2021. FRAX was normal.   Alendronate was started in April 2023. She reports tolerating this without side effects, no chest pain. She used to take a calcium supplement daily, but was told by Dr. Myrle Sheng to stop this. She continues to take vitamin D and gets regular weight bearing exercise.   H/o vitamin D deficiency: level was low at 19 in 2016.  Last check was normal at 49.8 in 07/2021, when taking 2000 IU daily.  She continues on same vitamins.     PMH, PSH, SH reviewed    ROS:   PHYSICAL EXAM:  There were no vitals taken for this visit.  Wt Readings from Last 3 Encounters:  12/12/22 182 lb 12.8 oz (82.9 kg)  11/01/22 177 lb 9.6 oz (80.6 kg)  10/18/22 174 lb 9.6 oz (79.2 kg)       Chemistry      Component Value Date/Time   NA 139 02/13/2023 0943   K 4.7 02/13/2023 0943   CL 99 02/13/2023 0943   CO2 24 02/13/2023 0943   BUN 12 02/13/2023 0943   CREATININE 1.11 (H) 02/13/2023 0943   CREATININE 1.30 (H) 12/12/2022 1242   CREATININE 1.06 (H) 10/22/2017 0823      Component Value Date/Time   CALCIUM 10.1 02/13/2023 0943   ALKPHOS 66 02/13/2023 0943   AST 23 02/13/2023 0943   AST 22 08/23/2022 1049   ALT 19 02/13/2023 0943   ALT 21 08/23/2022 1049   BILITOT 0.8 02/13/2023 0943   BILITOT 0.8 08/23/2022 1049     Fasting glu 109  Lab Results  Component Value Date   CHOL 160 02/13/2023   HDL 72 02/13/2023   LDLCALC 74 02/13/2023   TRIG 71 02/13/2023   CHOLHDL 2.2 02/13/2023   Mg level 2.2 (normal; prior was 2.5 (high) in  11/2022)   ASSESSMENT/PLAN:  PHQ-9 and GAD-7  A1c (elevated glu, pt on prednisone) Offer COVID booster I think hematologist told her to stop her calcium?  See if she is taking it still or  not.   RF amlodipine and losartan HCT  F/u 6 mos AWV/med check Would NOT order labs to be done prior--need to see what other providers have done. Lipids won't be needed. Might only need A1c? She can call the week prior to have [place] determine if/what labs are needed, but NOT to schedule a lab visit.

## 2023-02-15 ENCOUNTER — Encounter: Payer: Self-pay | Admitting: Family Medicine

## 2023-02-15 ENCOUNTER — Ambulatory Visit (INDEPENDENT_AMBULATORY_CARE_PROVIDER_SITE_OTHER): Payer: Medicare Other | Admitting: Family Medicine

## 2023-02-15 VITALS — BP 132/82 | HR 80 | Ht 65.0 in | Wt 188.6 lb

## 2023-02-15 DIAGNOSIS — E7800 Pure hypercholesterolemia, unspecified: Secondary | ICD-10-CM

## 2023-02-15 DIAGNOSIS — D693 Immune thrombocytopenic purpura: Secondary | ICD-10-CM

## 2023-02-15 DIAGNOSIS — F411 Generalized anxiety disorder: Secondary | ICD-10-CM

## 2023-02-15 DIAGNOSIS — I10 Essential (primary) hypertension: Secondary | ICD-10-CM

## 2023-02-15 DIAGNOSIS — R7301 Impaired fasting glucose: Secondary | ICD-10-CM

## 2023-02-15 DIAGNOSIS — I700 Atherosclerosis of aorta: Secondary | ICD-10-CM | POA: Diagnosis not present

## 2023-02-15 DIAGNOSIS — E661 Drug-induced obesity: Secondary | ICD-10-CM

## 2023-02-15 DIAGNOSIS — Z6831 Body mass index (BMI) 31.0-31.9, adult: Secondary | ICD-10-CM

## 2023-02-15 DIAGNOSIS — Z23 Encounter for immunization: Secondary | ICD-10-CM | POA: Diagnosis not present

## 2023-02-15 DIAGNOSIS — K200 Eosinophilic esophagitis: Secondary | ICD-10-CM

## 2023-02-15 LAB — POCT GLYCOSYLATED HEMOGLOBIN (HGB A1C): Hemoglobin A1C: 5.4 % (ref 4.0–5.6)

## 2023-02-15 MED ORDER — LOSARTAN POTASSIUM-HCTZ 100-25 MG PO TABS: 1.0000 | ORAL_TABLET | Freq: Every day | ORAL | 1 refills | Status: DC

## 2023-02-15 MED ORDER — AMLODIPINE BESYLATE 10 MG PO TABS: 10.0000 mg | ORAL_TABLET | Freq: Every day | ORAL | 1 refills | Status: DC

## 2023-02-15 NOTE — Patient Instructions (Addendum)
Consider trying a non-dairy milk as this has more calcium than cow's milk and likely won't bother your stomach. Try and get as much calcium as you can from your diet. If you cannot get at least 1200 mg daily, then you may need to introduce a calcium supplement back into your regimen, to hit a goal of 1200-1500mg  daily.  Continue to work hard on healthy diet, food choices, portion control, in order to help lose some of the weight gained while on prednisone.

## 2023-02-22 ENCOUNTER — Encounter: Payer: Self-pay | Admitting: Nurse Practitioner

## 2023-02-22 ENCOUNTER — Inpatient Hospital Stay: Payer: Medicare Other | Attending: Nurse Practitioner

## 2023-02-22 ENCOUNTER — Inpatient Hospital Stay: Payer: Medicare Other | Admitting: Nurse Practitioner

## 2023-02-22 VITALS — BP 138/77 | HR 79 | Temp 98.2°F | Resp 20 | Ht 65.0 in | Wt 190.3 lb

## 2023-02-22 DIAGNOSIS — D693 Immune thrombocytopenic purpura: Secondary | ICD-10-CM | POA: Insufficient documentation

## 2023-02-22 DIAGNOSIS — Z85820 Personal history of malignant melanoma of skin: Secondary | ICD-10-CM | POA: Insufficient documentation

## 2023-02-22 DIAGNOSIS — D696 Thrombocytopenia, unspecified: Secondary | ICD-10-CM

## 2023-02-22 DIAGNOSIS — D72819 Decreased white blood cell count, unspecified: Secondary | ICD-10-CM | POA: Insufficient documentation

## 2023-02-22 DIAGNOSIS — F419 Anxiety disorder, unspecified: Secondary | ICD-10-CM | POA: Diagnosis not present

## 2023-02-22 DIAGNOSIS — K200 Eosinophilic esophagitis: Secondary | ICD-10-CM | POA: Insufficient documentation

## 2023-02-22 DIAGNOSIS — R768 Other specified abnormal immunological findings in serum: Secondary | ICD-10-CM | POA: Insufficient documentation

## 2023-02-22 DIAGNOSIS — E7800 Pure hypercholesterolemia, unspecified: Secondary | ICD-10-CM | POA: Diagnosis not present

## 2023-02-22 DIAGNOSIS — I10 Essential (primary) hypertension: Secondary | ICD-10-CM | POA: Diagnosis not present

## 2023-02-22 DIAGNOSIS — Z8719 Personal history of other diseases of the digestive system: Secondary | ICD-10-CM | POA: Insufficient documentation

## 2023-02-22 LAB — CBC WITH DIFFERENTIAL (CANCER CENTER ONLY)
Abs Immature Granulocytes: 0.01 10*3/uL (ref 0.00–0.07)
Basophils Absolute: 0 10*3/uL (ref 0.0–0.1)
Basophils Relative: 1 %
Eosinophils Absolute: 0.1 10*3/uL (ref 0.0–0.5)
Eosinophils Relative: 2 %
HCT: 38.5 % (ref 36.0–46.0)
Hemoglobin: 13.2 g/dL (ref 12.0–15.0)
Immature Granulocytes: 0 %
Lymphocytes Relative: 25 %
Lymphs Abs: 0.8 10*3/uL (ref 0.7–4.0)
MCH: 30.1 pg (ref 26.0–34.0)
MCHC: 34.3 g/dL (ref 30.0–36.0)
MCV: 87.9 fL (ref 80.0–100.0)
Monocytes Absolute: 0.3 10*3/uL (ref 0.1–1.0)
Monocytes Relative: 8 %
Neutro Abs: 2 10*3/uL (ref 1.7–7.7)
Neutrophils Relative %: 64 %
Platelet Count: 134 10*3/uL — ABNORMAL LOW (ref 150–400)
RBC: 4.38 MIL/uL (ref 3.87–5.11)
RDW: 12.7 % (ref 11.5–15.5)
WBC Count: 3.2 10*3/uL — ABNORMAL LOW (ref 4.0–10.5)
nRBC: 0 % (ref 0.0–0.2)

## 2023-02-22 NOTE — Progress Notes (Signed)
  [facility] OFFICE PROGRESS NOTE   Diagnosis: ITP  INTERVAL HISTORY:   Ms. Brandley returns as scheduled.  Prednisone was decreased to 10 mg daily beginning 01/03/2023, then 5 mg daily beginning 01/26/2023.  She denies any bleeding.  She notes scattered bruises on the left lower leg.  She has gained weight on steroids.  No leg swelling.  No thrush.  No difficulty sleeping.  Objective:  Vital signs in last 24 hours:  Blood pressure 138/77, pulse 79, temperature 98.2 F (36.8 C), temperature source Oral, resp. rate 20, height 5\' 5"  (1.651 m), weight 190 lb 4.8 oz (86.3 kg), SpO2 100 %.    HEENT: No thrush. Resp: Lungs clear bilaterally. Cardio: Regular rate and rhythm. GI: No hepatosplenomegaly. Vascular: No leg edema. Skin: Resolving ecchymoses at the left lower leg.  Small red indurated skin lesion right posterior neck.  Appears consistent with an insect bite.   Lab Results:  Lab Results  Component Value Date   WBC 3.2 (L) 02/22/2023   HGB 13.2 02/22/2023   HCT 38.5 02/22/2023   MCV 87.9 02/22/2023   PLT 134 (L) 02/22/2023   NEUTROABS 2.0 02/22/2023    Imaging:  No results found.  Medications: I have reviewed the patient's current medications.  Assessment/Plan: Thrombocytopenia-progressive 09/20/2022 Admission 09/20/2022 with severe thrombocytopenia, prednisone 09/20/2022, Solu-Medrol 1 mg/kilogram daily start 09/20/2022 Bone marrow biopsy 09/21/2022-hypercellular marrow for age, increased megakaryocytes, no evidence of a lymphoproliferative disorder Prednisone 80 mg daily 09/22/2022 Prednisone 40 mg daily 09/26/2022 Prednisone 30 mg daily 10/04/2022 Prednisone 20 mg daily 11/01/2022 Prednisone 15 mg daily 11/23/2022 Prednisone 10 mg daily 01/03/2023 Prednisone 5 mg daily 01/26/2023 Prednisone 2.5 mg daily 02/22/2023 Leukopenia History of melanoma located left leg below the knee status post post excision 1998-completed an immunotherapy trial at  [facility] Eosinophilic esophagitis Hypertension Anxiety History of colon polyps Hypercholesterolemia  Elevated IgM level Polyclonal hypergammaglobulinemia    Disposition: Ms. Olivares appears stable.  She has been on prednisone 5 mg daily since 01/26/2023.  Platelet count is stable at 134,000.  She will taper prednisone to 2.5 mg daily and return for a follow-up CBC in 3 weeks.  Lab and office visit in 6 weeks.    Lonna Cobb ANP/GNP-BC   02/22/2023  10:03 AM

## 2023-02-24 LAB — IGM: IgM (Immunoglobulin M), Srm: 492 mg/dL — ABNORMAL HIGH (ref 26–217)

## 2023-02-27 ENCOUNTER — Other Ambulatory Visit: Payer: Self-pay | Admitting: Family Medicine

## 2023-02-27 DIAGNOSIS — Z1231 Encounter for screening mammogram for malignant neoplasm of breast: Secondary | ICD-10-CM

## 2023-02-28 ENCOUNTER — Other Ambulatory Visit: Payer: TRICARE For Life (TFL)

## 2023-02-28 ENCOUNTER — Ambulatory Visit: Payer: TRICARE For Life (TFL) | Admitting: Oncology

## 2023-02-28 ENCOUNTER — Ambulatory Visit
Admission: RE | Admit: 2023-02-28 | Discharge: 2023-02-28 | Disposition: A | Discharge status: Home / Self Care | Payer: Medicare Other | Source: Ambulatory Visit | Attending: Family Medicine | Admitting: Family Medicine

## 2023-02-28 DIAGNOSIS — Z1231 Encounter for screening mammogram for malignant neoplasm of breast: Secondary | ICD-10-CM

## 2023-03-14 ENCOUNTER — Telehealth: Payer: Self-pay | Admitting: *Deleted

## 2023-03-14 ENCOUNTER — Inpatient Hospital Stay: Payer: Medicare Other

## 2023-03-14 DIAGNOSIS — D696 Thrombocytopenia, unspecified: Secondary | ICD-10-CM

## 2023-03-14 DIAGNOSIS — D693 Immune thrombocytopenic purpura: Secondary | ICD-10-CM | POA: Diagnosis not present

## 2023-03-14 LAB — CBC WITH DIFFERENTIAL (CANCER CENTER ONLY)
Abs Immature Granulocytes: 0.01 10*3/uL (ref 0.00–0.07)
Basophils Absolute: 0 10*3/uL (ref 0.0–0.1)
Basophils Relative: 1 %
Eosinophils Absolute: 0.1 10*3/uL (ref 0.0–0.5)
Eosinophils Relative: 3 %
HCT: 39.1 % (ref 36.0–46.0)
Hemoglobin: 13.5 g/dL (ref 12.0–15.0)
Immature Granulocytes: 0 %
Lymphocytes Relative: 22 %
Lymphs Abs: 0.7 10*3/uL (ref 0.7–4.0)
MCH: 29.9 pg (ref 26.0–34.0)
MCHC: 34.5 g/dL (ref 30.0–36.0)
MCV: 86.5 fL (ref 80.0–100.0)
Monocytes Absolute: 0.2 10*3/uL (ref 0.1–1.0)
Monocytes Relative: 6 %
Neutro Abs: 2.2 10*3/uL (ref 1.7–7.7)
Neutrophils Relative %: 68 %
Platelet Count: 56 10*3/uL — ABNORMAL LOW (ref 150–400)
RBC: 4.52 MIL/uL (ref 3.87–5.11)
RDW: 13.2 % (ref 11.5–15.5)
WBC Count: 3.3 10*3/uL — ABNORMAL LOW (ref 4.0–10.5)
nRBC: 0 % (ref 0.0–0.2)

## 2023-03-14 MED ORDER — PREDNISONE 5 MG PO TABS
5.0000 mg | ORAL_TABLET | Freq: Every day | ORAL | 1 refills | Status: DC
Start: 2023-03-14 — End: 2023-04-03

## 2023-03-14 NOTE — Telephone Encounter (Addendum)
Notified of decrease in platelet count and MD instructions to increase prednisone to 5 mg. Follow up as scheduled.

## 2023-03-14 NOTE — Addendum Note (Signed)
Addended by: Wandalee Ferdinand on: 03/14/2023 05:26 PM   Modules accepted: Orders

## 2023-04-03 ENCOUNTER — Other Ambulatory Visit: Payer: Self-pay | Admitting: *Deleted

## 2023-04-03 ENCOUNTER — Inpatient Hospital Stay: Payer: Medicare Other | Attending: Nurse Practitioner

## 2023-04-03 ENCOUNTER — Inpatient Hospital Stay (HOSPITAL_BASED_OUTPATIENT_CLINIC_OR_DEPARTMENT_OTHER): Payer: Medicare Other | Admitting: Oncology

## 2023-04-03 VITALS — BP 140/79 | HR 77 | Temp 98.1°F | Resp 18 | Ht 65.0 in | Wt 192.6 lb

## 2023-04-03 DIAGNOSIS — E7800 Pure hypercholesterolemia, unspecified: Secondary | ICD-10-CM | POA: Diagnosis not present

## 2023-04-03 DIAGNOSIS — F419 Anxiety disorder, unspecified: Secondary | ICD-10-CM | POA: Insufficient documentation

## 2023-04-03 DIAGNOSIS — K200 Eosinophilic esophagitis: Secondary | ICD-10-CM | POA: Diagnosis not present

## 2023-04-03 DIAGNOSIS — D696 Thrombocytopenia, unspecified: Secondary | ICD-10-CM

## 2023-04-03 DIAGNOSIS — R768 Other specified abnormal immunological findings in serum: Secondary | ICD-10-CM | POA: Diagnosis not present

## 2023-04-03 DIAGNOSIS — D693 Immune thrombocytopenic purpura: Secondary | ICD-10-CM | POA: Insufficient documentation

## 2023-04-03 DIAGNOSIS — Z8719 Personal history of other diseases of the digestive system: Secondary | ICD-10-CM | POA: Insufficient documentation

## 2023-04-03 DIAGNOSIS — Z85820 Personal history of malignant melanoma of skin: Secondary | ICD-10-CM | POA: Diagnosis not present

## 2023-04-03 DIAGNOSIS — I10 Essential (primary) hypertension: Secondary | ICD-10-CM | POA: Insufficient documentation

## 2023-04-03 LAB — CBC WITH DIFFERENTIAL (CANCER CENTER ONLY)
Abs Immature Granulocytes: 0.01 10*3/uL (ref 0.00–0.07)
Basophils Absolute: 0 10*3/uL (ref 0.0–0.1)
Basophils Relative: 1 %
Eosinophils Absolute: 0.1 10*3/uL (ref 0.0–0.5)
Eosinophils Relative: 2 %
HCT: 39.2 % (ref 36.0–46.0)
Hemoglobin: 13.6 g/dL (ref 12.0–15.0)
Immature Granulocytes: 0 %
Lymphocytes Relative: 19 %
Lymphs Abs: 0.9 10*3/uL (ref 0.7–4.0)
MCH: 30 pg (ref 26.0–34.0)
MCHC: 34.7 g/dL (ref 30.0–36.0)
MCV: 86.5 fL (ref 80.0–100.0)
Monocytes Absolute: 0.4 10*3/uL (ref 0.1–1.0)
Monocytes Relative: 8 %
Neutro Abs: 3.1 10*3/uL (ref 1.7–7.7)
Neutrophils Relative %: 70 %
Platelet Count: 72 10*3/uL — ABNORMAL LOW (ref 150–400)
RBC: 4.53 MIL/uL (ref 3.87–5.11)
RDW: 13.1 % (ref 11.5–15.5)
WBC Count: 4.5 10*3/uL (ref 4.0–10.5)
nRBC: 0 % (ref 0.0–0.2)

## 2023-04-03 MED ORDER — PREDNISONE 5 MG PO TABS
ORAL_TABLET | ORAL | 0 refills | Status: DC
Start: 2023-04-03 — End: 2023-05-24

## 2023-04-03 NOTE — Progress Notes (Signed)
  [facility] OFFICE PROGRESS NOTE   Diagnosis: ITP  INTERVAL HISTORY:   Carol Thomas returns as scheduled.  She tapered prednisone to 2.5 mg daily on 02/22/2023.  Platelet count was increased to 5 mg daily on 03/14/2023 when the platelets returned lower at 56,000.  No spontaneous bleeding or bruising.  Her appetite remains increased while on prednisone.  Objective:  Vital signs in last 24 hours:  Blood pressure (!) 140/79, pulse 77, temperature 98.1 F (36.7 C), temperature source Oral, resp. rate 18, height 5\' 5"  (1.651 m), weight 192 lb 9.6 oz (87.4 kg), SpO2 100 %.    HEENT: No thrush Lymphatics: No cervical, supraclavicular, axillary, or inguinal nodes Resp: Lungs clear bilaterally Cardio: Regular rate and rhythm GI: No hepatosplenomegaly Vascular: No leg edema   Lab Results:  Lab Results  Component Value Date   WBC 4.5 04/03/2023   HGB 13.6 04/03/2023   HCT 39.2 04/03/2023   MCV 86.5 04/03/2023   PLT 72 (L) 04/03/2023   NEUTROABS 3.1 04/03/2023    CMP  Lab Results  Component Value Date   NA 139 02/13/2023   K 4.7 02/13/2023   CL 99 02/13/2023   CO2 24 02/13/2023   GLUCOSE 109 (H) 02/13/2023   BUN 12 02/13/2023   CREATININE 1.11 (H) 02/13/2023   CALCIUM 10.1 02/13/2023   PROT 6.4 02/13/2023   ALBUMIN 4.3 02/13/2023   AST 23 02/13/2023   ALT 19 02/13/2023   ALKPHOS 66 02/13/2023   BILITOT 0.8 02/13/2023   GFRNONAA 46 (L) 12/12/2022   GFRAA 72 07/28/2020    No results found for: "CEA1", "CEA", "ZOX096", "CA125"  No results found for: "INR", "LABPROT"  Imaging:  No results found.  Medications: I have reviewed the patient's current medications.   Assessment/Plan: Thrombocytopenia-progressive 09/20/2022 Admission 09/20/2022 with severe thrombocytopenia, prednisone 09/20/2022, Solu-Medrol 1 mg/kilogram daily start 09/20/2022 Bone marrow biopsy 09/21/2022-hypercellular marrow for age, increased megakaryocytes, no evidence of a  lymphoproliferative disorder Prednisone 80 mg daily 09/22/2022 Prednisone 40 mg daily 09/26/2022 Prednisone 30 mg daily 10/04/2022 Prednisone 20 mg daily 11/01/2022 Prednisone 15 mg daily 11/23/2022 Prednisone 10 mg daily 01/03/2023 Prednisone 5 mg daily 01/26/2023 Prednisone 2.5 mg daily 02/22/2023 Prednisone 5 mg daily 03/14/2023 Prednisone 5 alternating with 2.5 mg daily 04/03/2023 Leukopenia History of melanoma located left leg below the knee status post post excision 1998-completed an immunotherapy trial at [facility] Eosinophilic esophagitis Hypertension Anxiety History of colon polyps Hypercholesterolemia  Elevated IgM level Polyclonal hypergammaglobulinemia     Disposition: Carol Thomas has ITP.  The platelet count is adequate with prednisone at the current dose.  We will try a slow prednisone taper.  If she is unable to tolerate further taper of the prednisone the plan is to treat with rituximab.  Prednisone will be changed to 5 mg alternating with 2.5 mg daily.  She will return for a CBC in 6 weeks and an office visit in 3 months.  Thornton Papas, MD  04/03/2023  11:25 AM

## 2023-04-28 ENCOUNTER — Other Ambulatory Visit: Payer: Self-pay

## 2023-04-28 DIAGNOSIS — N3001 Acute cystitis with hematuria: Secondary | ICD-10-CM | POA: Diagnosis not present

## 2023-04-28 DIAGNOSIS — R350 Frequency of micturition: Secondary | ICD-10-CM | POA: Diagnosis present

## 2023-04-28 LAB — URINALYSIS, ROUTINE W REFLEX MICROSCOPIC
Bacteria, UA: NONE SEEN
Bilirubin Urine: NEGATIVE
Glucose, UA: NEGATIVE mg/dL
Ketones, ur: NEGATIVE mg/dL
Nitrite: NEGATIVE
Protein, ur: 30 mg/dL — AB
RBC / HPF: >50 RBC/hpf (ref 0–5)
Specific Gravity, Urine: 1.014 (ref 1.005–1.030)
WBC, UA: >50 WBC/hpf (ref 0–5)
pH: 5.5 (ref 5.0–8.0)

## 2023-04-28 NOTE — ED Triage Notes (Signed)
Reports epigastric pain and lower abd pain with urination. Urgency and frequency noted today. HX EOE, low plt count (ITP). Denies CP, SOB, or flank pain.

## 2023-04-29 ENCOUNTER — Emergency Department (HOSPITAL_BASED_OUTPATIENT_CLINIC_OR_DEPARTMENT_OTHER)
Admission: EM | Admit: 2023-04-29 | Discharge: 2023-04-29 | Disposition: A | Discharge status: Home / Self Care | Payer: Medicare Other | Attending: Emergency Medicine | Admitting: Emergency Medicine

## 2023-04-29 DIAGNOSIS — N3001 Acute cystitis with hematuria: Secondary | ICD-10-CM

## 2023-04-29 LAB — CBC WITH DIFFERENTIAL/PLATELET
Abs Immature Granulocytes: 0.01 10*3/uL (ref 0.00–0.07)
Basophils Absolute: 0 10*3/uL (ref 0.0–0.1)
Basophils Relative: 0 %
Eosinophils Absolute: 0.1 10*3/uL (ref 0.0–0.5)
Eosinophils Relative: 2 %
HCT: 33.6 % — ABNORMAL LOW (ref 36.0–46.0)
Hemoglobin: 11.6 g/dL — ABNORMAL LOW (ref 12.0–15.0)
Immature Granulocytes: 0 %
Lymphocytes Relative: 14 %
Lymphs Abs: 0.7 10*3/uL (ref 0.7–4.0)
MCH: 29.4 pg (ref 26.0–34.0)
MCHC: 34.5 g/dL (ref 30.0–36.0)
MCV: 85.1 fL (ref 80.0–100.0)
Monocytes Absolute: 0.4 10*3/uL (ref 0.1–1.0)
Monocytes Relative: 7 %
Neutro Abs: 4 10*3/uL (ref 1.7–7.7)
Neutrophils Relative %: 77 %
Platelets: 101 10*3/uL — ABNORMAL LOW (ref 150–400)
RBC: 3.95 MIL/uL (ref 3.87–5.11)
RDW: 13.2 % (ref 11.5–15.5)
WBC: 5.2 10*3/uL (ref 4.0–10.5)
nRBC: 0 % (ref 0.0–0.2)

## 2023-04-29 LAB — BASIC METABOLIC PANEL
Anion gap: 8 (ref 5–15)
BUN: 19 mg/dL (ref 8–23)
CO2: 28 mmol/L (ref 22–32)
Calcium: 9.4 mg/dL (ref 8.9–10.3)
Chloride: 94 mmol/L — ABNORMAL LOW (ref 98–111)
Creatinine, Ser: 1.09 mg/dL — ABNORMAL HIGH (ref 0.44–1.00)
GFR, Estimated: 56 mL/min — ABNORMAL LOW (ref >60)
Glucose, Bld: 114 mg/dL — ABNORMAL HIGH (ref 70–99)
Potassium: 3.4 mmol/L — ABNORMAL LOW (ref 3.5–5.1)
Sodium: 130 mmol/L — ABNORMAL LOW (ref 135–145)

## 2023-04-29 MED ORDER — CEPHALEXIN 500 MG PO CAPS: 500.0000 mg | ORAL_CAPSULE | Freq: Three times a day (TID) | ORAL | 0 refills | Status: DC

## 2023-04-29 MED ORDER — CEPHALEXIN 250 MG PO CAPS
500.0000 mg | ORAL_CAPSULE | Freq: Once | ORAL | Status: AC
  Administered 2023-04-29: 500 mg via ORAL
  Filled 2023-04-29: qty 2

## 2023-04-29 NOTE — ED Provider Notes (Signed)
[place] EMERGENCY DEPARTMENT AT [facility] Provider Note   CSN: [id number] Arrival date & time: 04/28/23  2239     History  Chief Complaint  Patient presents with   Urinary Frequency    Carol Thomas is a 67 y.o. female.  HPI     This is a 67 year old female who presents with dysuria.  Patient reports 1 day history of worsening suprapubic discomfort and pain with urination.  Has not noted any hematuria.  She reports urgency and frequency.  She denies any flank pain or fevers.  She has a history of EOE and ITP.  She is concerned her platelets may be low.  Denies chest pain or shortness of breath.  Home Medications Prior to Admission medications   Medication Sig Start Date End Date Taking? Authorizing Provider  cephALEXin (KEFLEX) 500 MG capsule Take 1 capsule (500 mg total) by mouth 3 (three) times daily. 04/29/23  Yes Chloe Baig, Mayer Masker, MD  alendronate (FOSAMAX) 70 MG tablet Take 1 tablet (70 mg total) by mouth every 7 (seven) days. Take with a full glass of water on an empty stomach. Patient taking differently: Take 70 mg by mouth See admin instructions. Take 70 mg by mouth every Sunday with a full glass of water on an empty stomach 08/17/22   Joselyn Arrow, MD  amLODipine (NORVASC) 10 MG tablet Take 1 tablet (10 mg total) by mouth daily. 02/15/23   Joselyn Arrow, MD  Cholecalciferol (VITAMIN D3) 50 MCG (2000 UT) TABS Take 2,000 Units by mouth in the morning.    [provider]  citalopram (CELEXA) 20 MG tablet Take 1 tablet (20 mg total) by mouth daily. 08/17/22   Joselyn Arrow, MD  co-enzyme Q-10 30 MG capsule Take 30 mg by mouth daily.    [provider]  losartan-hydrochlorothiazide (HYZAAR) 100-25 MG tablet Take 1 tablet by mouth daily. 02/15/23   Joselyn Arrow, MD  pantoprazole (PROTONIX) 40 MG tablet Take 40 mg by mouth daily before breakfast. 06/13/17   [provider]  predniSONE (DELTASONE) 5 MG tablet Take 5 mg alternating with 2.5 mg daily 04/03/23    Ladene Artist, MD  rosuvastatin (CRESTOR) 10 MG tablet Take 1 tablet (10 mg total) by mouth daily. 08/17/22   Joselyn Arrow, MD  SYSTANE COMPLETE PF 0.6 % SOLN Place 1 drop into both eyes 3 (three) times daily as needed (for dryness or irritation).    [provider]      Allergies    Other    Review of Systems   Review of Systems  Constitutional:  Negative for fever.  Respiratory:  Negative for shortness of breath.   Cardiovascular:  Negative for chest pain.  Gastrointestinal:  Negative for abdominal pain, nausea and vomiting.  Genitourinary:  Positive for dysuria, frequency and urgency. Negative for flank pain.  All other systems reviewed and are negative.   Physical Exam Updated Vital Signs Ht 1.664 m (5' 5.5")   Wt 86.6 kg   BMI 31.30 kg/m  Physical Exam Vitals and nursing note reviewed.  Constitutional:      Appearance: She is well-developed. She is not ill-appearing.  HENT:     Head: Normocephalic and atraumatic.  Eyes:     Pupils: Pupils are equal, round, and reactive to light.  Cardiovascular:     Rate and Rhythm: Normal rate and regular rhythm.     Heart sounds: Normal heart sounds.  Pulmonary:     Effort: Pulmonary effort is normal. No  respiratory distress.     Breath sounds: No wheezing.  Abdominal:     General: Bowel sounds are normal.     Palpations: Abdomen is soft.     Tenderness: There is no abdominal tenderness. There is no guarding or rebound.  Musculoskeletal:     Cervical back: Neck supple.  Skin:    General: Skin is warm and dry.  Neurological:     Mental Status: She is alert and oriented to person, place, and time.  Psychiatric:        Mood and Affect: Mood normal.     ED Results / Procedures / Treatments   Labs (all labs ordered are listed, but only abnormal results are displayed) Labs Reviewed  URINALYSIS, ROUTINE W REFLEX MICROSCOPIC - Abnormal; Notable for the following components:      Result Value   APPearance HAZY (*)     Hgb urine dipstick LARGE (*)    Protein, ur 30 (*)    Leukocytes,Ua LARGE (*)    All other components within normal limits  CBC WITH DIFFERENTIAL/PLATELET - Abnormal; Notable for the following components:   Hemoglobin 11.6 (*)    HCT 33.6 (*)    Platelets 101 (*)    All other components within normal limits  BASIC METABOLIC PANEL - Abnormal; Notable for the following components:   Sodium 130 (*)    Potassium 3.4 (*)    Chloride 94 (*)    Glucose, Bld 114 (*)    Creatinine, Ser 1.09 (*)    GFR, Estimated 56 (*)    All other components within normal limits  URINE CULTURE    EKG None  Radiology No results found.  Procedures Procedures    Medications Ordered in ED Medications  cephALEXin (KEFLEX) capsule 500 mg (500 mg Oral Given 04/29/23 0028)    ED Course/ Medical Decision Making/ A&P                             Medical Decision Making Amount and/or Complexity of Data Reviewed Labs: ordered.  Risk Prescription drug management.   This patient presents to the ED for concern of urinary symptoms, this involves an extensive number of treatment options, and is a complaint that carries with it a high risk of complications and morbidity.  I considered the following differential and admission for this acute, potentially life threatening condition.  The differential diagnosis includes cystitis, pyelonephritis, kidney stone  MDM:    This is a 67 year old female who presents with concerns for dysuria.  She is nontoxic and vital signs are reassuring.  She has some suprapubic tenderness to palpation.  No CVA tenderness.  Urinalysis with large leukocyte Estrace, greater than 50 white cells, greater than 50 red cells, no bacteria but it does appear to be a clean specimen.  Will culture and treat given her symptoms.  Patient is concerned that her platelets are low.  For this reason, I did obtain the basic lab work.  Platelets are 101.  She will follow-up with Dr. Truett Perna.  Will  discharge on Keflex.  (Labs, imaging, consults)  Labs: I Ordered, and personally interpreted labs.  The pertinent results include: CBC, BMP, urinalysis  Imaging Studies ordered: I ordered imaging studies including none I independently visualized and interpreted imaging. I agree with the radiologist interpretation  Additional history obtained from chart review.  External records from outside source obtained and reviewed including prior evaluations  Cardiac Monitoring: The patient was  maintained on a cardiac monitor.  If on the cardiac monitor, I personally viewed and interpreted the cardiac monitored which showed an underlying rhythm of: Sinus rhythm  Reevaluation: After the interventions noted above, I reevaluated the patient and found that they have :improved  Social Determinants of Health:  lives independently  Disposition: Discharge  Co morbidities that complicate the patient evaluation  Past Medical History:  Diagnosis Date   Adenomatous colon polyp 11/2008   Dr. Evette Cristal at [place]   Anxiety    Cancer [facility]) 1998   Melanoma-Left leg   Hypertension    Osteopenia 10/2012   T score of -2.2 FRAX 8%/1.1%   Vitamin D deficiency 10/2012   vitamin D level 10     Medicines Meds ordered this encounter  Medications   cephALEXin (KEFLEX) capsule 500 mg   cephALEXin (KEFLEX) 500 MG capsule    Sig: Take 1 capsule (500 mg total) by mouth 3 (three) times daily.    Dispense:  21 capsule    Refill:  0    I have reviewed the patients home medicines and have made adjustments as needed  Problem List / ED Course: Problem List Items Addressed This Visit   None Visit Diagnoses     Acute cystitis with hematuria    -  Primary                   Final Clinical Impression(s) / ED Diagnoses Final diagnoses:  Acute cystitis with hematuria    Rx / DC Orders ED Discharge Orders          Ordered    cephALEXin (KEFLEX) 500 MG capsule  3 times daily        04/29/23 0107               Kaleyah Labreck, Mayer Masker, MD 04/29/23 [phone]

## 2023-04-29 NOTE — ED Notes (Signed)
Reviewed AVS with patient, patient expressed understanding of directions, denies further questions at this time. 

## 2023-04-29 NOTE — Discharge Instructions (Addendum)
You are seen today with concern for urinary symptoms.  Your urinalysis is consistent with urinary tract infection.  Your platelets today are 101.

## 2023-05-02 LAB — URINE CULTURE: Culture: >=100000 — AB

## 2023-05-03 ENCOUNTER — Telehealth (HOSPITAL_BASED_OUTPATIENT_CLINIC_OR_DEPARTMENT_OTHER): Payer: Self-pay

## 2023-05-03 NOTE — Telephone Encounter (Signed)
Post ED Visit - Positive Culture Follow-up: Successful Patient Follow-Up  Culture assessed and recommendations reviewed by:  [x]  Daylene Posey, Pharm.D. []  Celedonio Miyamoto, [address].D., BCPS AQ-ID []  Garvin Fila, Pharm.D., BCPS []  Georgina Pillion, Pharm.D., BCPS []  [place], [address].D., BCPS, AAHIVP []  Estella Husk, Pharm.D., BCPS, AAHIVP []  Lysle Pearl, PharmD, BCPS []  Phillips Climes, PharmD, BCPS []  Agapito Games, PharmD, BCPS []  Verlan Friends, PharmD  Positive urine culture  []  Patient discharged without antimicrobial prescription and treatment is now indicated [x]  Organism is resistant to prescribed ED discharge antimicrobial []  Patient with positive blood cultures  Changes discussed with ED provider: Mertha Baars, PA-C New antibiotic prescription Ciproflxacin 250 mg po BID x 5 days Called to [facility] on [address]  Contacted patient, date 05/03/23, time 9:45 am   Sandria Senter 05/03/2023, 9:54 AM

## 2023-05-03 NOTE — Progress Notes (Signed)
ED Antimicrobial Stewardship Positive Culture Follow Up   Carol Thomas is an 67 y.o. female who presented to [facility] on 04/29/2023 with a chief complaint of  Chief Complaint  Patient presents with   Urinary Frequency    Recent Results (from the past 720 hour(s))  Urine Culture     Status: Abnormal   Collection Time: 04/28/23 11:14 PM   Specimen: Urine, Clean Catch  Result Value Ref Range Status   Specimen Description   Final    URINE, CLEAN CATCH Performed at [facility], [address], [place], [place] [address]    Special Requests   Final    NONE Performed at [facility], [address] [place], [place] [address]    Culture >=100,000 COLONIES/mL ENTEROBACTER AEROGENES (A)  Final   Report Status 05/02/2023 FINAL  Final   Organism ID, Bacteria ENTEROBACTER AEROGENES (A)  Final      Susceptibility   Enterobacter aerogenes - MIC*    CEFEPIME <=0.12 SENSITIVE Sensitive     CEFTRIAXONE <=0.25 SENSITIVE Sensitive     CIPROFLOXACIN <=0.25 SENSITIVE Sensitive     GENTAMICIN <=1 SENSITIVE Sensitive     IMIPENEM 2 SENSITIVE Sensitive     NITROFURANTOIN 64 INTERMEDIATE Intermediate     TRIMETH/SULFA <=20 SENSITIVE Sensitive     PIP/TAZO <=4 SENSITIVE Sensitive     * >=100,000 COLONIES/mL ENTEROBACTER AEROGENES    [x]  Treated with keflex, organism resistant to prescribed antimicrobial []  Patient discharged originally without antimicrobial agent and treatment is now indicated  New antibiotic prescription: Cipro  ED Provider: Mertha Baars, Migdalia Dk 05/03/2023, 8:34 AM Clinical Pharmacist Monday - Friday phone -  [phone] Saturday - Sunday phone - [phone]

## 2023-05-07 ENCOUNTER — Inpatient Hospital Stay: Payer: Medicare Other | Attending: Nurse Practitioner

## 2023-05-07 DIAGNOSIS — D693 Immune thrombocytopenic purpura: Secondary | ICD-10-CM | POA: Diagnosis present

## 2023-05-07 DIAGNOSIS — D696 Thrombocytopenia, unspecified: Secondary | ICD-10-CM

## 2023-05-07 LAB — CBC WITH DIFFERENTIAL (CANCER CENTER ONLY)
Abs Immature Granulocytes: 0.01 10*3/uL (ref 0.00–0.07)
Basophils Absolute: 0.1 10*3/uL (ref 0.0–0.1)
Basophils Relative: 1 %
Eosinophils Absolute: 0.1 10*3/uL (ref 0.0–0.5)
Eosinophils Relative: 3 %
HCT: 36.6 % (ref 36.0–46.0)
Hemoglobin: 12.7 g/dL (ref 12.0–15.0)
Immature Granulocytes: 0 %
Lymphocytes Relative: 19 %
Lymphs Abs: 0.7 10*3/uL (ref 0.7–4.0)
MCH: 29.8 pg (ref 26.0–34.0)
MCHC: 34.7 g/dL (ref 30.0–36.0)
MCV: 85.9 fL (ref 80.0–100.0)
Monocytes Absolute: 0.3 10*3/uL (ref 0.1–1.0)
Monocytes Relative: 9 %
Neutro Abs: 2.6 10*3/uL (ref 1.7–7.7)
Neutrophils Relative %: 68 %
Platelet Count: 82 10*3/uL — ABNORMAL LOW (ref 150–400)
RBC: 4.26 MIL/uL (ref 3.87–5.11)
RDW: 12.9 % (ref 11.5–15.5)
WBC Count: 3.8 10*3/uL — ABNORMAL LOW (ref 4.0–10.5)
nRBC: 0 % (ref 0.0–0.2)

## 2023-05-08 ENCOUNTER — Encounter: Payer: Self-pay | Admitting: *Deleted

## 2023-05-09 ENCOUNTER — Encounter: Payer: Self-pay | Admitting: *Deleted

## 2023-05-09 ENCOUNTER — Other Ambulatory Visit: Payer: Self-pay | Admitting: *Deleted

## 2023-05-09 NOTE — Progress Notes (Signed)
MD reviewed CBC results and noted platelet count is stable. Prednisone changed to 2.5 mg daily, except 5 mg on M & Th. F/U as scheduled on 06/27/23. Carol Thomas notified via Mychart message.

## 2023-05-23 ENCOUNTER — Encounter: Payer: Self-pay | Admitting: Nurse Practitioner

## 2023-05-24 ENCOUNTER — Inpatient Hospital Stay: Payer: Medicare Other | Attending: Nurse Practitioner

## 2023-05-24 ENCOUNTER — Telehealth: Payer: Self-pay | Admitting: *Deleted

## 2023-05-24 ENCOUNTER — Other Ambulatory Visit: Payer: Self-pay | Admitting: *Deleted

## 2023-05-24 DIAGNOSIS — D693 Immune thrombocytopenic purpura: Secondary | ICD-10-CM | POA: Insufficient documentation

## 2023-05-24 DIAGNOSIS — D696 Thrombocytopenia, unspecified: Secondary | ICD-10-CM

## 2023-05-24 LAB — CBC WITH DIFFERENTIAL (CANCER CENTER ONLY)
Abs Immature Granulocytes: 0 10*3/uL (ref 0.00–0.07)
Basophils Absolute: 0 10*3/uL (ref 0.0–0.1)
Basophils Relative: 1 %
Eosinophils Absolute: 0.1 10*3/uL (ref 0.0–0.5)
Eosinophils Relative: 2 %
HCT: 36.7 % (ref 36.0–46.0)
Hemoglobin: 12.8 g/dL (ref 12.0–15.0)
Immature Granulocytes: 0 %
Lymphocytes Relative: 17 %
Lymphs Abs: 0.7 10*3/uL (ref 0.7–4.0)
MCH: 29.4 pg (ref 26.0–34.0)
MCHC: 34.9 g/dL (ref 30.0–36.0)
MCV: 84.2 fL (ref 80.0–100.0)
Monocytes Absolute: 0.3 10*3/uL (ref 0.1–1.0)
Monocytes Relative: 9 %
Neutro Abs: 2.7 10*3/uL (ref 1.7–7.7)
Neutrophils Relative %: 71 %
Platelet Count: 27 10*3/uL — ABNORMAL LOW (ref 150–400)
RBC: 4.36 MIL/uL (ref 3.87–5.11)
RDW: 13.2 % (ref 11.5–15.5)
WBC Count: 3.8 10*3/uL — ABNORMAL LOW (ref 4.0–10.5)
nRBC: 0 % (ref 0.0–0.2)

## 2023-05-24 MED ORDER — PREDNISONE 20 MG PO TABS: 20.0000 mg | ORAL_TABLET | ORAL | 0 refills | Status: DC

## 2023-05-24 NOTE — Telephone Encounter (Signed)
Per Dr. Truett Perna: Increase Prednisone to 40 mg daily x 3 days, then 20 mg daily. Recheck CBC on 8/12. Patient notified and new script [facility].

## 2023-05-25 ENCOUNTER — Encounter: Payer: Self-pay | Admitting: *Deleted

## 2023-05-28 ENCOUNTER — Inpatient Hospital Stay: Payer: Medicare Other

## 2023-05-28 DIAGNOSIS — D693 Immune thrombocytopenic purpura: Secondary | ICD-10-CM | POA: Diagnosis not present

## 2023-05-28 DIAGNOSIS — D696 Thrombocytopenia, unspecified: Secondary | ICD-10-CM

## 2023-05-28 LAB — CBC WITH DIFFERENTIAL (CANCER CENTER ONLY)
Abs Immature Granulocytes: 0.02 10*3/uL (ref 0.00–0.07)
Basophils Absolute: 0 10*3/uL (ref 0.0–0.1)
Basophils Relative: 0 %
Eosinophils Absolute: 0 10*3/uL (ref 0.0–0.5)
Eosinophils Relative: 1 %
HCT: 37.3 % (ref 36.0–46.0)
Hemoglobin: 13.3 g/dL (ref 12.0–15.0)
Immature Granulocytes: 0 %
Lymphocytes Relative: 25 %
Lymphs Abs: 1.5 10*3/uL (ref 0.7–4.0)
MCH: 29.6 pg (ref 26.0–34.0)
MCHC: 35.7 g/dL (ref 30.0–36.0)
MCV: 82.9 fL (ref 80.0–100.0)
Monocytes Absolute: 0.2 10*3/uL (ref 0.1–1.0)
Monocytes Relative: 4 %
Neutro Abs: 4.1 10*3/uL (ref 1.7–7.7)
Neutrophils Relative %: 70 %
Platelet Count: 94 10*3/uL — ABNORMAL LOW (ref 150–400)
RBC: 4.5 MIL/uL (ref 3.87–5.11)
RDW: 13.1 % (ref 11.5–15.5)
WBC Count: 5.9 10*3/uL (ref 4.0–10.5)
nRBC: 0 % (ref 0.0–0.2)

## 2023-05-29 ENCOUNTER — Telehealth: Payer: Self-pay

## 2023-05-29 NOTE — Telephone Encounter (Signed)
Message relayed below to pt per MD Sherrill. Pt verbalizes understanding stating "I will stay on the 20mg  then". Pt verbalizes understanding to call [facility] with further concerns and will follow-up as scheduled.

## 2023-05-29 NOTE — Telephone Encounter (Signed)
-----   Message from Thornton Papas sent at 05/29/2023  3:03 PM EDT ----- Please call patient, platelet count is higher, taper prednisone to 20 mg daily, follow-up as scheduled

## 2023-06-27 ENCOUNTER — Encounter: Payer: Self-pay | Admitting: Oncology

## 2023-06-27 ENCOUNTER — Inpatient Hospital Stay (HOSPITAL_BASED_OUTPATIENT_CLINIC_OR_DEPARTMENT_OTHER): Payer: Medicare Other | Admitting: Oncology

## 2023-06-27 ENCOUNTER — Inpatient Hospital Stay: Payer: Medicare Other | Attending: Nurse Practitioner

## 2023-06-27 VITALS — BP 149/75 | HR 69 | Temp 98.2°F | Resp 18 | Ht 65.0 in | Wt 196.1 lb

## 2023-06-27 DIAGNOSIS — Z7952 Long term (current) use of systemic steroids: Secondary | ICD-10-CM | POA: Insufficient documentation

## 2023-06-27 DIAGNOSIS — Z8719 Personal history of other diseases of the digestive system: Secondary | ICD-10-CM | POA: Diagnosis not present

## 2023-06-27 DIAGNOSIS — F419 Anxiety disorder, unspecified: Secondary | ICD-10-CM | POA: Insufficient documentation

## 2023-06-27 DIAGNOSIS — E7800 Pure hypercholesterolemia, unspecified: Secondary | ICD-10-CM | POA: Diagnosis not present

## 2023-06-27 DIAGNOSIS — Z5112 Encounter for antineoplastic immunotherapy: Secondary | ICD-10-CM | POA: Insufficient documentation

## 2023-06-27 DIAGNOSIS — I10 Essential (primary) hypertension: Secondary | ICD-10-CM | POA: Diagnosis not present

## 2023-06-27 DIAGNOSIS — D696 Thrombocytopenia, unspecified: Secondary | ICD-10-CM | POA: Diagnosis not present

## 2023-06-27 DIAGNOSIS — Z85820 Personal history of malignant melanoma of skin: Secondary | ICD-10-CM | POA: Insufficient documentation

## 2023-06-27 DIAGNOSIS — D693 Immune thrombocytopenic purpura: Secondary | ICD-10-CM | POA: Insufficient documentation

## 2023-06-27 DIAGNOSIS — D72819 Decreased white blood cell count, unspecified: Secondary | ICD-10-CM | POA: Insufficient documentation

## 2023-06-27 LAB — CBC WITH DIFFERENTIAL (CANCER CENTER ONLY)
Abs Immature Granulocytes: 0.05 10*3/uL (ref 0.00–0.07)
Basophils Absolute: 0 10*3/uL (ref 0.0–0.1)
Basophils Relative: 0 %
Eosinophils Absolute: 0.1 10*3/uL (ref 0.0–0.5)
Eosinophils Relative: 1 %
HCT: 39.3 % (ref 36.0–46.0)
Hemoglobin: 13.8 g/dL (ref 12.0–15.0)
Immature Granulocytes: 1 %
Lymphocytes Relative: 27 %
Lymphs Abs: 1.5 10*3/uL (ref 0.7–4.0)
MCH: 29.7 pg (ref 26.0–34.0)
MCHC: 35.1 g/dL (ref 30.0–36.0)
MCV: 84.7 fL (ref 80.0–100.0)
Monocytes Absolute: 0.3 10*3/uL (ref 0.1–1.0)
Monocytes Relative: 6 %
Neutro Abs: 3.7 10*3/uL (ref 1.7–7.7)
Neutrophils Relative %: 65 %
Platelet Count: 109 10*3/uL — ABNORMAL LOW (ref 150–400)
RBC: 4.64 MIL/uL (ref 3.87–5.11)
RDW: 13.5 % (ref 11.5–15.5)
WBC Count: 5.6 10*3/uL (ref 4.0–10.5)
nRBC: 0 % (ref 0.0–0.2)

## 2023-06-27 MED ORDER — PREDNISONE 5 MG PO TABS: 10.0000 mg | ORAL_TABLET | Freq: Every day | ORAL | 0 refills | Status: DC

## 2023-06-27 NOTE — Progress Notes (Signed)
START ON PATHWAY REGIMEN - Immune Thrombocytopenia (ITP)     A cycle is every 7 days:     Rituximab-xxxx   **Always confirm dose/schedule in your pharmacy ordering system**  Patient Characteristics: Relapsed/Refractory, Rapid Response Not Needed, Second Line, Time to Relapse  < 6 Months, Not a Candidate for Further Steroid Therapy Disease Classification: Relapsed / Refractory Line of Therapy: Second Line Time to Relapse: < 6 Months Candidate for Further Steroid Therapy<= No Intent of Therapy: Non-Curative / Palliative Intent, Discussed with Patient

## 2023-06-27 NOTE — Progress Notes (Signed)
[facility] OFFICE PROGRESS NOTE   Diagnosis: ITP  INTERVAL HISTORY:   Ms. Brunelle returns as scheduled.  The prednisone dose was increased last month when the platelet count fell.  She is now maintained on prednisone at a dose of 20 mg daily.  She is eating weight.  She would like to come off of prednisone.  No fever.  No night sweats.  She has sweats during the day.  Objective:  Vital signs in last 24 hours:  Blood pressure (!) 149/75, pulse 69, temperature 98.2 F (36.8 C), temperature source Temporal, resp. rate 18, height 5\' 5"  (1.651 m), weight 196 lb 1.6 oz (89 kg), SpO2 98%.    HEENT: No thrush Lymphatics: No cervical, supraclavicular, axillary, or inguinal nodes Resp: Lungs clear bilaterally Cardio: Regular rate and rhythm GI: No hepatosplenomegaly Vascular: No leg edema  Skin: No petechiae, small ecchymosis at the left leg  Lab Results:  Lab Results  Component Value Date   WBC 5.6 06/27/2023   HGB 13.8 06/27/2023   HCT 39.3 06/27/2023   MCV 84.7 06/27/2023   PLT PENDING 06/27/2023   NEUTROABS 3.7 06/27/2023    CMP  Lab Results  Component Value Date   NA 130 (L) 04/29/2023   K 3.4 (L) 04/29/2023   CL 94 (L) 04/29/2023   CO2 28 04/29/2023   GLUCOSE 114 (H) 04/29/2023   BUN 19 04/29/2023   CREATININE 1.09 (H) 04/29/2023   CALCIUM 9.4 04/29/2023   PROT 6.4 02/13/2023   ALBUMIN 4.3 02/13/2023   AST 23 02/13/2023   ALT 19 02/13/2023   ALKPHOS 66 02/13/2023   BILITOT 0.8 02/13/2023   GFRNONAA 56 (L) 04/29/2023   GFRAA 72 07/28/2020    No results found for: "CEA1", "CEA", "ZOX096", "CA125"  No results found for: "INR", "LABPROT"  Imaging:  No results found.  Medications: I have reviewed the patient's current medications.   Assessment/Plan: Thrombocytopenia-progressive 09/20/2022 Admission 09/20/2022 with severe thrombocytopenia, prednisone 09/20/2022, Solu-Medrol 1 mg/kilogram daily start 09/20/2022 Bone marrow biopsy  09/21/2022-hypercellular marrow for age, increased megakaryocytes, no evidence of a lymphoproliferative disorder Prednisone 80 mg daily 09/22/2022 Prednisone 40 mg daily 09/26/2022 Prednisone 30 mg daily 10/04/2022 Prednisone 20 mg daily 11/01/2022 Prednisone 15 mg daily 11/23/2022 Prednisone 10 mg daily 01/03/2023 Prednisone 5 mg daily 01/26/2023 Prednisone 2.5 mg daily 02/22/2023 Prednisone 5 mg daily 03/14/2023 Prednisone 5 alternating with 2.5 mg daily 04/03/2023 Prednisone 40 mg daily X 3, then 20 mg daily start 05/24/2023 Prednisone decreased to 10 mg daily 06/27/2023 Leukopenia History of melanoma located left leg below the knee status post post excision 1998-completed an immunotherapy trial at [facility] Eosinophilic esophagitis Hypertension Anxiety History of colon polyps Hypercholesterolemia  Elevated IgM level Polyclonal hypergammaglobulinemia      Disposition: Ms. Horigan has ITP.  She has been maintained on prednisone since December 2023.  The number cytopenia response to prednisone, but she has developed recurrent thrombocytopenia when the prednisone has been tapered.  We discussed treatment options including continuing prednisone, rituximab, and thrombopoietin therapy.  She would like to taper the prednisone to off.  I recommend rituximab.  We discussed potential toxicities associated with rituximab including the chance of allergic reaction, rash, atypical infection, pneumonitis, CNS toxicity, and reactivation of hepatitis.  She agrees to proceed.  She received reading materials on rituximab today.  The plan is to begin rituximab therapy next week.  She will be seen for an office visit with week #3 rituximab.  The prednisone will be tapered to  10 mg today.  We will continue the prednisone taper as tolerated.  A treatment plan was entered today.  Thornton Papas, MD  06/27/2023  10:21 AM

## 2023-06-29 NOTE — Progress Notes (Signed)
Pharmacist Chemotherapy Monitoring - Initial Assessment    Anticipated start date: 07/04/23   The following has been reviewed per standard work regarding the patient's treatment regimen: The patient's diagnosis, treatment plan and drug doses, and organ/hematologic function Lab orders and baseline tests specific to treatment regimen  The treatment plan start date, drug sequencing, and pre-medications Prior authorization status  Patient's documented medication list, including drug-drug interaction screen and prescriptions for anti-emetics and supportive care specific to the treatment regimen The drug concentrations, fluid compatibility, administration routes, and timing of the medications to be used The patient's access for treatment and lifetime cumulative dose history, if applicable  The patient's medication allergies and previous infusion related reactions, if applicable   Changes made to treatment plan:  N/A  Follow up needed:  N/A   Daylene Katayama, RPH, 06/29/2023  11:49 AM

## 2023-06-30 ENCOUNTER — Other Ambulatory Visit: Payer: Self-pay | Admitting: Oncology

## 2023-07-02 ENCOUNTER — Other Ambulatory Visit: Payer: Self-pay | Admitting: *Deleted

## 2023-07-02 DIAGNOSIS — D696 Thrombocytopenia, unspecified: Secondary | ICD-10-CM

## 2023-07-03 ENCOUNTER — Encounter: Payer: Self-pay | Admitting: Nurse Practitioner

## 2023-07-04 ENCOUNTER — Inpatient Hospital Stay: Payer: Medicare Other

## 2023-07-04 VITALS — BP 122/73 | HR 68 | Temp 98.2°F | Resp 18 | Ht 65.0 in | Wt 202.0 lb

## 2023-07-04 DIAGNOSIS — Z5112 Encounter for antineoplastic immunotherapy: Secondary | ICD-10-CM | POA: Diagnosis not present

## 2023-07-04 DIAGNOSIS — D696 Thrombocytopenia, unspecified: Secondary | ICD-10-CM

## 2023-07-04 LAB — CBC WITH DIFFERENTIAL (CANCER CENTER ONLY)
Abs Immature Granulocytes: 0.04 10*3/uL (ref 0.00–0.07)
Basophils Absolute: 0 10*3/uL (ref 0.0–0.1)
Basophils Relative: 1 %
Eosinophils Absolute: 0.1 10*3/uL (ref 0.0–0.5)
Eosinophils Relative: 1 %
HCT: 40.2 % (ref 36.0–46.0)
Hemoglobin: 13.9 g/dL (ref 12.0–15.0)
Immature Granulocytes: 1 %
Lymphocytes Relative: 25 %
Lymphs Abs: 1.4 10*3/uL (ref 0.7–4.0)
MCH: 29.5 pg (ref 26.0–34.0)
MCHC: 34.6 g/dL (ref 30.0–36.0)
MCV: 85.4 fL (ref 80.0–100.0)
Monocytes Absolute: 0.4 10*3/uL (ref 0.1–1.0)
Monocytes Relative: 7 %
Neutro Abs: 3.6 10*3/uL (ref 1.7–7.7)
Neutrophils Relative %: 65 %
Platelet Count: 125 10*3/uL — ABNORMAL LOW (ref 150–400)
RBC: 4.71 MIL/uL (ref 3.87–5.11)
RDW: 13.5 % (ref 11.5–15.5)
WBC Count: 5.5 10*3/uL (ref 4.0–10.5)
nRBC: 0 % (ref 0.0–0.2)

## 2023-07-04 LAB — HEPATITIS B SURFACE ANTIGEN: Hepatitis B Surface Ag: NONREACTIVE

## 2023-07-04 LAB — CMP (CANCER CENTER ONLY)
ALT: 14 U/L (ref 0–44)
AST: 14 U/L — ABNORMAL LOW (ref 15–41)
Albumin: 4.3 g/dL (ref 3.5–5.0)
Alkaline Phosphatase: 47 U/L (ref 38–126)
Anion gap: 9 (ref 5–15)
BUN: 15 mg/dL (ref 8–23)
CO2: 26 mmol/L (ref 22–32)
Calcium: 9.4 mg/dL (ref 8.9–10.3)
Chloride: 101 mmol/L (ref 98–111)
Creatinine: 1.12 mg/dL — ABNORMAL HIGH (ref 0.44–1.00)
GFR, Estimated: 54 mL/min — ABNORMAL LOW (ref >60)
Glucose, Bld: 77 mg/dL (ref 70–99)
Potassium: 3.5 mmol/L (ref 3.5–5.1)
Sodium: 136 mmol/L (ref 135–145)
Total Bilirubin: 1 mg/dL (ref 0.3–1.2)
Total Protein: 7 g/dL (ref 6.5–8.1)

## 2023-07-04 LAB — HEPATITIS B CORE ANTIBODY, TOTAL: Hep B Core Total Ab: NONREACTIVE

## 2023-07-04 MED ORDER — SODIUM CHLORIDE 0.9 % IV SOLN
375.0000 mg/m2 | Freq: Once | INTRAVENOUS | Status: AC
  Administered 2023-07-04: 800 mg via INTRAVENOUS
  Filled 2023-07-04: qty 50

## 2023-07-04 MED ORDER — SODIUM CHLORIDE 0.9 % IV SOLN
375.0000 mg/m2 | Freq: Once | INTRAVENOUS | Status: DC
  Filled 2023-07-04: qty 80

## 2023-07-04 MED ORDER — DIPHENHYDRAMINE HCL 25 MG PO CAPS
50.0000 mg | ORAL_CAPSULE | Freq: Once | ORAL | Status: AC
  Administered 2023-07-04: 50 mg via ORAL
  Filled 2023-07-04: qty 2

## 2023-07-04 MED ORDER — ACETAMINOPHEN 325 MG PO TABS
650.0000 mg | ORAL_TABLET | Freq: Once | ORAL | Status: AC
  Administered 2023-07-04: 650 mg via ORAL
  Filled 2023-07-04: qty 2

## 2023-07-04 MED ORDER — SODIUM CHLORIDE 0.9 % IV SOLN: Freq: Once | INTRAVENOUS | Status: AC

## 2023-07-04 NOTE — Patient Instructions (Signed)
[place] [facility]  Discharge Instructions: Thank you for choosing [facility] to provide your oncology and hematology care.   If you have a lab appointment with the Cancer Center, please go directly to the Cancer Center and check in at the registration area.   Wear comfortable clothing and clothing appropriate for easy access to any Portacath or PICC line.   We strive to give you quality time with your provider. You may need to reschedule your appointment if you arrive late (15 or more minutes).  Arriving late affects you and other patients whose appointments are after yours.  Also, if you miss three or more appointments without notifying the office, you may be dismissed from the clinic at the provider's discretion.      For prescription refill requests, have your pharmacy contact our office and allow 72 hours for refills to be completed.    Today you received the following chemotherapy and/or immunotherapy agents: rituximab      To help prevent nausea and vomiting after your treatment, we encourage you to take your nausea medication as directed.  BELOW ARE SYMPTOMS THAT SHOULD BE REPORTED IMMEDIATELY: *FEVER GREATER THAN 100.4 F (38 C) OR HIGHER *CHILLS OR SWEATING *NAUSEA AND VOMITING THAT IS NOT CONTROLLED WITH YOUR NAUSEA MEDICATION *UNUSUAL SHORTNESS OF BREATH *UNUSUAL BRUISING OR BLEEDING *URINARY PROBLEMS (pain or burning when urinating, or frequent urination) *BOWEL PROBLEMS (unusual diarrhea, constipation, pain near the anus) TENDERNESS IN MOUTH AND THROAT WITH OR WITHOUT PRESENCE OF ULCERS (sore throat, sores in mouth, or a toothache) UNUSUAL RASH, SWELLING OR PAIN  UNUSUAL VAGINAL DISCHARGE OR ITCHING   Items with * indicate a potential emergency and should be followed up as soon as possible or go to the Emergency Department if any problems should occur.  Please show the CHEMOTHERAPY ALERT CARD or IMMUNOTHERAPY ALERT CARD at  check-in to the Emergency Department and triage nurse.  Should you have questions after your visit or need to cancel or reschedule your appointment, please contact [place] [facility]  Dept: [phone]  and follow the prompts.  Office hours are 8:00 a.m. to 4:30 p.m. Monday - Friday. Please note that voicemails left after 4:00 p.m. may not be returned until the following business day.  We are closed weekends and major holidays. You have access to a nurse at all times for urgent questions. Please call the main number to the clinic Dept: [phone] and follow the prompts.   For any non-urgent questions, you may also contact your provider using MyChart. We now offer e-Visits for anyone 37 and older to request care online for non-urgent symptoms. For details visit [email or web address].   Also download the MyChart app! Go to the app store, search "MyChart", open the app, select , and log in with your MyChart username and password.  Rituximab Injection What is this medication? RITUXIMAB (ri TUX i mab) treats leukemia and lymphoma. It works by blocking a protein that causes cancer cells to grow and multiply. This helps to slow or stop the spread of cancer cells. It may also be used to treat autoimmune conditions, such as arthritis. It works by slowing down an overactive immune system. It is a monoclonal antibody. This medicine may be used for other purposes; ask your health care provider or pharmacist if you have questions. COMMON BRAND NAME(S): RIABNI, Rituxan, RUXIENCE, truxima What should I tell my care team before I take this medication? They need to know  if you have any of these conditions: Chest pain Heart disease Immune system problems Infection, such as chickenpox, cold sores, hepatitis B, herpes Irregular heartbeat or rhythm Kidney disease Low blood counts, such as low white cells, platelets, red cells Lung disease Recent or upcoming vaccine An  unusual or allergic reaction to rituximab, other medications, foods, dyes, or preservatives Pregnant or trying to get pregnant Breast-feeding How should I use this medication? This medication is injected into a vein. It is given by a care team in a hospital or clinic setting. A special MedGuide will be given to you before each treatment. Be sure to read this information carefully each time. Talk to your care team about the use of this medication in children. While this medication may be prescribed for children as young as 6 months for selected conditions, precautions do apply. Overdosage: If you think you have taken too much of this medicine contact a poison control center or emergency room at once. NOTE: This medicine is only for you. Do not share this medicine with others. What if I miss a dose? Keep appointments for follow-up doses. It is important not to miss your dose. Call your care team if you are unable to keep an appointment. What may interact with this medication? Do not take this medication with any of the following: Live vaccines This medication may also interact with the following: Cisplatin This list may not describe all possible interactions. Give your health care provider a list of all the medicines, herbs, non-prescription drugs, or dietary supplements you use. Also tell them if you smoke, drink alcohol, or use illegal drugs. Some items may interact with your medicine. What should I watch for while using this medication? Your condition will be monitored carefully while you are receiving this medication. You may need blood work while taking this medication. This medication can cause serious infusion reactions. To reduce the risk your care team may give you other medications to take before receiving this one. Be sure to follow the directions from your care team. This medication may increase your risk of getting an infection. Call your care team for advice if you get a fever,  chills, sore throat, or other symptoms of a cold or flu. Do not treat yourself. Try to avoid being around people who are sick. Call your care team if you are around anyone with measles, chickenpox, or if you develop sores or blisters that do not heal properly. Avoid taking medications that contain aspirin, acetaminophen, ibuprofen, naproxen, or ketoprofen unless instructed by your care team. These medications may hide a fever. This medication may cause serious skin reactions. They can happen weeks to months after starting the medication. Contact your care team right away if you notice fevers or flu-like symptoms with a rash. The rash may be red or purple and then turn into blisters or peeling of the skin. You may also notice a red rash with swelling of the face, lips, or lymph nodes in your neck or under your arms. In some patients, this medication may cause a serious brain infection that may cause death. If you have any problems seeing, thinking, speaking, walking, or standing, tell your care team right away. If you cannot reach your care team, urgently seek another source of medical care. Talk to your care team if you may be pregnant. Serious birth defects can occur if you take this medication during pregnancy and for 12 months after the last dose. You will need a negative pregnancy test before  starting this medication. Contraception is recommended while taking this medication and for 12 months after the last dose. Your care team can help you find the option that works for you. Do not breastfeed while taking this medication and for at least 6 months after the last dose. What side effects may I notice from receiving this medication? Side effects that you should report to your care team as soon as possible: Allergic reactions or angioedema--skin rash, itching or hives, swelling of the face, eyes, lips, tongue, arms, or legs, trouble swallowing or breathing Bowel blockage--stomach cramping, unable to have a  bowel movement or pass gas, loss of appetite, vomiting Dizziness, loss of balance or coordination, confusion or trouble speaking Heart attack--pain or tightness in the chest, shoulders, arms, or jaw, nausea, shortness of breath, cold or clammy skin, feeling faint or lightheaded Heart rhythm changes--fast or irregular heartbeat, dizziness, feeling faint or lightheaded, chest pain, trouble breathing Infection--fever, chills, cough, sore throat, wounds that don't heal, pain or trouble when passing urine, general feeling of discomfort or being unwell Infusion reactions--chest pain, shortness of breath or trouble breathing, feeling faint or lightheaded Kidney injury--decrease in the amount of urine, swelling of the ankles, hands, or feet Liver injury--right upper belly pain, loss of appetite, nausea, light-colored stool, dark yellow or brown urine, yellowing skin or eyes, unusual weakness or fatigue Redness, blistering, peeling, or loosening of the skin, including inside the mouth Stomach pain that is severe, does not go away, or gets worse Tumor lysis syndrome (TLS)--nausea, vomiting, diarrhea, decrease in the amount of urine, dark urine, unusual weakness or fatigue, confusion, muscle pain or cramps, fast or irregular heartbeat, joint pain Side effects that usually do not require medical attention (report to your care team if they continue or are bothersome): Headache Joint pain Nausea Runny or stuffy nose Unusual weakness or fatigue This list may not describe all possible side effects. Call your doctor for medical advice about side effects. You may report side effects to FDA at 1-800-FDA-1088. Where should I keep my medication? This medication is given in a hospital or clinic. It will not be stored at home. NOTE: This sheet is a summary. It may not cover all possible information. If you have questions about this medicine, talk to your doctor, pharmacist, or health care provider.  2024  Elsevier/Gold Standard (2022-02-23 00:00:00)

## 2023-07-04 NOTE — Progress Notes (Signed)
Notified patient that Dr. Truett Perna said to change prednisone to 10 mg alternating with 5 mg daily. F/U as scheduledl

## 2023-07-06 ENCOUNTER — Telehealth: Payer: Self-pay

## 2023-07-06 NOTE — Telephone Encounter (Signed)
Called patient for first time chemo follow-up.  Patient stated she has only noticed an increase in fatigue, and had no other complaints or issues.  Reminded patient that this is normal with her treatment and should start to improve with the coming days.  Encouraged patient to contact office for any future questions or concerns.  Patient verbalized understanding. All questions were answered during phone call.

## 2023-07-11 ENCOUNTER — Inpatient Hospital Stay: Payer: Medicare Other

## 2023-07-11 ENCOUNTER — Encounter: Payer: Self-pay | Admitting: *Deleted

## 2023-07-11 ENCOUNTER — Other Ambulatory Visit: Payer: Self-pay | Admitting: *Deleted

## 2023-07-11 VITALS — BP 134/76 | HR 71 | Temp 98.2°F | Resp 17 | Ht 65.0 in | Wt 199.1 lb

## 2023-07-11 DIAGNOSIS — Z5112 Encounter for antineoplastic immunotherapy: Secondary | ICD-10-CM | POA: Diagnosis not present

## 2023-07-11 DIAGNOSIS — D696 Thrombocytopenia, unspecified: Secondary | ICD-10-CM

## 2023-07-11 LAB — CBC WITH DIFFERENTIAL (CANCER CENTER ONLY)
Abs Immature Granulocytes: 0.02 10*3/uL (ref 0.00–0.07)
Basophils Absolute: 0 10*3/uL (ref 0.0–0.1)
Basophils Relative: 1 %
Eosinophils Absolute: 0.1 10*3/uL (ref 0.0–0.5)
Eosinophils Relative: 1 %
HCT: 38.7 % (ref 36.0–46.0)
Hemoglobin: 13.6 g/dL (ref 12.0–15.0)
Immature Granulocytes: 0 %
Lymphocytes Relative: 13 %
Lymphs Abs: 0.7 10*3/uL (ref 0.7–4.0)
MCH: 29.8 pg (ref 26.0–34.0)
MCHC: 35.1 g/dL (ref 30.0–36.0)
MCV: 84.7 fL (ref 80.0–100.0)
Monocytes Absolute: 0.2 10*3/uL (ref 0.1–1.0)
Monocytes Relative: 5 %
Neutro Abs: 4.1 10*3/uL (ref 1.7–7.7)
Neutrophils Relative %: 80 %
Platelet Count: 96 10*3/uL — ABNORMAL LOW (ref 150–400)
RBC: 4.57 MIL/uL (ref 3.87–5.11)
RDW: 13.6 % (ref 11.5–15.5)
WBC Count: 5.1 10*3/uL (ref 4.0–10.5)
nRBC: 0 % (ref 0.0–0.2)

## 2023-07-11 MED ORDER — ACETAMINOPHEN 325 MG PO TABS
650.0000 mg | ORAL_TABLET | Freq: Once | ORAL | Status: AC
  Administered 2023-07-11: 650 mg via ORAL
  Filled 2023-07-11: qty 2

## 2023-07-11 MED ORDER — SODIUM CHLORIDE 0.9 % IV SOLN: Freq: Once | INTRAVENOUS | Status: AC

## 2023-07-11 MED ORDER — DIPHENHYDRAMINE HCL 25 MG PO CAPS
50.0000 mg | ORAL_CAPSULE | Freq: Once | ORAL | Status: AC
  Administered 2023-07-11: 50 mg via ORAL
  Filled 2023-07-11: qty 2

## 2023-07-11 MED ORDER — SODIUM CHLORIDE 0.9 % IV SOLN
375.0000 mg/m2 | Freq: Once | INTRAVENOUS | Status: AC
  Administered 2023-07-11: 800 mg via INTRAVENOUS
  Filled 2023-07-11: qty 50

## 2023-07-11 NOTE — Progress Notes (Signed)
Platelet count today 96,000. Per Dr. Truett Perna stay on prednisone 10 mg alternate with 5 mg daily.

## 2023-07-14 ENCOUNTER — Other Ambulatory Visit: Payer: Self-pay | Admitting: Oncology

## 2023-07-18 ENCOUNTER — Inpatient Hospital Stay: Payer: Medicare Other | Attending: Nurse Practitioner

## 2023-07-18 ENCOUNTER — Encounter: Payer: Self-pay | Admitting: *Deleted

## 2023-07-18 ENCOUNTER — Inpatient Hospital Stay: Payer: Medicare Other

## 2023-07-18 ENCOUNTER — Inpatient Hospital Stay (HOSPITAL_BASED_OUTPATIENT_CLINIC_OR_DEPARTMENT_OTHER): Payer: Medicare Other | Admitting: Oncology

## 2023-07-18 VITALS — BP 132/76 | HR 59 | Temp 98.1°F | Resp 18

## 2023-07-18 DIAGNOSIS — D696 Thrombocytopenia, unspecified: Secondary | ICD-10-CM

## 2023-07-18 DIAGNOSIS — D693 Immune thrombocytopenic purpura: Secondary | ICD-10-CM | POA: Insufficient documentation

## 2023-07-18 DIAGNOSIS — Z5112 Encounter for antineoplastic immunotherapy: Secondary | ICD-10-CM | POA: Insufficient documentation

## 2023-07-18 LAB — CBC WITH DIFFERENTIAL (CANCER CENTER ONLY)
Abs Immature Granulocytes: 0.01 10*3/uL (ref 0.00–0.07)
Basophils Absolute: 0 10*3/uL (ref 0.0–0.1)
Basophils Relative: 1 %
Eosinophils Absolute: 0.1 10*3/uL (ref 0.0–0.5)
Eosinophils Relative: 2 %
HCT: 40.7 % (ref 36.0–46.0)
Hemoglobin: 14.3 g/dL (ref 12.0–15.0)
Immature Granulocytes: 0 %
Lymphocytes Relative: 22 %
Lymphs Abs: 1 10*3/uL (ref 0.7–4.0)
MCH: 29.4 pg (ref 26.0–34.0)
MCHC: 35.1 g/dL (ref 30.0–36.0)
MCV: 83.7 fL (ref 80.0–100.0)
Monocytes Absolute: 0.3 10*3/uL (ref 0.1–1.0)
Monocytes Relative: 7 %
Neutro Abs: 2.9 10*3/uL (ref 1.7–7.7)
Neutrophils Relative %: 68 %
Platelet Count: 84 10*3/uL — ABNORMAL LOW (ref 150–400)
RBC: 4.86 MIL/uL (ref 3.87–5.11)
RDW: 13.6 % (ref 11.5–15.5)
WBC Count: 4.3 10*3/uL (ref 4.0–10.5)
nRBC: 0 % (ref 0.0–0.2)

## 2023-07-18 MED ORDER — SODIUM CHLORIDE 0.9 % IV SOLN: Freq: Once | INTRAVENOUS | Status: AC

## 2023-07-18 MED ORDER — ACETAMINOPHEN 325 MG PO TABS
650.0000 mg | ORAL_TABLET | Freq: Once | ORAL | Status: AC
  Administered 2023-07-18: 650 mg via ORAL
  Filled 2023-07-18: qty 2

## 2023-07-18 MED ORDER — SODIUM CHLORIDE 0.9 % IV SOLN
375.0000 mg/m2 | Freq: Once | INTRAVENOUS | Status: DC
  Filled 2023-07-18: qty 80

## 2023-07-18 MED ORDER — DIPHENHYDRAMINE HCL 25 MG PO CAPS
50.0000 mg | ORAL_CAPSULE | Freq: Once | ORAL | Status: AC
  Administered 2023-07-18: 50 mg via ORAL
  Filled 2023-07-18: qty 2

## 2023-07-18 MED ORDER — SODIUM CHLORIDE 0.9 % IV SOLN
375.0000 mg/m2 | Freq: Once | INTRAVENOUS | Status: AC
  Administered 2023-07-18: 800 mg via INTRAVENOUS
  Filled 2023-07-18: qty 50

## 2023-07-18 NOTE — Progress Notes (Signed)
  [facility] OFFICE PROGRESS NOTE   Diagnosis: ITP  INTERVAL HISTORY:   Carol Thomas has completed 2 weeks of rituximab.  She reports tolerating the rituximab well.  No symptom of allergic reaction.  No bleeding.  She continues prednisone alternating 10 mg and 5 mg daily.  Objective:  Vital signs in last 24 hours:  Blood pressure 139/85, pulse 90, temperature 98.2 F (36.8 C), temperature source Oral, resp. rate 18, weight 195 lb 1.6 oz (88.5 kg), SpO2 100%.    HEENT: Mild white coat over the tongue, no thrush Resp: Lungs clear bilaterally Cardio: Regular rate and rhythm GI: No hepatosplenomegaly Vascular: No leg edema   Lab Results:  Lab Results  Component Value Date   WBC 4.3 07/18/2023   HGB 14.3 07/18/2023   HCT 40.7 07/18/2023   MCV 83.7 07/18/2023   PLT 84 (L) 07/18/2023   NEUTROABS 2.9 07/18/2023    CMP  Lab Results  Component Value Date   NA 136 07/04/2023   K 3.5 07/04/2023   CL 101 07/04/2023   CO2 26 07/04/2023   GLUCOSE 77 07/04/2023   BUN 15 07/04/2023   CREATININE 1.12 (H) 07/04/2023   CALCIUM 9.4 07/04/2023   PROT 7.0 07/04/2023   ALBUMIN 4.3 07/04/2023   AST 14 (L) 07/04/2023   ALT 14 07/04/2023   ALKPHOS 47 07/04/2023   BILITOT 1.0 07/04/2023   GFRNONAA 54 (L) 07/04/2023   GFRAA 72 07/28/2020     Medications: I have reviewed the patient's current medications.   Assessment/Plan: Thrombocytopenia-progressive 09/20/2022 Admission 09/20/2022 with severe thrombocytopenia, prednisone 09/20/2022, Solu-Medrol 1 mg/kilogram daily start 09/20/2022 Bone marrow biopsy 09/21/2022-hypercellular marrow for age, increased megakaryocytes, no evidence of a lymphoproliferative disorder Prednisone 80 mg daily 09/22/2022 Prednisone 40 mg daily 09/26/2022 Prednisone 30 mg daily 10/04/2022 Prednisone 20 mg daily 11/01/2022 Prednisone 15 mg daily 11/23/2022 Prednisone 10 mg daily 01/03/2023 Prednisone 5 mg daily 01/26/2023 Prednisone 2.5 mg daily  02/22/2023 Prednisone 5 mg daily 03/14/2023 Prednisone 5 alternating with 2.5 mg daily 04/03/2023 Prednisone 40 mg daily X 3, then 20 mg daily start 05/24/2023 Prednisone decreased to 10 mg daily 06/27/2023 Prednisone decreased to 10 mg alternating with 5 mg daily 07/04/2023 Cycle 1 rituximab 07/04/2023 Cycle 2 rituximab 07/11/2023 Cycle 3 rituximab 07/18/2023 Leukopenia History of melanoma located left leg below the knee status post post excision 1998-completed an immunotherapy trial at [facility] Eosinophilic esophagitis Hypertension Anxiety History of colon polyps Hypercholesterolemia  Elevated IgM level Polyclonal hypergammaglobulinemia    Disposition: Carol Thomas has completed 2 weeks of rituximab.  She has tolerated the treatment well.  She will complete week 3 today.  The platelet count is mildly decreased.  She will continue prednisone at the current dose.  Carol Thomas will return for an office visit and rituximab in 1 week.  Thornton Papas, MD  07/18/2023  11:03 AM

## 2023-07-18 NOTE — Progress Notes (Signed)
Patient seen by Dr. Thornton Papas today  Vitals are within treatment parameters:Yes   Labs are within treatment parameters: No-platelet count 84,000--proceed w/treatment  Treatment plan has been signed: Yes   Per physician team, Patient is ready for treatment and there are NO modifications to the treatment plan.

## 2023-07-18 NOTE — Patient Instructions (Signed)
[place] [facility]  Discharge Instructions: Thank you for choosing [facility] to provide your oncology and hematology care.   If you have a lab appointment with the Cancer Center, please go directly to the Cancer Center and check in at the registration area.   Wear comfortable clothing and clothing appropriate for easy access to any Portacath or PICC line.   We strive to give you quality time with your provider. You may need to reschedule your appointment if you arrive late (15 or more minutes).  Arriving late affects you and other patients whose appointments are after yours.  Also, if you miss three or more appointments without notifying the office, you may be dismissed from the clinic at the provider's discretion.      For prescription refill requests, have your pharmacy contact our office and allow 72 hours for refills to be completed.    Today you received the following chemotherapy and/or immunotherapy agents: rituximab      To help prevent nausea and vomiting after your treatment, we encourage you to take your nausea medication as directed.  BELOW ARE SYMPTOMS THAT SHOULD BE REPORTED IMMEDIATELY: *FEVER GREATER THAN 100.4 F (38 C) OR HIGHER *CHILLS OR SWEATING *NAUSEA AND VOMITING THAT IS NOT CONTROLLED WITH YOUR NAUSEA MEDICATION *UNUSUAL SHORTNESS OF BREATH *UNUSUAL BRUISING OR BLEEDING *URINARY PROBLEMS (pain or burning when urinating, or frequent urination) *BOWEL PROBLEMS (unusual diarrhea, constipation, pain near the anus) TENDERNESS IN MOUTH AND THROAT WITH OR WITHOUT PRESENCE OF ULCERS (sore throat, sores in mouth, or a toothache) UNUSUAL RASH, SWELLING OR PAIN  UNUSUAL VAGINAL DISCHARGE OR ITCHING   Items with * indicate a potential emergency and should be followed up as soon as possible or go to the Emergency Department if any problems should occur.  Please show the CHEMOTHERAPY ALERT CARD or IMMUNOTHERAPY ALERT CARD at  check-in to the Emergency Department and triage nurse.  Should you have questions after your visit or need to cancel or reschedule your appointment, please contact [place] [facility]  Dept: [phone]  and follow the prompts.  Office hours are 8:00 a.m. to 4:30 p.m. Monday - Friday. Please note that voicemails left after 4:00 p.m. may not be returned until the following business day.  We are closed weekends and major holidays. You have access to a nurse at all times for urgent questions. Please call the main number to the clinic Dept: [phone] and follow the prompts.   For any non-urgent questions, you may also contact your provider using MyChart. We now offer e-Visits for anyone 37 and older to request care online for non-urgent symptoms. For details visit [email or web address].   Also download the MyChart app! Go to the app store, search "MyChart", open the app, select , and log in with your MyChart username and password.  Rituximab Injection What is this medication? RITUXIMAB (ri TUX i mab) treats leukemia and lymphoma. It works by blocking a protein that causes cancer cells to grow and multiply. This helps to slow or stop the spread of cancer cells. It may also be used to treat autoimmune conditions, such as arthritis. It works by slowing down an overactive immune system. It is a monoclonal antibody. This medicine may be used for other purposes; ask your health care provider or pharmacist if you have questions. COMMON BRAND NAME(S): RIABNI, Rituxan, RUXIENCE, truxima What should I tell my care team before I take this medication? They need to know  if you have any of these conditions: Chest pain Heart disease Immune system problems Infection, such as chickenpox, cold sores, hepatitis B, herpes Irregular heartbeat or rhythm Kidney disease Low blood counts, such as low white cells, platelets, red cells Lung disease Recent or upcoming vaccine An  unusual or allergic reaction to rituximab, other medications, foods, dyes, or preservatives Pregnant or trying to get pregnant Breast-feeding How should I use this medication? This medication is injected into a vein. It is given by a care team in a hospital or clinic setting. A special MedGuide will be given to you before each treatment. Be sure to read this information carefully each time. Talk to your care team about the use of this medication in children. While this medication may be prescribed for children as young as 6 months for selected conditions, precautions do apply. Overdosage: If you think you have taken too much of this medicine contact a poison control center or emergency room at once. NOTE: This medicine is only for you. Do not share this medicine with others. What if I miss a dose? Keep appointments for follow-up doses. It is important not to miss your dose. Call your care team if you are unable to keep an appointment. What may interact with this medication? Do not take this medication with any of the following: Live vaccines This medication may also interact with the following: Cisplatin This list may not describe all possible interactions. Give your health care provider a list of all the medicines, herbs, non-prescription drugs, or dietary supplements you use. Also tell them if you smoke, drink alcohol, or use illegal drugs. Some items may interact with your medicine. What should I watch for while using this medication? Your condition will be monitored carefully while you are receiving this medication. You may need blood work while taking this medication. This medication can cause serious infusion reactions. To reduce the risk your care team may give you other medications to take before receiving this one. Be sure to follow the directions from your care team. This medication may increase your risk of getting an infection. Call your care team for advice if you get a fever,  chills, sore throat, or other symptoms of a cold or flu. Do not treat yourself. Try to avoid being around people who are sick. Call your care team if you are around anyone with measles, chickenpox, or if you develop sores or blisters that do not heal properly. Avoid taking medications that contain aspirin, acetaminophen, ibuprofen, naproxen, or ketoprofen unless instructed by your care team. These medications may hide a fever. This medication may cause serious skin reactions. They can happen weeks to months after starting the medication. Contact your care team right away if you notice fevers or flu-like symptoms with a rash. The rash may be red or purple and then turn into blisters or peeling of the skin. You may also notice a red rash with swelling of the face, lips, or lymph nodes in your neck or under your arms. In some patients, this medication may cause a serious brain infection that may cause death. If you have any problems seeing, thinking, speaking, walking, or standing, tell your care team right away. If you cannot reach your care team, urgently seek another source of medical care. Talk to your care team if you may be pregnant. Serious birth defects can occur if you take this medication during pregnancy and for 12 months after the last dose. You will need a negative pregnancy test before  starting this medication. Contraception is recommended while taking this medication and for 12 months after the last dose. Your care team can help you find the option that works for you. Do not breastfeed while taking this medication and for at least 6 months after the last dose. What side effects may I notice from receiving this medication? Side effects that you should report to your care team as soon as possible: Allergic reactions or angioedema--skin rash, itching or hives, swelling of the face, eyes, lips, tongue, arms, or legs, trouble swallowing or breathing Bowel blockage--stomach cramping, unable to have a  bowel movement or pass gas, loss of appetite, vomiting Dizziness, loss of balance or coordination, confusion or trouble speaking Heart attack--pain or tightness in the chest, shoulders, arms, or jaw, nausea, shortness of breath, cold or clammy skin, feeling faint or lightheaded Heart rhythm changes--fast or irregular heartbeat, dizziness, feeling faint or lightheaded, chest pain, trouble breathing Infection--fever, chills, cough, sore throat, wounds that don't heal, pain or trouble when passing urine, general feeling of discomfort or being unwell Infusion reactions--chest pain, shortness of breath or trouble breathing, feeling faint or lightheaded Kidney injury--decrease in the amount of urine, swelling of the ankles, hands, or feet Liver injury--right upper belly pain, loss of appetite, nausea, light-colored stool, dark yellow or brown urine, yellowing skin or eyes, unusual weakness or fatigue Redness, blistering, peeling, or loosening of the skin, including inside the mouth Stomach pain that is severe, does not go away, or gets worse Tumor lysis syndrome (TLS)--nausea, vomiting, diarrhea, decrease in the amount of urine, dark urine, unusual weakness or fatigue, confusion, muscle pain or cramps, fast or irregular heartbeat, joint pain Side effects that usually do not require medical attention (report to your care team if they continue or are bothersome): Headache Joint pain Nausea Runny or stuffy nose Unusual weakness or fatigue This list may not describe all possible side effects. Call your doctor for medical advice about side effects. You may report side effects to FDA at 1-800-FDA-1088. Where should I keep my medication? This medication is given in a hospital or clinic. It will not be stored at home. NOTE: This sheet is a summary. It may not cover all possible information. If you have questions about this medicine, talk to your doctor, pharmacist, or health care provider.  2024  Elsevier/Gold Standard (2022-02-23 00:00:00)

## 2023-07-21 ENCOUNTER — Other Ambulatory Visit: Payer: Self-pay | Admitting: Family Medicine

## 2023-07-21 DIAGNOSIS — Z9189 Other specified personal risk factors, not elsewhere classified: Secondary | ICD-10-CM

## 2023-07-21 DIAGNOSIS — E7800 Pure hypercholesterolemia, unspecified: Secondary | ICD-10-CM

## 2023-07-21 DIAGNOSIS — I700 Atherosclerosis of aorta: Secondary | ICD-10-CM

## 2023-07-21 DIAGNOSIS — M85852 Other specified disorders of bone density and structure, left thigh: Secondary | ICD-10-CM

## 2023-07-22 ENCOUNTER — Other Ambulatory Visit: Payer: Self-pay | Admitting: Oncology

## 2023-07-25 ENCOUNTER — Inpatient Hospital Stay: Payer: Medicare Other

## 2023-07-25 ENCOUNTER — Other Ambulatory Visit: Payer: Self-pay | Admitting: Family Medicine

## 2023-07-25 ENCOUNTER — Inpatient Hospital Stay (HOSPITAL_BASED_OUTPATIENT_CLINIC_OR_DEPARTMENT_OTHER): Payer: Medicare Other | Admitting: Nurse Practitioner

## 2023-07-25 ENCOUNTER — Encounter: Payer: Self-pay | Admitting: Nurse Practitioner

## 2023-07-25 VITALS — BP 137/84 | HR 85 | Temp 97.9°F | Resp 18 | Ht 65.0 in | Wt 200.0 lb

## 2023-07-25 VITALS — BP 112/64 | HR 75 | Temp 98.2°F | Resp 18

## 2023-07-25 DIAGNOSIS — D696 Thrombocytopenia, unspecified: Secondary | ICD-10-CM

## 2023-07-25 DIAGNOSIS — Z5112 Encounter for antineoplastic immunotherapy: Secondary | ICD-10-CM | POA: Diagnosis not present

## 2023-07-25 DIAGNOSIS — F411 Generalized anxiety disorder: Secondary | ICD-10-CM

## 2023-07-25 DIAGNOSIS — I10 Essential (primary) hypertension: Secondary | ICD-10-CM

## 2023-07-25 LAB — CBC WITH DIFFERENTIAL (CANCER CENTER ONLY)
Abs Immature Granulocytes: 0.03 10*3/uL (ref 0.00–0.07)
Basophils Absolute: 0.1 10*3/uL (ref 0.0–0.1)
Basophils Relative: 1 %
Eosinophils Absolute: 0.1 10*3/uL (ref 0.0–0.5)
Eosinophils Relative: 2 %
HCT: 40 % (ref 36.0–46.0)
Hemoglobin: 14 g/dL (ref 12.0–15.0)
Immature Granulocytes: 1 %
Lymphocytes Relative: 20 %
Lymphs Abs: 0.8 10*3/uL (ref 0.7–4.0)
MCH: 29.8 pg (ref 26.0–34.0)
MCHC: 35 g/dL (ref 30.0–36.0)
MCV: 85.1 fL (ref 80.0–100.0)
Monocytes Absolute: 0.4 10*3/uL (ref 0.1–1.0)
Monocytes Relative: 9 %
Neutro Abs: 2.8 10*3/uL (ref 1.7–7.7)
Neutrophils Relative %: 67 %
Platelet Count: 130 10*3/uL — ABNORMAL LOW (ref 150–400)
RBC: 4.7 MIL/uL (ref 3.87–5.11)
RDW: 13.6 % (ref 11.5–15.5)
WBC Count: 4.1 10*3/uL (ref 4.0–10.5)
nRBC: 0 % (ref 0.0–0.2)

## 2023-07-25 MED ORDER — DIPHENHYDRAMINE HCL 25 MG PO CAPS
50.0000 mg | ORAL_CAPSULE | Freq: Once | ORAL | Status: AC
  Administered 2023-07-25: 50 mg via ORAL
  Filled 2023-07-25: qty 2

## 2023-07-25 MED ORDER — SODIUM CHLORIDE 0.9 % IV SOLN
375.0000 mg/m2 | Freq: Once | INTRAVENOUS | Status: AC
  Administered 2023-07-25: 800 mg via INTRAVENOUS
  Filled 2023-07-25: qty 50

## 2023-07-25 MED ORDER — SODIUM CHLORIDE 0.9 % IV SOLN: Freq: Once | INTRAVENOUS | Status: AC

## 2023-07-25 MED ORDER — ACETAMINOPHEN 325 MG PO TABS
650.0000 mg | ORAL_TABLET | Freq: Once | ORAL | Status: AC
  Administered 2023-07-25: 650 mg via ORAL
  Filled 2023-07-25: qty 2

## 2023-07-25 NOTE — Progress Notes (Signed)
Patient seen by Lonna Cobb NP today  Vitals are within treatment parameters:Yes   Labs are within treatment parameters: Yes   Treatment plan has been signed: Yes   Per physician team, Patient is ready for treatment and there are NO modifications to the treatment plan.

## 2023-07-25 NOTE — Patient Instructions (Signed)
[place] [facility]  Discharge Instructions: Thank you for choosing [facility] to provide your oncology and hematology care.   If you have a lab appointment with the Cancer Center, please go directly to the Cancer Center and check in at the registration area.   Wear comfortable clothing and clothing appropriate for easy access to any Portacath or PICC line.   We strive to give you quality time with your provider. You may need to reschedule your appointment if you arrive late (15 or more minutes).  Arriving late affects you and other patients whose appointments are after yours.  Also, if you miss three or more appointments without notifying the office, you may be dismissed from the clinic at the provider's discretion.      For prescription refill requests, have your pharmacy contact our office and allow 72 hours for refills to be completed.    Today you received the following chemotherapy and/or immunotherapy agents Rituxan      To help prevent nausea and vomiting after your treatment, we encourage you to take your nausea medication as directed.  BELOW ARE SYMPTOMS THAT SHOULD BE REPORTED IMMEDIATELY: *FEVER GREATER THAN 100.4 F (38 C) OR HIGHER *CHILLS OR SWEATING *NAUSEA AND VOMITING THAT IS NOT CONTROLLED WITH YOUR NAUSEA MEDICATION *UNUSUAL SHORTNESS OF BREATH *UNUSUAL BRUISING OR BLEEDING *URINARY PROBLEMS (pain or burning when urinating, or frequent urination) *BOWEL PROBLEMS (unusual diarrhea, constipation, pain near the anus) TENDERNESS IN MOUTH AND THROAT WITH OR WITHOUT PRESENCE OF ULCERS (sore throat, sores in mouth, or a toothache) UNUSUAL RASH, SWELLING OR PAIN  UNUSUAL VAGINAL DISCHARGE OR ITCHING   Items with * indicate a potential emergency and should be followed up as soon as possible or go to the Emergency Department if any problems should occur.  Please show the CHEMOTHERAPY ALERT CARD or IMMUNOTHERAPY ALERT CARD at check-in  to the Emergency Department and triage nurse.  Should you have questions after your visit or need to cancel or reschedule your appointment, please contact [place] [facility]  Dept: [phone]  and follow the prompts.  Office hours are 8:00 a.m. to 4:30 p.m. Monday - Friday. Please note that voicemails left after 4:00 p.m. may not be returned until the following business day.  We are closed weekends and major holidays. You have access to a nurse at all times for urgent questions. Please call the main number to the clinic Dept: [phone] and follow the prompts.   For any non-urgent questions, you may also contact your provider using MyChart. We now offer e-Visits for anyone 92 and older to request care online for non-urgent symptoms. For details visit [email or web address].   Also download the MyChart app! Go to the app store, search "MyChart", open the app, select [place], and log in with your MyChart username and password.

## 2023-07-25 NOTE — Progress Notes (Signed)
  Carol Thomas   Diagnosis: ITP  INTERVAL HISTORY:   [name] returns as scheduled.  She completed cycle 3 weekly rituximab 07/18/2023.  She continues prednisone 10 mg alternating with 5 mg.  She feels cold during the Rituxan infusions.  No shaking chills.  No rash.  She denies bleeding.  She notes a decrease in easy bruising.  She has intermittent sweats.  No fever.  Objective:  Vital signs in last 24 hours:  Blood pressure 137/84, pulse 85, temperature 97.9 F (36.6 C), temperature source Temporal, resp. rate 18, height 5\' 5"  (1.651 m), weight 200 lb (90.7 kg), SpO2 100%.    HEENT: No blood in the mouth. Resp: Lungs clear bilaterally. Cardio: Regular rate and rhythm. GI: No hepatosplenomegaly. Vascular: No leg edema. Skin: Resolving ecchymoses on both arms.   Lab Results:  Lab Results  Component Value Date   WBC 4.3 07/18/2023   HGB 14.3 07/18/2023   HCT 40.7 07/18/2023   MCV 83.7 07/18/2023   PLT 84 (L) 07/18/2023   NEUTROABS 2.9 07/18/2023    Imaging:  No results found.  Medications: I have reviewed the patient's current medications.  Assessment/Plan: Thrombocytopenia-progressive 09/20/2022 Admission 09/20/2022 with severe thrombocytopenia, prednisone 09/20/2022, Solu-Medrol 1 mg/kilogram daily start 09/20/2022 Bone marrow biopsy 09/21/2022-hypercellular marrow for age, increased megakaryocytes, no evidence of a lymphoproliferative disorder Prednisone 80 mg daily 09/22/2022 Prednisone 40 mg daily 09/26/2022 Prednisone 30 mg daily 10/04/2022 Prednisone 20 mg daily 11/01/2022 Prednisone 15 mg daily 11/23/2022 Prednisone 10 mg daily 01/03/2023 Prednisone 5 mg daily 01/26/2023 Prednisone 2.5 mg daily 02/22/2023 Prednisone 5 mg daily 03/14/2023 Prednisone 5 alternating with 2.5 mg daily 04/03/2023 Prednisone 40 mg daily X 3, then 20 mg daily start 05/24/2023 Prednisone decreased to 10 mg daily 06/27/2023 Prednisone decreased to 10 mg  alternating with 5 mg daily 07/04/2023 Cycle 1 rituximab 07/04/2023 Cycle 2 rituximab 07/11/2023 Cycle 3 rituximab 07/18/2023 Prednisone decreased to 5 mg daily 07/25/2023 Cycle 4 rituximab 07/25/2023 Leukopenia History of melanoma located left leg below the knee status post post excision 1998-completed an immunotherapy trial at [facility] Eosinophilic esophagitis Hypertension Anxiety History of colon polyps Hypercholesterolemia  Elevated IgM level Polyclonal hypergammaglobulinemia  Disposition: [name] appears stable.  She has completed 3 weekly rituximab infusions.  She is tolerating well.  Plan to proceed with the fourth and final infusion today.  We reviewed the CBC from today.  The platelet count is higher, 130,000.  She will taper prednisone to 5 mg daily and return for a repeat CBC in about 10 days.  She will return for lab and follow-up in 3 weeks.  She will contact the office in the interim with spontaneous bruising/bleeding.    Lonna Cobb ANP/GNP-BC   07/25/2023  10:51 AM

## 2023-08-03 ENCOUNTER — Inpatient Hospital Stay: Payer: Medicare Other

## 2023-08-03 DIAGNOSIS — D696 Thrombocytopenia, unspecified: Secondary | ICD-10-CM

## 2023-08-03 DIAGNOSIS — Z5112 Encounter for antineoplastic immunotherapy: Secondary | ICD-10-CM | POA: Diagnosis not present

## 2023-08-03 LAB — CBC WITH DIFFERENTIAL (CANCER CENTER ONLY)
Abs Immature Granulocytes: 0.01 10*3/uL (ref 0.00–0.07)
Basophils Absolute: 0 10*3/uL (ref 0.0–0.1)
Basophils Relative: 1 %
Eosinophils Absolute: 0.1 10*3/uL (ref 0.0–0.5)
Eosinophils Relative: 2 %
HCT: 37.8 % (ref 36.0–46.0)
Hemoglobin: 13 g/dL (ref 12.0–15.0)
Immature Granulocytes: 0 %
Lymphocytes Relative: 27 %
Lymphs Abs: 1 10*3/uL (ref 0.7–4.0)
MCH: 29.3 pg (ref 26.0–34.0)
MCHC: 34.4 g/dL (ref 30.0–36.0)
MCV: 85.3 fL (ref 80.0–100.0)
Monocytes Absolute: 0.3 10*3/uL (ref 0.1–1.0)
Monocytes Relative: 9 %
Neutro Abs: 2.2 10*3/uL (ref 1.7–7.7)
Neutrophils Relative %: 61 %
Platelet Count: 119 10*3/uL — ABNORMAL LOW (ref 150–400)
RBC: 4.43 MIL/uL (ref 3.87–5.11)
RDW: 14 % (ref 11.5–15.5)
WBC Count: 3.6 10*3/uL — ABNORMAL LOW (ref 4.0–10.5)
nRBC: 0 % (ref 0.0–0.2)

## 2023-08-06 ENCOUNTER — Telehealth: Payer: Self-pay

## 2023-08-06 NOTE — Telephone Encounter (Signed)
Patient gave verbal understanding and had no further questions or concerns  

## 2023-08-06 NOTE — Telephone Encounter (Signed)
-----   Message from Lonna Cobb sent at 08/03/2023  4:49 PM EDT ----- Please call her--continue current dose of prednisone, follow up as scheduled.

## 2023-08-14 ENCOUNTER — Other Ambulatory Visit: Payer: Self-pay | Admitting: Family Medicine

## 2023-08-14 DIAGNOSIS — I10 Essential (primary) hypertension: Secondary | ICD-10-CM

## 2023-08-15 ENCOUNTER — Inpatient Hospital Stay (HOSPITAL_BASED_OUTPATIENT_CLINIC_OR_DEPARTMENT_OTHER): Payer: Medicare Other | Admitting: Oncology

## 2023-08-15 ENCOUNTER — Inpatient Hospital Stay: Payer: Medicare Other

## 2023-08-15 VITALS — BP 134/79 | HR 92 | Temp 98.1°F | Resp 20 | Ht 65.0 in | Wt 200.6 lb

## 2023-08-15 DIAGNOSIS — D696 Thrombocytopenia, unspecified: Secondary | ICD-10-CM

## 2023-08-15 DIAGNOSIS — Z5112 Encounter for antineoplastic immunotherapy: Secondary | ICD-10-CM | POA: Diagnosis not present

## 2023-08-15 LAB — CBC WITH DIFFERENTIAL (CANCER CENTER ONLY)
Abs Immature Granulocytes: 0.02 10*3/uL (ref 0.00–0.07)
Basophils Absolute: 0 10*3/uL (ref 0.0–0.1)
Basophils Relative: 1 %
Eosinophils Absolute: 0.1 10*3/uL (ref 0.0–0.5)
Eosinophils Relative: 2 %
HCT: 39.2 % (ref 36.0–46.0)
Hemoglobin: 13.7 g/dL (ref 12.0–15.0)
Immature Granulocytes: 0 %
Lymphocytes Relative: 14 %
Lymphs Abs: 0.7 10*3/uL (ref 0.7–4.0)
MCH: 29.5 pg (ref 26.0–34.0)
MCHC: 34.9 g/dL (ref 30.0–36.0)
MCV: 84.5 fL (ref 80.0–100.0)
Monocytes Absolute: 0.4 10*3/uL (ref 0.1–1.0)
Monocytes Relative: 8 %
Neutro Abs: 3.7 10*3/uL (ref 1.7–7.7)
Neutrophils Relative %: 75 %
Platelet Count: 124 10*3/uL — ABNORMAL LOW (ref 150–400)
RBC: 4.64 MIL/uL (ref 3.87–5.11)
RDW: 13.5 % (ref 11.5–15.5)
WBC Count: 5 10*3/uL (ref 4.0–10.5)
nRBC: 0 % (ref 0.0–0.2)

## 2023-08-15 NOTE — Progress Notes (Signed)
  [facility] OFFICE PROGRESS NOTE   Diagnosis: ITP  INTERVAL HISTORY:   Carol Thomas returns as scheduled.  She completed week #4 rituximab on 07/25/2023.  She tolerated the rituximab well.  No sign of an allergic reaction.  No bleeding.  She continues prednisone at a dose of 5 mg daily  Objective:  Vital signs in last 24 hours:  Blood pressure 134/79, pulse 92, temperature 98.1 F (36.7 C), temperature source Temporal, resp. rate 20, height 5\' 5"  (1.651 m), weight 200 lb 9.6 oz (91 kg), SpO2 100%.    HEENT: No thrush or bleeding Resp: Lungs clear bilaterally Cardio: Regular rate and rhythm GI: No hepatosplenomegaly Vascular: No leg edema  Skin: Resolving ecchymoses at the left forearm  Lab Results:  Lab Results  Component Value Date   WBC 5.0 08/15/2023   HGB 13.7 08/15/2023   HCT 39.2 08/15/2023   MCV 84.5 08/15/2023   PLT 124 (L) 08/15/2023   NEUTROABS 3.7 08/15/2023    CMP  Lab Results  Component Value Date   NA 136 07/04/2023   K 3.5 07/04/2023   CL 101 07/04/2023   CO2 26 07/04/2023   GLUCOSE 77 07/04/2023   BUN 15 07/04/2023   CREATININE 1.12 (H) 07/04/2023   CALCIUM 9.4 07/04/2023   PROT 7.0 07/04/2023   ALBUMIN 4.3 07/04/2023   AST 14 (L) 07/04/2023   ALT 14 07/04/2023   ALKPHOS 47 07/04/2023   BILITOT 1.0 07/04/2023   GFRNONAA 54 (L) 07/04/2023   GFRAA 72 07/28/2020      Medications: I have reviewed the patient's current medications.   Assessment/Plan:  Thrombocytopenia-progressive 09/20/2022 Admission 09/20/2022 with severe thrombocytopenia, prednisone 09/20/2022, Solu-Medrol 1 mg/kilogram daily start 09/20/2022 Bone marrow biopsy 09/21/2022-hypercellular marrow for age, increased megakaryocytes, no evidence of a lymphoproliferative disorder Prednisone 80 mg daily 09/22/2022 Prednisone 40 mg daily 09/26/2022 Prednisone 30 mg daily 10/04/2022 Prednisone 20 mg daily 11/01/2022 Prednisone 15 mg daily 11/23/2022 Prednisone 10 mg  daily 01/03/2023 Prednisone 5 mg daily 01/26/2023 Prednisone 2.5 mg daily 02/22/2023 Prednisone 5 mg daily 03/14/2023 Prednisone 5 alternating with 2.5 mg daily 04/03/2023 Prednisone 40 mg daily X 3, then 20 mg daily start 05/24/2023 Prednisone decreased to 10 mg daily 06/27/2023 Prednisone decreased to 10 mg alternating with 5 mg daily 07/04/2023 Cycle 1 rituximab 07/04/2023 Cycle 2 rituximab 07/11/2023 Cycle 3 rituximab 07/18/2023 Prednisone decreased to 5 mg daily 07/25/2023 Cycle 4 rituximab 07/25/2023 Prednisone decreased to 5 mg every other day 08/15/2023 Leukopenia History of melanoma located left leg below the knee status post post excision 1998-completed an immunotherapy trial at [facility] Eosinophilic esophagitis Hypertension Anxiety History of colon polyps Hypercholesterolemia  Elevated IgM level Polyclonal hypergammaglobulinemia   Disposition: Carol Thomas appears stable.  The platelet count remains adequate.  She will taper the prednisone to 5 mg every other day.  She will return for a CBC in 3 weeks and an office visit in 6 weeks.  Thornton Papas, MD  08/15/2023  1:51 PM

## 2023-08-20 ENCOUNTER — Encounter: Payer: Self-pay | Admitting: Oncology

## 2023-08-23 ENCOUNTER — Telehealth: Payer: Self-pay | Admitting: *Deleted

## 2023-08-23 NOTE — Telephone Encounter (Signed)
Does patient need to fast for Monday? Lipids done 6 months ago.

## 2023-08-23 NOTE — Telephone Encounter (Signed)
Patient advised.

## 2023-08-23 NOTE — Telephone Encounter (Signed)
No.  As long as she is taking the rosuvastatin the same way, no need

## 2023-08-24 NOTE — Progress Notes (Unsigned)
No chief complaint on file.  Carol Thomas is a 67 y.o. female who presents for Medicare wellness visit and follow-up on chronic medical conditions.    Hypertension. She continues on amlodipine 10mg  and losartan-HCT 100-25.  She denies side effects to medications. She occasionally has a mild headache. She denies dizziness, chest pain, palpitations, shortness of breath, edema (rare). Denies exertional chest pain or dyspnea.  BP's are running  BP Readings from Last 3 Encounters:  08/15/23 134/79  07/25/23 112/64  07/25/23 137/84     Anxiety: She continues on citalopram 20mg .    At her last visit, her mother-in-law (whom she cares for in her home, s/p CVA) had been in rehab s/p fractured clavicle after a fall. In [place] [facility] or elsehwere, or back home??? ***   Grandsons and her son are still living with his girlfriend. It is just her and her husband at home    Thrombocytopenia/ITP:  under the care of Dr. Truett Perna, last seen 08/15/23. She has completed rituximab x 4, and was on 5 mg of prednisone daily at that time. She was tapered to 5 mg every other day at that visit. She denies any bleeding/bruising.  Lab Results  Component Value Date   WBC 5.0 08/15/2023   HGB 13.7 08/15/2023   HCT 39.2 08/15/2023   MCV 84.5 08/15/2023   PLT 124 (L) 08/15/2023      Hyperlipidemia: Patient's lipids had been well controlled on rosuvastatin. She had been concerned that her platelets might be low related to rosuvastatin, so was switched to pravastatin. Lipids were high, and platelets didn't improve.  She has been back on the rosuvastatin.  She denies side effects--no myalgias. She continues to follow a low cholesterol diet. Lipids were at goal on last check:  Lab Results  Component Value Date   CHOL 160 02/13/2023   HDL 72 02/13/2023   LDLCALC 74 02/13/2023   TRIG 71 02/13/2023   CHOLHDL 2.2 02/13/2023      EOE, GERD:  diagnosed/treated by Dr. Marca Ancona.  She is taking PPI. Denies  dysphagia.  She reports having regurgitation sometimes while working out, though not as bad as in the past.   Last visit she stated--She plans to f/u with GI this summer for repeat EGD. DIDN'T -UPDATE ***   H/o Impaired fasting glucose:  Fasting sugar was 107 in October 2022, 105 in 01/2022. Her recent glucose was 77 in 06/2023.  She has been tapering down on prednisone, on low dose now (was on 5 mg daily, lowered further 2 weeks ago). She tries to limit sugar/sweets/carbs. She has occasional chips (low sodium).  Cut back on pasta. No sugary beverages, other than 2 tsp of sugar in her coffee.    Lab Results  Component Value Date   HGBA1C 5.4 02/15/2023      Osteopenia: 12/2021 DEXA showed T-2.3 R fem neck; FRAX 19.3/3.5%. Prior DEXA was in 2021--T-2.4 at L fem neck in 2021. FRAX was normal.   Alendronate was started in April 2023. She reports tolerating this without side effects, no chest pain. She used to take a calcium supplement daily, but was told by Dr. Truett Perna to stop this. She continues to take vitamin D and gets regular weight bearing exercise.   H/o vitamin D deficiency: level was low at 19 in 2016.  Last check was normal at 49.8 in 07/2021, when taking 2000 IU daily.  She continues on same vitamins.     Immunization History  Administered Date(s) Administered  Hepatitis A 08/16/2006   Hepatitis A, Adult 10/02/2006, 04/12/2016   Influenza Inj Mdck Quad Pf 08/01/2017   Influenza,inj,Quad PF,6+ Mos 10/10/2012, 08/19/2014, 09/08/2015, 10/18/2016, 08/08/2018, 07/24/2019, 08/02/2020, 08/11/2021, 06/08/2022   Influenza-Unspecified 08/16/2017   PFIZER Comirnaty(Gray Top)Covid-19 Tri-Sucrose Vaccine 02/02/2021   PFIZER(Purple Top)SARS-COV-2 Vaccination 12/21/2019, 01/21/2020, 08/24/2020   PNEUMOCOCCAL CONJUGATE-20 08/17/2022   Pfizer Covid-19 Vaccine Bivalent Booster 33yrs & up 08/11/2021   Pfizer(Comirnaty)Fall Seasonal Vaccine 12 years and older 08/17/2022, 02/15/2023   Tdap  10/02/2006, 04/12/2016   Zoster Recombinant(Shingrix) 05/06/2018, 08/08/2018   Last Pap smear: 02/2014 Dr. Audie Box; s/p hysterectomy in 1997 Last mammogram: 02/2023 Last colonoscopy:  03/2022, 4 sessile serrated polyps (Dr. Marca Ancona). 3 year follow-up was recommended (per pt). She also had EGD 03/2022--duodenitis, esophagitis, and increased eosinophils noted (EOE) Last DEXA: 12/2021--T-2.3 R fem neck; FRAX 19.3/3.5%; prior was 11/2019 T-2.4 L fem neck, normal FRAX Dentist: twice yearly Ophtho: yearly Exercise:    She works out with Systems analyst 2x/week, 45-60 mins. Combo of cardio and weights.   Also works in her yard, heavy lifting for [employer] and pellets for stove. [place] 2-3x/week.  Patient Care Team: Joselyn Arrow, MD as PCP - General (Family Medicine) GI: Marca Ancona GYN: Fontaine (no longer sees) Derm: Dr. Wallace Cullens Ophtho: [facility] on [address] Dentist: Dr. Tenny Craw Hematologist: Dr. Truett Perna  Depression Screening:     02/15/2023   11:11 AM 08/17/2022   10:02 AM 02/02/2021    1:45 PM 08/02/2020    1:40 PM 07/24/2019    2:54 PM  Depression screen PHQ 2/9  Decreased Interest 0 0 0 0 0  Down, Depressed, Hopeless 0 1 0 0 0  PHQ - 2 Score 0 1 0 0 0  Altered sleeping 2 2     Tired, decreased energy 1 2     Change in appetite 0 3     Feeling bad or failure about yourself  1 1     Trouble concentrating 0 0     Moving slowly or fidgety/restless 0 0     Suicidal thoughts 0 0     PHQ-9 Score 4 9     Difficult doing work/chores Not difficult at all Not difficult at all         02/15/2023   11:12 AM 08/17/2022    9:50 AM 07/24/2019    3:19 PM  GAD 7 : Generalized Anxiety Score  Nervous, Anxious, on Edge 1 0 1  Control/stop worrying 1 3 0  Worry too much - different things 1 3 3   Trouble relaxing 0 1 0  Restless 0 3 0  Easily annoyed or irritable 1 2 0  Afraid - awful might happen 0 0 0  Total GAD 7 Score 4 12 4   Anxiety Difficulty  Not difficult at all Somewhat difficult      Falls screen:     02/15/2023   11:06 AM 08/17/2022    9:50 AM 02/08/2022   10:27 AM 02/02/2021    1:45 PM  Fall Risk   Falls in the past year? 0 0 1 0  Number falls in past yr: 0 0 0 0  Comment   02/01/22   Injury with Fall? 0 0 0 0  Comment   fell moving butcher block- fell backwards-buttbone hurt for a while   Risk for fall due to : No Fall Risks No Fall Risks History of fall(s) No Fall Risks  Follow up Falls evaluation completed Falls evaluation completed Falls evaluation completed Falls evaluation completed     Functional Status Survey:    Mild hearing loss--not significant enough for her to want to have it checked.  Thinks her husband speaks too quietly.       End of Life Discussion:  Patient does not have a living will and medical power of attorney  Recent labs reviewed:   Chemistry      Component Value Date/Time   NA 136 07/04/2023 0950   NA 139 02/13/2023 0943   K 3.5 07/04/2023 0950   CL 101 07/04/2023 0950   CO2 26 07/04/2023 0950   BUN 15 07/04/2023 0950   BUN 12 02/13/2023 0943   CREATININE 1.12 (H) 07/04/2023 0950   CREATININE 1.06 (H) 10/22/2017 0823      Component Value Date/Time   CALCIUM 9.4 07/04/2023 0950   ALKPHOS 47 07/04/2023 0950   AST 14 (L) 07/04/2023 0950   ALT 14 07/04/2023 0950   BILITOT 1.0 07/04/2023 0950     Lab Results  Component Value Date   WBC 5.0 08/15/2023   HGB 13.7 08/15/2023   HCT 39.2 08/15/2023   MCV 84.5 08/15/2023   PLT 124 (L) 08/15/2023   Lab Results  Component Value Date   CHOL 160 02/13/2023   HDL 72 02/13/2023   LDLCALC 74 02/13/2023   TRIG 71 02/13/2023   CHOLHDL 2.2 02/13/2023     PMH, PSH, SH and FH were reviewed and updated     ROS:  The patient denies anorexia, fever, weight changes, decreased hearing, ear pain, sore throat, breast concerns, chest pain, palpitations, dizziness, syncope, dyspnea on exertion, cough, swelling, nausea, vomiting,  diarrhea, constipation, abdominal pain, melena, hematochezia, hematuria, incontinence, dysuria, vaginal bleeding, discharge, odor or itch, genital lesions, joint pains, numbness, tingling, weakness, tremor, suspicious skin lesions, depression, abnormal bleeding/bruising, or enlarged lymph nodes.   Occasional headaches.  Only mild, intermittent snoring per pt. No known apnea, denies daytime somnolence. Feels refreshed in the mornings.   GERD symptoms per HPI. Occ cough from reflux. Sees dermatologist regularly (h/o melanoma).   PHYSICAL EXAM:  There were no vitals taken for this visit.  Wt Readings from Last 3 Encounters:  08/15/23 200 lb 9.6 oz (91 kg)  07/25/23 200 lb (90.7 kg)  07/18/23 195 lb 1.6 oz (88.5 kg)   General Appearance:    Alert, cooperative, no distress, appears stated age  Head:    Normocephalic, without obvious abnormality, atraumatic  Eyes:    PERRL, conjunctiva/corneas clear, EOM's intact, fundi benign  Ears:    Normal TM's and external ear canals  Nose:   No drainage or sinus tenderness  Throat:   Normal mucosa, no lesions  Neck:   Supple, no lymphadenopathy;  thyroid:  no enlargement/ tenderness/nodules; no carotid bruit or JVD  Back:    Spine nontender, no curvature, ROM normal, no CVA  tenderness  Lungs:     Clear to auscultation bilaterally without wheezes, rales or    ronchi; respirations unlabored  Chest Wall:    No tenderness or deformity   Heart:    Regular rate and rhythm, S1 and S2 normal, no murmur, rub   or gallop  Breast Exam:    No tenderness, masses, or nipple discharge or inversion.   No axillary lymphadenopathy  Abdomen:     Soft, non-tender, nondistended, normoactive bowel sounds,    no masses, no hepatosplenomegaly  Genitalia:    Normal external genitalia without  lesions.  BUS and vagina normal. No abnormal vaginal discharge.  Uterus is surgically absent; adnexa not enlarged, nontender, no masses.   Rectal:    Normal tone, no masses or  tenderness; guaiac negative stool  Extremities:   No clubbing, cyanosis or edema  Pulses:   2+ and symmetric all extremities  Skin:   Skin color, texture, turgor normal, no rashes or lesions  Lymph nodes:   Cervical, supraclavicular, inguinal and axillary nodes normal  Neurologic:   Normal strength, sensation and gait; reflexes 2+ and symmetric throughout                              Psych:   Normal mood, hygiene and grooming.      ASSESSMENT/PLAN:  Phq-9, GAD-7  Flu/COVID Did she ever get RSV vaccine?  Pretty sure we did MOST form last year.  Doesn't look like it was scanned into chart. Has she done living will/HCPOA forms?  Order DEXA for 12/2023 (2 yr f/u osteopenia)  No labs needed unless thyroid sx or change in D supplement   Discussed monthly self breast exams and yearly mammograms; at least 30 minutes of aerobic activity at least 5 days/week and weight-bearing exercise at least 2-3 days/week; proper sunscreen use reviewed; healthy diet, including goals of calcium and vitamin D intake and alcohol recommendations (less than or equal to 1 drink/day) reviewed; regular seatbelt use; changing batteries in smoke detectors.  Immunization recommendations discussed--continue yearly high dose flu shots, given today.  COVID booster given today. RSV vaccine recommended from pharmacy. *** Colonoscopy recommendations reviewed, UTD, due 03/2025. DEXA due 12/2023  MOST form reviewed, completed. Full code, full care. Discussed living will and healthcare power of attorney, forms given. Asked for copies when completed, to be scanned into medical chart.    F/u 6 mos med check, sooner as needed. ?fasting or labs prior?***   Medicare Attestation I have personally reviewed: The patient's medical and social history Their use of alcohol, tobacco or illicit drugs Their current medications and supplements The patient's functional ability including ADLs,fall risks, home safety risks, cognitive,  and hearing and visual impairment Diet and physical activities Evidence for depression or mood disorders  The patient's weight, height, BMI have been recorded in the chart.  I have made referrals, counseling, and provided education to the patient based on review of the above and I have provided the patient with a written personalized care plan for preventive services.     Lavonda Jumbo, MD

## 2023-08-24 NOTE — Patient Instructions (Incomplete)
HEALTH MAINTENANCE RECOMMENDATIONS:  It is recommended that you get at least 30 minutes of aerobic exercise at least 5 days/week (for weight loss, you may need as much as 60-90 minutes). This can be any activity that gets your heart rate up. This can be divided in 10-15 minute intervals if needed, but try and build up your endurance at least once a week.  Weight bearing exercise is also recommended twice weekly.  Eat a healthy diet with lots of vegetables, fruits and fiber.  "Colorful" foods have a lot of vitamins (ie green vegetables, tomatoes, red peppers, etc).  Limit sweet tea, regular sodas and alcoholic beverages, all of which has a lot of calories and sugar.  Up to 1 alcoholic drink daily may be beneficial for women (unless trying to lose weight, watch sugars).  Drink a lot of water.  Calcium recommendations are 1200-1500 mg daily (1500 mg for postmenopausal women or women without ovaries), and vitamin D 1000 IU daily.  This should be obtained from diet and/or supplements (vitamins), and calcium should not be taken all at once, but in divided doses.  Monthly self breast exams and yearly mammograms for women over the age of 20 is recommended.  Sunscreen of at least SPF 30 should be used on all sun-exposed parts of the skin when outside between the hours of 10 am and 4 pm (not just when at beach or pool, but even with exercise, golf, tennis, and yard work!)  Use a sunscreen that says "broad spectrum" so it covers both UVA and UVB rays, and make sure to reapply every 1-2 hours.  Remember to change the batteries in your smoke detectors when changing your clock times in the spring and fall. Carbon monoxide detectors are recommended for your home.  Use your seat belt every time you are in a car, and please drive safely and not be distracted with cell phones and texting while driving.   Carol Thomas , Thank you for taking time to come for your Medicare Wellness Visit. I appreciate your ongoing  commitment to your health goals. Please review the following plan we discussed and let me know if I can assist you in the future.   This is a list of the screening recommended for you and due dates:  Health Maintenance  Topic Date Due   Flu Shot  05/17/2023   COVID-19 Vaccine (8 - 2023-24 season) 06/17/2023   Medicare Annual Wellness Visit  08/18/2023   Mammogram  02/28/2024   Colon Cancer Screening  04/03/2025   DTaP/Tdap/Td vaccine (3 - Td or Tdap) 04/12/2026   Pneumonia Vaccine  Completed   DEXA scan (bone density measurement)  Completed   Hepatitis C Screening  Completed   Zoster (Shingles) Vaccine  Completed   HPV Vaccine  Aged Out   I recommend getting the RSV vaccine from the pharmacy, waiting about 2 weeks from today's vaccines.  It doesn't sound like you are getting nearly enough calcium from your diet, and were told by Dr. Truett Perna not to take any supplements.  Feel free to verify this with him.  Please start drinking a non-dairy milk (almond milk or soy), which has about 450 mg/8 ounce glass.  Since you tolerate [place] yogurt, you should have this daily.  And look at the attached list for other sources.  Cut back on smoothies--too much sugar, doesn't have the fiber that eating the fruit does, and if it doesn't have milk/yogurt in it, then you aren't getting any calcium.  Please schedule your follow-up bone density test for April 2024.  Please resume trying to get regular aerobic exercise--start slow, and be kind to yourself.  Work yourself back up to a total of 30 minutes most days.  Please bring [place] copies of your Living Will and Healthcare Power of Attorney once completed and notarized so that it can be scanned into your medical chart.

## 2023-08-27 ENCOUNTER — Ambulatory Visit (INDEPENDENT_AMBULATORY_CARE_PROVIDER_SITE_OTHER): Payer: Medicare Other | Admitting: Family Medicine

## 2023-08-27 ENCOUNTER — Encounter: Payer: Self-pay | Admitting: Family Medicine

## 2023-08-27 VITALS — BP 134/84 | HR 64 | Ht 66.0 in | Wt 203.2 lb

## 2023-08-27 DIAGNOSIS — Z9189 Other specified personal risk factors, not elsewhere classified: Secondary | ICD-10-CM | POA: Diagnosis not present

## 2023-08-27 DIAGNOSIS — R7301 Impaired fasting glucose: Secondary | ICD-10-CM

## 2023-08-27 DIAGNOSIS — E661 Drug-induced obesity: Secondary | ICD-10-CM

## 2023-08-27 DIAGNOSIS — M85852 Other specified disorders of bone density and structure, left thigh: Secondary | ICD-10-CM

## 2023-08-27 DIAGNOSIS — E66811 Obesity, class 1: Secondary | ICD-10-CM

## 2023-08-27 DIAGNOSIS — Z5181 Encounter for therapeutic drug level monitoring: Secondary | ICD-10-CM

## 2023-08-27 DIAGNOSIS — F411 Generalized anxiety disorder: Secondary | ICD-10-CM

## 2023-08-27 DIAGNOSIS — I700 Atherosclerosis of aorta: Secondary | ICD-10-CM

## 2023-08-27 DIAGNOSIS — I10 Essential (primary) hypertension: Secondary | ICD-10-CM

## 2023-08-27 DIAGNOSIS — Z0000 Encounter for general adult medical examination without abnormal findings: Secondary | ICD-10-CM

## 2023-08-27 DIAGNOSIS — D693 Immune thrombocytopenic purpura: Secondary | ICD-10-CM

## 2023-08-27 DIAGNOSIS — K200 Eosinophilic esophagitis: Secondary | ICD-10-CM | POA: Diagnosis not present

## 2023-08-27 DIAGNOSIS — Z23 Encounter for immunization: Secondary | ICD-10-CM

## 2023-08-27 DIAGNOSIS — E7800 Pure hypercholesterolemia, unspecified: Secondary | ICD-10-CM

## 2023-08-27 MED ORDER — ROSUVASTATIN CALCIUM 10 MG PO TABS: 10.0000 mg | ORAL_TABLET | Freq: Every day | ORAL | 1 refills | Status: DC

## 2023-08-27 MED ORDER — AMLODIPINE BESYLATE 10 MG PO TABS
10.0000 mg | ORAL_TABLET | Freq: Every day | ORAL | 1 refills | Status: DC
Start: 2023-08-27 — End: 2024-04-07

## 2023-08-27 MED ORDER — CITALOPRAM HYDROBROMIDE 20 MG PO TABS
20.0000 mg | ORAL_TABLET | Freq: Every day | ORAL | 3 refills | Status: DC
Start: 2023-08-27 — End: 2024-04-07

## 2023-08-27 MED ORDER — LOSARTAN POTASSIUM-HCTZ 100-25 MG PO TABS: 1.0000 | ORAL_TABLET | Freq: Every day | ORAL | 1 refills | Status: DC

## 2023-08-27 MED ORDER — ALENDRONATE SODIUM 70 MG PO TABS: 70.0000 mg | ORAL_TABLET | ORAL | 1 refills | Status: DC

## 2023-09-04 ENCOUNTER — Other Ambulatory Visit: Payer: Self-pay | Admitting: Family Medicine

## 2023-09-04 DIAGNOSIS — Z1231 Encounter for screening mammogram for malignant neoplasm of breast: Secondary | ICD-10-CM

## 2023-09-05 ENCOUNTER — Inpatient Hospital Stay: Payer: Medicare Other | Attending: Nurse Practitioner

## 2023-09-05 ENCOUNTER — Telehealth: Payer: Self-pay

## 2023-09-05 DIAGNOSIS — D696 Thrombocytopenia, unspecified: Secondary | ICD-10-CM

## 2023-09-05 DIAGNOSIS — D693 Immune thrombocytopenic purpura: Secondary | ICD-10-CM | POA: Insufficient documentation

## 2023-09-05 LAB — CBC WITH DIFFERENTIAL (CANCER CENTER ONLY)
Abs Immature Granulocytes: 0.01 10*3/uL (ref 0.00–0.07)
Basophils Absolute: 0 10*3/uL (ref 0.0–0.1)
Basophils Relative: 1 %
Eosinophils Absolute: 0.2 10*3/uL (ref 0.0–0.5)
Eosinophils Relative: 4 %
HCT: 38.8 % (ref 36.0–46.0)
Hemoglobin: 13.4 g/dL (ref 12.0–15.0)
Immature Granulocytes: 0 %
Lymphocytes Relative: 18 %
Lymphs Abs: 0.7 10*3/uL (ref 0.7–4.0)
MCH: 29.8 pg (ref 26.0–34.0)
MCHC: 34.5 g/dL (ref 30.0–36.0)
MCV: 86.2 fL (ref 80.0–100.0)
Monocytes Absolute: 0.3 10*3/uL (ref 0.1–1.0)
Monocytes Relative: 7 %
Neutro Abs: 2.7 10*3/uL (ref 1.7–7.7)
Neutrophils Relative %: 70 %
Platelet Count: 123 10*3/uL — ABNORMAL LOW (ref 150–400)
RBC: 4.5 MIL/uL (ref 3.87–5.11)
RDW: 13.7 % (ref 11.5–15.5)
WBC Count: 3.9 10*3/uL — ABNORMAL LOW (ref 4.0–10.5)
nRBC: 0 % (ref 0.0–0.2)

## 2023-09-05 NOTE — Telephone Encounter (Signed)
Patient gave verbal understanding and had no further questions or concerns  

## 2023-09-05 NOTE — Telephone Encounter (Signed)
-----   Message from Thornton Papas sent at 09/05/2023  2:31 PM EST ----- Please call patient, the platelet count is stable, discontinue prednisone, follow-up as scheduled, call for bleeding or bruising

## 2023-09-26 ENCOUNTER — Inpatient Hospital Stay: Payer: Medicare Other | Attending: Nurse Practitioner | Admitting: Nurse Practitioner

## 2023-09-26 ENCOUNTER — Inpatient Hospital Stay: Payer: Medicare Other

## 2023-09-26 ENCOUNTER — Encounter: Payer: Self-pay | Admitting: Nurse Practitioner

## 2023-09-26 VITALS — BP 121/79 | HR 79 | Temp 98.1°F | Resp 18 | Ht 66.0 in | Wt 200.7 lb

## 2023-09-26 DIAGNOSIS — D696 Thrombocytopenia, unspecified: Secondary | ICD-10-CM

## 2023-09-26 DIAGNOSIS — I10 Essential (primary) hypertension: Secondary | ICD-10-CM | POA: Insufficient documentation

## 2023-09-26 DIAGNOSIS — D693 Immune thrombocytopenic purpura: Secondary | ICD-10-CM | POA: Diagnosis present

## 2023-09-26 DIAGNOSIS — Z85820 Personal history of malignant melanoma of skin: Secondary | ICD-10-CM | POA: Diagnosis not present

## 2023-09-26 DIAGNOSIS — E7800 Pure hypercholesterolemia, unspecified: Secondary | ICD-10-CM | POA: Insufficient documentation

## 2023-09-26 DIAGNOSIS — D72819 Decreased white blood cell count, unspecified: Secondary | ICD-10-CM | POA: Insufficient documentation

## 2023-09-26 DIAGNOSIS — R768 Other specified abnormal immunological findings in serum: Secondary | ICD-10-CM | POA: Insufficient documentation

## 2023-09-26 DIAGNOSIS — F419 Anxiety disorder, unspecified: Secondary | ICD-10-CM | POA: Insufficient documentation

## 2023-09-26 LAB — CBC WITH DIFFERENTIAL (CANCER CENTER ONLY)
Abs Immature Granulocytes: 0.01 10*3/uL (ref 0.00–0.07)
Basophils Absolute: 0 10*3/uL (ref 0.0–0.1)
Basophils Relative: 1 %
Eosinophils Absolute: 0.2 10*3/uL (ref 0.0–0.5)
Eosinophils Relative: 6 %
HCT: 38 % (ref 36.0–46.0)
Hemoglobin: 13.2 g/dL (ref 12.0–15.0)
Immature Granulocytes: 0 %
Lymphocytes Relative: 20 %
Lymphs Abs: 0.8 10*3/uL (ref 0.7–4.0)
MCH: 29.4 pg (ref 26.0–34.0)
MCHC: 34.7 g/dL (ref 30.0–36.0)
MCV: 84.6 fL (ref 80.0–100.0)
Monocytes Absolute: 0.3 10*3/uL (ref 0.1–1.0)
Monocytes Relative: 6 %
Neutro Abs: 2.7 10*3/uL (ref 1.7–7.7)
Neutrophils Relative %: 67 %
Platelet Count: 98 10*3/uL — ABNORMAL LOW (ref 150–400)
RBC: 4.49 MIL/uL (ref 3.87–5.11)
RDW: 12.8 % (ref 11.5–15.5)
WBC Count: 4 10*3/uL (ref 4.0–10.5)
nRBC: 0 % (ref 0.0–0.2)

## 2023-09-26 NOTE — Progress Notes (Signed)
  Carol Thomas OFFICE PROGRESS NOTE   Diagnosis: ITP  INTERVAL HISTORY:   Carol Thomas returns as scheduled.  She discontinued prednisone 09/05/2023.  She notes her appetite has decreased.  She fatigues easily.  No bruising or bleeding.  Objective:  Vital signs in last 24 hours:  Blood pressure 121/79, pulse 79, temperature 98.1 F (36.7 C), temperature source Temporal, resp. rate 18, height 5\' 6"  (1.676 m), weight 200 lb 11.2 oz (91 kg), SpO2 100%.    HEENT: No blood in the mouth.  No thrush. Resp: Lungs clear bilaterally. Cardio: Regular rate and rhythm. GI: No hepatosplenomegaly. Vascular: No leg edema. Skin: No petechiae.   Lab Results:  Lab Results  Component Value Date   WBC 4.0 09/26/2023   HGB 13.2 09/26/2023   HCT 38.0 09/26/2023   MCV 84.6 09/26/2023   PLT 98 (L) 09/26/2023   NEUTROABS 2.7 09/26/2023    Imaging:  No results found.  Medications: I have reviewed the patient's current medications.  Assessment/Plan: Thrombocytopenia-progressive 09/20/2022 Admission 09/20/2022 with severe thrombocytopenia, prednisone 09/20/2022, Solu-Medrol 1 mg/kilogram daily start 09/20/2022 Bone marrow biopsy 09/21/2022-hypercellular marrow for age, increased megakaryocytes, no evidence of a lymphoproliferative disorder Prednisone 80 mg daily 09/22/2022 Prednisone 40 mg daily 09/26/2022 Prednisone 30 mg daily 10/04/2022 Prednisone 20 mg daily 11/01/2022 Prednisone 15 mg daily 11/23/2022 Prednisone 10 mg daily 01/03/2023 Prednisone 5 mg daily 01/26/2023 Prednisone 2.5 mg daily 02/22/2023 Prednisone 5 mg daily 03/14/2023 Prednisone 5 alternating with 2.5 mg daily 04/03/2023 Prednisone 40 mg daily X 3, then 20 mg daily start 05/24/2023 Prednisone decreased to 10 mg daily 06/27/2023 Prednisone decreased to 10 mg alternating with 5 mg daily 07/04/2023 Cycle 1 rituximab 07/04/2023 Cycle 2 rituximab 07/11/2023 Cycle 3 rituximab 07/18/2023 Prednisone decreased to 5 mg daily  07/25/2023 Cycle 4 rituximab 07/25/2023 Prednisone decreased to 5 mg every other day 08/15/2023 Prednisone discontinued 09/05/2023 Leukopenia History of melanoma located left leg below the knee status post post excision 1998-completed an immunotherapy trial at [facility] Eosinophilic esophagitis Hypertension Anxiety History of colon polyps Hypercholesterolemia  Elevated IgM level Polyclonal hypergammaglobulinemia  Disposition: Carol Thomas appears stable.  She discontinued prednisone 09/05/2023.  CBC from today shows mild thrombocytopenia.  Plan at present is to continue to monitor.  Follow-up CBC in 2 to 3 weeks.  She understands to contact the office with bruising/bleeding.  She agrees with this plan.  Lab and office visit in 6 weeks.    Lonna Cobb ANP/GNP-BC   09/26/2023  9:36 AM

## 2023-10-16 ENCOUNTER — Telehealth: Payer: Self-pay

## 2023-10-16 ENCOUNTER — Inpatient Hospital Stay: Payer: Medicare Other

## 2023-10-16 DIAGNOSIS — D696 Thrombocytopenia, unspecified: Secondary | ICD-10-CM

## 2023-10-16 DIAGNOSIS — D693 Immune thrombocytopenic purpura: Secondary | ICD-10-CM | POA: Diagnosis not present

## 2023-10-16 LAB — CBC WITH DIFFERENTIAL (CANCER CENTER ONLY)
Abs Immature Granulocytes: 0.01 10*3/uL (ref 0.00–0.07)
Basophils Absolute: 0 10*3/uL (ref 0.0–0.1)
Basophils Relative: 1 %
Eosinophils Absolute: 0.2 10*3/uL (ref 0.0–0.5)
Eosinophils Relative: 6 %
HCT: 37.3 % (ref 36.0–46.0)
Hemoglobin: 13 g/dL (ref 12.0–15.0)
Immature Granulocytes: 0 %
Lymphocytes Relative: 24 %
Lymphs Abs: 0.8 10*3/uL (ref 0.7–4.0)
MCH: 29.1 pg (ref 26.0–34.0)
MCHC: 34.9 g/dL (ref 30.0–36.0)
MCV: 83.4 fL (ref 80.0–100.0)
Monocytes Absolute: 0.3 10*3/uL (ref 0.1–1.0)
Monocytes Relative: 8 %
Neutro Abs: 2 10*3/uL (ref 1.7–7.7)
Neutrophils Relative %: 61 %
Platelet Count: 116 10*3/uL — ABNORMAL LOW (ref 150–400)
RBC: 4.47 MIL/uL (ref 3.87–5.11)
RDW: 12.4 % (ref 11.5–15.5)
WBC Count: 3.2 10*3/uL — ABNORMAL LOW (ref 4.0–10.5)
nRBC: 0 % (ref 0.0–0.2)

## 2023-10-16 NOTE — Telephone Encounter (Signed)
Patient gave verbal understanding and had no further questions or concerns  

## 2023-10-16 NOTE — Telephone Encounter (Signed)
-----   Message from Carol Thomas sent at 10/16/2023 10:34 AM EST ----- Please let her know the platelet count is higher.  Follow-up as scheduled.

## 2023-11-08 ENCOUNTER — Inpatient Hospital Stay: Payer: Medicare Other | Admitting: Oncology

## 2023-11-08 ENCOUNTER — Inpatient Hospital Stay: Payer: Medicare Other | Attending: Nurse Practitioner

## 2023-11-08 VITALS — BP 127/85 | HR 73 | Temp 97.9°F | Resp 18 | Ht 66.0 in | Wt 199.4 lb

## 2023-11-08 DIAGNOSIS — D696 Thrombocytopenia, unspecified: Secondary | ICD-10-CM

## 2023-11-08 DIAGNOSIS — D693 Immune thrombocytopenic purpura: Secondary | ICD-10-CM | POA: Insufficient documentation

## 2023-11-08 LAB — CBC WITH DIFFERENTIAL (CANCER CENTER ONLY)
Abs Immature Granulocytes: 0.01 10*3/uL (ref 0.00–0.07)
Basophils Absolute: 0 10*3/uL (ref 0.0–0.1)
Basophils Relative: 1 %
Eosinophils Absolute: 0.2 10*3/uL (ref 0.0–0.5)
Eosinophils Relative: 4 %
HCT: 39.4 % (ref 36.0–46.0)
Hemoglobin: 13.5 g/dL (ref 12.0–15.0)
Immature Granulocytes: 0 %
Lymphocytes Relative: 23 %
Lymphs Abs: 0.8 10*3/uL (ref 0.7–4.0)
MCH: 28.8 pg (ref 26.0–34.0)
MCHC: 34.3 g/dL (ref 30.0–36.0)
MCV: 84.2 fL (ref 80.0–100.0)
Monocytes Absolute: 0.3 10*3/uL (ref 0.1–1.0)
Monocytes Relative: 8 %
Neutro Abs: 2.4 10*3/uL (ref 1.7–7.7)
Neutrophils Relative %: 64 %
Platelet Count: 163 10*3/uL (ref 150–400)
RBC: 4.68 MIL/uL (ref 3.87–5.11)
RDW: 12.7 % (ref 11.5–15.5)
WBC Count: 3.7 10*3/uL — ABNORMAL LOW (ref 4.0–10.5)
nRBC: 0 % (ref 0.0–0.2)

## 2023-11-08 NOTE — Progress Notes (Signed)
  [facility] OFFICE PROGRESS NOTE   Diagnosis: ITP  INTERVAL HISTORY:   [name] returns as scheduled.  She mains off of prednisone.  No bleeding or spontaneous bruising.  She feels well.  She had a recent upper respiratory infection.  The symptoms have improved.  Objective:  Vital signs in last 24 hours:  Blood pressure 127/85, pulse 73, temperature 97.9 F (36.6 C), temperature source Temporal, resp. rate 18, height 5\' 6"  (1.676 m), weight 199 lb 6.4 oz (90.4 kg), SpO2 100%.     Lymphatics: No cervical, supraclavicular, axillary, or inguinal nodes Resp: Lungs clear bilaterally Cardio: Regular rate and rhythm GI: No hepatosplenomegaly Vascular: No leg edema  Lab Results:  Lab Results  Component Value Date   WBC 3.7 (L) 11/08/2023   HGB 13.5 11/08/2023   HCT 39.4 11/08/2023   MCV 84.2 11/08/2023   PLT 163 11/08/2023   NEUTROABS 2.4 11/08/2023    CMP  Lab Results  Component Value Date   NA 136 07/04/2023   K 3.5 07/04/2023   CL 101 07/04/2023   CO2 26 07/04/2023   GLUCOSE 77 07/04/2023   BUN 15 07/04/2023   CREATININE 1.12 (H) 07/04/2023   CALCIUM 9.4 07/04/2023   PROT 7.0 07/04/2023   ALBUMIN 4.3 07/04/2023   AST 14 (L) 07/04/2023   ALT 14 07/04/2023   ALKPHOS 47 07/04/2023   BILITOT 1.0 07/04/2023   GFRNONAA 54 (L) 07/04/2023   GFRAA 72 07/28/2020    Medications: I have reviewed the patient's current medications.   Assessment/Plan: Thrombocytopenia-progressive 09/20/2022 Admission 09/20/2022 with severe thrombocytopenia, prednisone 09/20/2022, Solu-Medrol 1 mg/kilogram daily start 09/20/2022 Bone marrow biopsy 09/21/2022-hypercellular marrow for age, increased megakaryocytes, no evidence of a lymphoproliferative disorder Prednisone 80 mg daily 09/22/2022 Prednisone 40 mg daily 09/26/2022 Prednisone 30 mg daily 10/04/2022 Prednisone 20 mg daily 11/01/2022 Prednisone 15 mg daily 11/23/2022 Prednisone 10 mg daily 01/03/2023 Prednisone 5 mg  daily 01/26/2023 Prednisone 2.5 mg daily 02/22/2023 Prednisone 5 mg daily 03/14/2023 Prednisone 5 alternating with 2.5 mg daily 04/03/2023 Prednisone 40 mg daily X 3, then 20 mg daily start 05/24/2023 Prednisone decreased to 10 mg daily 06/27/2023 Prednisone decreased to 10 mg alternating with 5 mg daily 07/04/2023 Cycle 1 rituximab 07/04/2023 Cycle 2 rituximab 07/11/2023 Cycle 3 rituximab 07/18/2023 Prednisone decreased to 5 mg daily 07/25/2023 Cycle 4 rituximab 07/25/2023 Prednisone decreased to 5 mg every other day 08/15/2023 Prednisone discontinued 09/05/2023 Leukopenia History of melanoma located left leg below the knee status post post excision 1998-completed an immunotherapy trial at [facility] Eosinophilic esophagitis Hypertension Anxiety History of colon polyps Hypercholesterolemia  Elevated IgM level Polyclonal hypergammaglobulinemia   Disposition: Carol Thomas has ITP.  She completed a course of rituximab in October and has been maintained off of prednisone since November.  The platelet count is normal.  She will return for a CBC in 3 months and an office visit in 6 months.  She will call for spontaneous bleeding or bruising.  Thornton Papas, MD  11/08/2023  10:22 AM

## 2024-01-31 ENCOUNTER — Other Ambulatory Visit: Payer: Self-pay | Admitting: Family Medicine

## 2024-01-31 DIAGNOSIS — Z9189 Other specified personal risk factors, not elsewhere classified: Secondary | ICD-10-CM

## 2024-01-31 DIAGNOSIS — E7800 Pure hypercholesterolemia, unspecified: Secondary | ICD-10-CM

## 2024-01-31 DIAGNOSIS — M85852 Other specified disorders of bone density and structure, left thigh: Secondary | ICD-10-CM

## 2024-01-31 DIAGNOSIS — I700 Atherosclerosis of aorta: Secondary | ICD-10-CM

## 2024-01-31 MED ORDER — ALENDRONATE SODIUM 70 MG PO TABS: 70.0000 mg | ORAL_TABLET | ORAL | 0 refills | Status: DC

## 2024-01-31 NOTE — Telephone Encounter (Signed)
 Spoke with patient and she does not need a refill on the crestor as she should have enough until her appt with Dr. Monnie Anthony on 5/28. She does need a refill on fosamax as she only has 2 pills left and it will not get her to her appt. I have went ahead and refilled for 30 days

## 2024-02-02 ENCOUNTER — Emergency Department (HOSPITAL_BASED_OUTPATIENT_CLINIC_OR_DEPARTMENT_OTHER)
Admission: EM | Admit: 2024-02-02 | Discharge: 2024-02-02 | Disposition: A | Discharge status: Home / Self Care | Source: Home / Self Care | Attending: Emergency Medicine | Admitting: Emergency Medicine

## 2024-02-02 ENCOUNTER — Observation Stay (HOSPITAL_BASED_OUTPATIENT_CLINIC_OR_DEPARTMENT_OTHER)
Admission: EM | Admit: 2024-02-02 | Discharge: 2024-02-04 | Disposition: A | Discharge status: Home / Self Care | Attending: Emergency Medicine | Admitting: Emergency Medicine

## 2024-02-02 ENCOUNTER — Other Ambulatory Visit: Payer: Self-pay

## 2024-02-02 ENCOUNTER — Encounter (HOSPITAL_BASED_OUTPATIENT_CLINIC_OR_DEPARTMENT_OTHER): Payer: Self-pay

## 2024-02-02 ENCOUNTER — Emergency Department (HOSPITAL_BASED_OUTPATIENT_CLINIC_OR_DEPARTMENT_OTHER)

## 2024-02-02 ENCOUNTER — Emergency Department (HOSPITAL_COMMUNITY)

## 2024-02-02 DIAGNOSIS — I639 Cerebral infarction, unspecified: Principal | ICD-10-CM | POA: Diagnosis present

## 2024-02-02 DIAGNOSIS — Z7982 Long term (current) use of aspirin: Secondary | ICD-10-CM | POA: Diagnosis not present

## 2024-02-02 DIAGNOSIS — E785 Hyperlipidemia, unspecified: Secondary | ICD-10-CM | POA: Diagnosis not present

## 2024-02-02 DIAGNOSIS — R55 Syncope and collapse: Secondary | ICD-10-CM | POA: Insufficient documentation

## 2024-02-02 DIAGNOSIS — D72819 Decreased white blood cell count, unspecified: Secondary | ICD-10-CM | POA: Diagnosis not present

## 2024-02-02 DIAGNOSIS — Z85820 Personal history of malignant melanoma of skin: Secondary | ICD-10-CM | POA: Diagnosis not present

## 2024-02-02 DIAGNOSIS — M5442 Lumbago with sciatica, left side: Secondary | ICD-10-CM | POA: Insufficient documentation

## 2024-02-02 DIAGNOSIS — N1831 Chronic kidney disease, stage 3a: Secondary | ICD-10-CM | POA: Diagnosis not present

## 2024-02-02 DIAGNOSIS — F39 Unspecified mood [affective] disorder: Secondary | ICD-10-CM | POA: Diagnosis not present

## 2024-02-02 DIAGNOSIS — N183 Chronic kidney disease, stage 3 unspecified: Secondary | ICD-10-CM | POA: Insufficient documentation

## 2024-02-02 DIAGNOSIS — Z79899 Other long term (current) drug therapy: Secondary | ICD-10-CM | POA: Insufficient documentation

## 2024-02-02 DIAGNOSIS — I10 Essential (primary) hypertension: Secondary | ICD-10-CM | POA: Diagnosis present

## 2024-02-02 DIAGNOSIS — R531 Weakness: Secondary | ICD-10-CM | POA: Diagnosis present

## 2024-02-02 DIAGNOSIS — K200 Eosinophilic esophagitis: Secondary | ICD-10-CM | POA: Diagnosis not present

## 2024-02-02 DIAGNOSIS — M8580 Other specified disorders of bone density and structure, unspecified site: Secondary | ICD-10-CM | POA: Diagnosis not present

## 2024-02-02 DIAGNOSIS — I6389 Other cerebral infarction: Secondary | ICD-10-CM | POA: Diagnosis not present

## 2024-02-02 DIAGNOSIS — R42 Dizziness and giddiness: Secondary | ICD-10-CM

## 2024-02-02 DIAGNOSIS — Z862 Personal history of diseases of the blood and blood-forming organs and certain disorders involving the immune mechanism: Secondary | ICD-10-CM

## 2024-02-02 DIAGNOSIS — Z8673 Personal history of transient ischemic attack (TIA), and cerebral infarction without residual deficits: Secondary | ICD-10-CM | POA: Diagnosis present

## 2024-02-02 LAB — CBC
HCT: 37.6 % (ref 36.0–46.0)
Hemoglobin: 13 g/dL (ref 12.0–15.0)
MCH: 28.8 pg (ref 26.0–34.0)
MCHC: 34.6 g/dL (ref 30.0–36.0)
MCV: 83.2 fL (ref 80.0–100.0)
Platelets: 109 10*3/uL — ABNORMAL LOW (ref 150–400)
RBC: 4.52 MIL/uL (ref 3.87–5.11)
RDW: 13.5 % (ref 11.5–15.5)
WBC: 3.7 10*3/uL — ABNORMAL LOW (ref 4.0–10.5)
nRBC: 0 % (ref 0.0–0.2)

## 2024-02-02 LAB — URINALYSIS, ROUTINE W REFLEX MICROSCOPIC
Bilirubin Urine: NEGATIVE
Glucose, UA: NEGATIVE mg/dL
Hgb urine dipstick: NEGATIVE
Ketones, ur: NEGATIVE mg/dL
Leukocytes,Ua: NEGATIVE
Nitrite: NEGATIVE
Protein, ur: NEGATIVE mg/dL
Specific Gravity, Urine: 1.006 (ref 1.005–1.030)
pH: 6.5 (ref 5.0–8.0)

## 2024-02-02 LAB — BASIC METABOLIC PANEL WITH GFR
Anion gap: 8 (ref 5–15)
BUN: 12 mg/dL (ref 8–23)
CO2: 27 mmol/L (ref 22–32)
Calcium: 10.4 mg/dL — ABNORMAL HIGH (ref 8.9–10.3)
Chloride: 101 mmol/L (ref 98–111)
Creatinine, Ser: 1.01 mg/dL — ABNORMAL HIGH (ref 0.44–1.00)
GFR, Estimated: >60 mL/min (ref >60)
Glucose, Bld: 101 mg/dL — ABNORMAL HIGH (ref 70–99)
Potassium: 4.7 mmol/L (ref 3.5–5.1)
Sodium: 136 mmol/L (ref 135–145)

## 2024-02-02 LAB — TROPONIN I (HIGH SENSITIVITY): Troponin I (High Sensitivity): 14 ng/L (ref <18)

## 2024-02-02 NOTE — ED Provider Notes (Signed)
 [place] EMERGENCY DEPARTMENT AT [facility] Provider Note   CSN: [id number] Arrival date & time: 02/02/24  1215     History  Chief Complaint  Patient presents with   Dizziness   Hypertension    Carol Thomas is a 68 y.o. female with a history of hypertension presenting to the ED with an episode of vertigo and weakness in her legs.  Patient reports that she was in her usual state of health this morning, around noon she was walking through the kitchen and began to feel suddenly vertiginous, like she needed to fall and hit the floor, but did not fall.  She said her left leg in particular felt weak and was trembling like it was gone to give out on her, than the right leg.  She says since coming to ED her symptoms have completely resolved.  She has walked to the bathroom steadily and has not have any further weakness in her legs.  She denies any chest pain, fevers, chills, coughing, recent infection symptoms.  She reports she has had a mild intermittent headache for several weeks which she attributes to stress, mostly frontal in nature.  She says that her son passed away recently which has been a source of significant stress, and her grandchildren now live with her.  HPI     Home Medications Prior to Admission medications   Medication Sig Start Date End Date Taking? Authorizing Provider  alendronate  (FOSAMAX ) 70 MG tablet Take 1 tablet (70 mg total) by mouth every 7 (seven) days. Take with a full glass of water on an empty stomach. 01/31/24   Roosvelt Colla, MD  amLODipine  (NORVASC ) 10 MG tablet Take 1 tablet (10 mg total) by mouth daily. 08/27/23   Roosvelt Colla, MD  Cholecalciferol  (VITAMIN D3) 50 MCG (2000 UT) TABS Take 2,000 Units by mouth in the morning.    [provider]  citalopram  (CELEXA ) 20 MG tablet Take 1 tablet (20 mg total) by mouth daily. 08/27/23   Roosvelt Colla, MD  co-enzyme Q-10 30 MG capsule Take 30 mg by mouth daily.    [provider]   losartan -hydrochlorothiazide  (HYZAAR) 100-25 MG tablet Take 1 tablet by mouth daily. 08/27/23   Roosvelt Colla, MD  pantoprazole  (PROTONIX ) 40 MG tablet Take 40 mg by mouth 2 (two) times daily. 06/13/17   [provider]  rosuvastatin  (CRESTOR ) 10 MG tablet Take 1 tablet (10 mg total) by mouth daily. 08/27/23   Roosvelt Colla, MD  SYSTANE COMPLETE PF 0.6 % SOLN Place 1 drop into both eyes 3 (three) times daily as needed (for dryness or irritation).    [provider]      Allergies    Other    Review of Systems   Review of Systems  Physical Exam Updated Vital Signs BP (!) 149/79   Pulse (!) 57   Temp 98 F (36.7 C) (Oral)   Resp 13   Ht 5\' 6"  (1.676 m)   Wt 90.4 kg   SpO2 98%   BMI 32.17 kg/m  Physical Exam Constitutional:      General: She is not in acute distress. HENT:     Head: Normocephalic and atraumatic.  Eyes:     Conjunctiva/sclera: Conjunctivae normal.     Pupils: Pupils are equal, round, and reactive to light.  Cardiovascular:     Rate and Rhythm: Normal rate and regular rhythm.  Pulmonary:     Effort: Pulmonary effort is normal. No respiratory distress.  Abdominal:  General: There is no distension.     Tenderness: There is no abdominal tenderness.  Skin:    General: Skin is warm and dry.  Neurological:     General: No focal deficit present.     Mental Status: She is alert and oriented to person, place, and time. Mental status is at baseline.     Sensory: No sensory deficit.     Motor: No weakness.  Psychiatric:        Mood and Affect: Mood normal.        Behavior: Behavior normal.     ED Results / Procedures / Treatments   Labs (all labs ordered are listed, but only abnormal results are displayed) Labs Reviewed  URINALYSIS, ROUTINE W REFLEX MICROSCOPIC - Abnormal; Notable for the following components:      Result Value   Color, Urine COLORLESS (*)    All other components within normal limits  BASIC METABOLIC PANEL WITH GFR -  Abnormal; Notable for the following components:   Glucose, Bld 101 (*)    Creatinine, Ser 1.01 (*)    Calcium  10.4 (*)    All other components within normal limits  CBC - Abnormal; Notable for the following components:   WBC 3.7 (*)    Platelets 109 (*)    All other components within normal limits  TROPONIN I (HIGH SENSITIVITY)    EKG EKG Interpretation Date/Time:  Saturday February 02 2024 12:34:07 EDT Ventricular Rate:  62 PR Interval:  174 QRS Duration:  99 QT Interval:  478 QTC Calculation: 486 R Axis:   10  Text Interpretation: Sinus rhythm Borderline low voltage, extremity leads Borderline prolonged QT interval Confirmed by Jerald Molly [phone]) on 02/02/2024 12:59:58 PM  Radiology CT Head Wo Contrast Result Date: 02/02/2024 CLINICAL DATA:  Head headache, increasing frequency or severity. Dizziness over the last 3 days. EXAM: CT HEAD WITHOUT CONTRAST TECHNIQUE: Contiguous axial images were obtained from the base of the skull through the vertex without intravenous contrast. RADIATION DOSE REDUCTION: This exam was performed according to the departmental dose-optimization program which includes automated exposure control, adjustment of the mA and/or kV according to patient size and/or use of iterative reconstruction technique. COMPARISON:  None Available. FINDINGS: Brain: The brain shows a normal appearance without evidence of malformation, atrophy, old or acute small or large vessel infarction, mass lesion, hemorrhage, hydrocephalus or extra-axial collection. Vascular: No hyperdense vessel. No evidence of atherosclerotic calcification. Skull: Normal.  No traumatic finding.  No focal bone lesion. Sinuses/Orbits: Sinuses are clear. Orbits appear normal. Mastoids are clear. Other: Middle ears and mastoid air cells are clear. IMPRESSION: Normal head CT. Electronically Signed   By: Bettylou Brunner M.D.   On: 02/02/2024 15:16    Procedures Procedures    Medications Ordered in  ED Medications - No data to display  ED Course/ Medical Decision Making/ A&P                                 Medical Decision Making Amount and/or Complexity of Data Reviewed Labs: ordered. Radiology: ordered.   This patient presents to the ED with concern for the anus and leg weakness this involves an extensive number of treatment options, and is a complaint that carries with it a high risk of complications and morbidity.  The differential diagnosis includes to go versus near syncope versus electrolyte derangement or anemia versus arrhythmia versus other  Co-morbidities that complicate the patient  evaluation: Age and high blood pressure cardiovascular risk factors  External records from outside source obtained and reviewed including coronary CT scan 2023 with a calcium  score of 0  I ordered and personally interpreted labs.  The pertinent results include: No emergent findings  I ordered imaging studies including CT scan of the head I independently visualized and interpreted imaging which showed no emergent findings I agree with the radiologist interpretation  The patient was maintained on a cardiac monitor.  I personally viewed and interpreted the cardiac monitored which showed an underlying rhythm of: Sinus rhythm  Per my interpretation the patient's ECG shows sinus rhythm with no acute ischemic findings  I have reviewed the patients home medicines and have made adjustments as needed  Test Considered: No indication for MRI, doubt TIA or stroke.  Low suspicion for acute PE   After the interventions noted above, I reevaluated the patient and found that they have: improved  - Patient remains asymptomatic and stable vital signs here in the ED with some very mild hypertension, no hypoxia, no bradycardia or arrhythmia on telemetry.  Dispostion:  After consideration of the diagnostic results and the patients response to treatment, I feel that the patent would benefit from close  outpatient follow-up         Final Clinical Impression(s) / ED Diagnoses Final diagnoses:  Near syncope    Rx / DC Orders ED Discharge Orders     None         Nneoma Harral, Janalyn Me, MD 02/02/24 [id number]

## 2024-02-02 NOTE — ED Triage Notes (Signed)
 Patient arrives POV with complaints of left left leg weakness. Patient states that she was sitting down and when she stood up her left hip/leg "gave way".  Seen earlier today for similar symptoms.

## 2024-02-02 NOTE — ED Triage Notes (Signed)
 Patient arrives POV with complaints of worsening dizziness x3 days. Patient also reports having an episode when both of her legs went weak and she almost fell. Noted her BP to be elevated at home as well (150's systolic).

## 2024-02-02 NOTE — ED Notes (Signed)
 Pt alert and oriented X 4 at the time of discharge. RR even and unlabored. No acute distress noted. Pt verbalized understanding of discharge instructions as discussed. Pt ambulatory to lobby at time of discharge.

## 2024-02-02 NOTE — ED Provider Notes (Signed)
 68 year old female transferred from [facility] for MRI. Patient states she was at home this morning around 1030 am when her head felt woozy and her left leg gave out/felt like jello. She went to [facility], had CT scan and work up and was dc. She left and went to the diner but when she left the diner around 3:30PM she felt like her left gave out on her again. She returned to [facility] and was sent to [facility] for MRI. Continues to have pain in the left leg.   Recently traveled to [place] (01/16/24-01/30/24). Does see a chiropractor (last visit 01-31-24). Found her son deceased in the yard recently, felt like her symptoms may be grief related.   Hx ITP, EoE. Has been on steroids for over 1 year, has had 4 infusions in November for the ITP, plt 163 in Jan and told in remission, noted today her plt are back down to 109.  Physical Exam  BP 127/86 (BP Location: Right Arm)   Pulse 72   Temp 98.3 F (36.8 C)   Resp 18   Ht 5\' 6"  (1.676 m)   Wt 90.4 kg   SpO2 99%   BMI 32.17 kg/m   Physical Exam  Procedures  Procedures  ED Course / MDM    Medical Decision Making Amount and/or Complexity of Data Reviewed Radiology: ordered.   ***

## 2024-02-02 NOTE — ED Provider Notes (Signed)
 [place] EMERGENCY DEPARTMENT AT [facility] Provider Note   CSN: [id number] Arrival date & time: 02/02/24  1640     History  Chief Complaint  Patient presents with   Extremity Weakness   Back Pain    Carol Thomas is a 68 y.o. female history of hypertension, ITP, here presenting with back pain and weakness.  Patient was seen here earlier.  She states that she had some vertigo and also left leg gave out.  Patient had unremarkable workup including CT head and labs.  Patient felt better and was discharged.  She then went to a restaurant and tried to get up and then her left leg gave out again.  She states that she has chronic back pain and went to a chiropractor about a month ago.  Denies any previous injections to the back.  Denies any incontinence or saddle anesthesia.  Patient denies any vertiginous symptoms at the restaurant.  The history is provided by the patient.       Home Medications Prior to Admission medications   Medication Sig Start Date End Date Taking? Authorizing Provider  alendronate  (FOSAMAX ) 70 MG tablet Take 1 tablet (70 mg total) by mouth every 7 (seven) days. Take with a full glass of water on an empty stomach. 01/31/24   Roosvelt Colla, MD  amLODipine  (NORVASC ) 10 MG tablet Take 1 tablet (10 mg total) by mouth daily. 08/27/23   Roosvelt Colla, MD  Cholecalciferol  (VITAMIN D3) 50 MCG (2000 UT) TABS Take 2,000 Units by mouth in the morning.    [provider]  citalopram  (CELEXA ) 20 MG tablet Take 1 tablet (20 mg total) by mouth daily. 08/27/23   Roosvelt Colla, MD  co-enzyme Q-10 30 MG capsule Take 30 mg by mouth daily.    [provider]  losartan -hydrochlorothiazide  (HYZAAR) 100-25 MG tablet Take 1 tablet by mouth daily. 08/27/23   Roosvelt Colla, MD  pantoprazole  (PROTONIX ) 40 MG tablet Take 40 mg by mouth 2 (two) times daily. 06/13/17   [provider]  rosuvastatin  (CRESTOR ) 10 MG tablet Take 1 tablet (10 mg total) by mouth daily.  08/27/23   Roosvelt Colla, MD  SYSTANE COMPLETE PF 0.6 % SOLN Place 1 drop into both eyes 3 (three) times daily as needed (for dryness or irritation).    [provider]      Allergies    Other    Review of Systems   Review of Systems  Musculoskeletal:  Positive for back pain.  All other systems reviewed and are negative.   Physical Exam Updated Vital Signs BP (!) 157/72 (BP Location: Right Arm)   Pulse 77   Temp 98.2 F (36.8 C) (Oral)   Resp 20   Ht 5\' 6"  (1.676 m)   Wt 90.4 kg   SpO2 98%   BMI 32.17 kg/m  Physical Exam Vitals and nursing note reviewed.  Constitutional:      Appearance: Normal appearance.  HENT:     Head: Normocephalic.     Nose: Nose normal.     Mouth/Throat:     Mouth: Mucous membranes are moist.  Eyes:     Extraocular Movements: Extraocular movements intact.     Pupils: Pupils are equal, round, and reactive to light.     Comments: No obvious nystagmus  Cardiovascular:     Rate and Rhythm: Normal rate and regular rhythm.     Pulses: Normal pulses.     Heart sounds: Normal heart sounds.  Pulmonary:  Effort: Pulmonary effort is normal.     Breath sounds: Normal breath sounds.  Abdominal:     General: Abdomen is flat.     Palpations: Abdomen is soft.  Musculoskeletal:        General: Normal range of motion.     Cervical back: Normal range of motion and neck supple.     Comments: Patient has left paralumbar tenderness.  Patient has positive straight leg raise on the left side.  Patient has no saddle anesthesia  Skin:    General: Skin is warm.     Capillary Refill: Capillary refill takes less than 2 seconds.  Neurological:     General: No focal deficit present.     Mental Status: She is alert.     Comments: Patient has no facial droop.  Normal sensation bilateral arms and legs.  Normal finger-to-nose bilaterally.  Patient does have normal strength bilateral arms.  Patient does have positive straight leg raise on the left side.  Patient  has normal reflexes bilateral knees.  Patient has normal strength bilateral lower extremities.  Patient is able to walk but does prefer the right side.  Patient has no obvious Romberg sign  Psychiatric:        Mood and Affect: Mood normal.        Behavior: Behavior normal.     ED Results / Procedures / Treatments   Labs (all labs ordered are listed, but only abnormal results are displayed) Labs Reviewed - No data to display  EKG None  Radiology CT Head Wo Contrast Result Date: 02/02/2024 CLINICAL DATA:  Head headache, increasing frequency or severity. Dizziness over the last 3 days. EXAM: CT HEAD WITHOUT CONTRAST TECHNIQUE: Contiguous axial images were obtained from the base of the skull through the vertex without intravenous contrast. RADIATION DOSE REDUCTION: This exam was performed according to the departmental dose-optimization program which includes automated exposure control, adjustment of the mA and/or kV according to patient size and/or use of iterative reconstruction technique. COMPARISON:  None Available. FINDINGS: Brain: The brain shows a normal appearance without evidence of malformation, atrophy, old or acute small or large vessel infarction, mass lesion, hemorrhage, hydrocephalus or extra-axial collection. Vascular: No hyperdense vessel. No evidence of atherosclerotic calcification. Skull: Normal.  No traumatic finding.  No focal bone lesion. Sinuses/Orbits: Sinuses are clear. Orbits appear normal. Mastoids are clear. Other: Middle ears and mastoid air cells are clear. IMPRESSION: Normal head CT. Electronically Signed   By: Bettylou Brunner M.D.   On: 02/02/2024 15:16    Procedures Procedures    Medications Ordered in ED Medications - No data to display  ED Course/ Medical Decision Making/ A&P                                 Medical Decision Making Carol Thomas is a 68 y.o. female here with dizziness and left leg gave out.  I reviewed records from earlier today.  Patient  has a negative Noncon CT head and labs.  Given recurrent symptoms, consider small cerebellar stroke versus sciatica.  Given that she had recurrent symptoms, I recommend that she gets MRI brain and MRI of the lumbar spine. Discussed with Dr. Aldean Amass at [facility], who will accept patient for transfer. Husband will drive her    Problems Addressed: Acute left-sided low back pain with left-sided sciatica: acute illness or injury Dizziness: acute illness or injury  Amount and/or Complexity of Data  Reviewed Radiology: ordered and independent interpretation performed. Decision-making details documented in ED Course.    Final Clinical Impression(s) / ED Diagnoses Final diagnoses:  None    Rx / DC Orders ED Discharge Orders     None         Dalene Duck, MD 02/02/24 1704

## 2024-02-03 ENCOUNTER — Inpatient Hospital Stay (HOSPITAL_COMMUNITY)

## 2024-02-03 DIAGNOSIS — I639 Cerebral infarction, unspecified: Secondary | ICD-10-CM | POA: Diagnosis present

## 2024-02-03 DIAGNOSIS — I6389 Other cerebral infarction: Secondary | ICD-10-CM | POA: Diagnosis not present

## 2024-02-03 DIAGNOSIS — F39 Unspecified mood [affective] disorder: Secondary | ICD-10-CM | POA: Insufficient documentation

## 2024-02-03 DIAGNOSIS — Z862 Personal history of diseases of the blood and blood-forming organs and certain disorders involving the immune mechanism: Secondary | ICD-10-CM

## 2024-02-03 DIAGNOSIS — D72819 Decreased white blood cell count, unspecified: Secondary | ICD-10-CM | POA: Insufficient documentation

## 2024-02-03 DIAGNOSIS — N183 Chronic kidney disease, stage 3 unspecified: Secondary | ICD-10-CM | POA: Insufficient documentation

## 2024-02-03 DIAGNOSIS — K200 Eosinophilic esophagitis: Secondary | ICD-10-CM | POA: Insufficient documentation

## 2024-02-03 DIAGNOSIS — Z8673 Personal history of transient ischemic attack (TIA), and cerebral infarction without residual deficits: Secondary | ICD-10-CM | POA: Diagnosis present

## 2024-02-03 LAB — URINALYSIS, ROUTINE W REFLEX MICROSCOPIC
Bacteria, UA: NONE SEEN
Bilirubin Urine: NEGATIVE
Glucose, UA: NEGATIVE mg/dL
Hgb urine dipstick: NEGATIVE
Ketones, ur: NEGATIVE mg/dL
Nitrite: NEGATIVE
Protein, ur: NEGATIVE mg/dL
Specific Gravity, Urine: 1.014 (ref 1.005–1.030)
pH: 5 (ref 5.0–8.0)

## 2024-02-03 LAB — DIFFERENTIAL
Abs Immature Granulocytes: 0.01 10*3/uL (ref 0.00–0.07)
Basophils Absolute: 0 10*3/uL (ref 0.0–0.1)
Basophils Relative: 1 %
Eosinophils Absolute: 0.2 10*3/uL (ref 0.0–0.5)
Eosinophils Relative: 4 %
Immature Granulocytes: 0 %
Lymphocytes Relative: 26 %
Lymphs Abs: 1.1 10*3/uL (ref 0.7–4.0)
Monocytes Absolute: 0.4 10*3/uL (ref 0.1–1.0)
Monocytes Relative: 9 %
Neutro Abs: 2.5 10*3/uL (ref 1.7–7.7)
Neutrophils Relative %: 60 %

## 2024-02-03 LAB — CBC
HCT: 35.9 % — ABNORMAL LOW (ref 36.0–46.0)
HCT: 37.4 % (ref 36.0–46.0)
Hemoglobin: 12.2 g/dL (ref 12.0–15.0)
Hemoglobin: 12.6 g/dL (ref 12.0–15.0)
MCH: 28.6 pg (ref 26.0–34.0)
MCH: 28.9 pg (ref 26.0–34.0)
MCHC: 33.7 g/dL (ref 30.0–36.0)
MCHC: 34 g/dL (ref 30.0–36.0)
MCV: 85 fL (ref 80.0–100.0)
MCV: 85.1 fL (ref 80.0–100.0)
Platelets: 102 10*3/uL — ABNORMAL LOW (ref 150–400)
Platelets: 104 10*3/uL — ABNORMAL LOW (ref 150–400)
RBC: 4.22 MIL/uL (ref 3.87–5.11)
RBC: 4.4 MIL/uL (ref 3.87–5.11)
RDW: 13.5 % (ref 11.5–15.5)
RDW: 13.6 % (ref 11.5–15.5)
WBC: 3.9 10*3/uL — ABNORMAL LOW (ref 4.0–10.5)
WBC: 4.2 10*3/uL (ref 4.0–10.5)
nRBC: 0 % (ref 0.0–0.2)
nRBC: 0 % (ref 0.0–0.2)

## 2024-02-03 LAB — LIPID PANEL
Cholesterol: 151 mg/dL (ref 0–200)
HDL: 63 mg/dL (ref >40)
LDL Cholesterol: 76 mg/dL (ref 0–99)
Total CHOL/HDL Ratio: 2.4 ratio
Triglycerides: 58 mg/dL (ref <150)
VLDL: 12 mg/dL (ref 0–40)

## 2024-02-03 LAB — COMPREHENSIVE METABOLIC PANEL WITH GFR
ALT: 25 U/L (ref 0–44)
ALT: 24 U/L (ref 0–44)
AST: 25 U/L (ref 15–41)
AST: 23 U/L (ref 15–41)
Albumin: 3.8 g/dL (ref 3.5–5.0)
Albumin: 3.7 g/dL (ref 3.5–5.0)
Alkaline Phosphatase: 63 U/L (ref 38–126)
Alkaline Phosphatase: 64 U/L (ref 38–126)
Anion gap: 11 (ref 5–15)
Anion gap: 11 (ref 5–15)
BUN: 14 mg/dL (ref 8–23)
BUN: 14 mg/dL (ref 8–23)
CO2: 25 mmol/L (ref 22–32)
CO2: 24 mmol/L (ref 22–32)
Calcium: 9.9 mg/dL (ref 8.9–10.3)
Calcium: 9.6 mg/dL (ref 8.9–10.3)
Chloride: 102 mmol/L (ref 98–111)
Chloride: 99 mmol/L (ref 98–111)
Creatinine, Ser: 1.13 mg/dL — ABNORMAL HIGH (ref 0.44–1.00)
Creatinine, Ser: 1.02 mg/dL — ABNORMAL HIGH (ref 0.44–1.00)
GFR, Estimated: 54 mL/min — ABNORMAL LOW (ref >60)
GFR, Estimated: >60 mL/min (ref >60)
Glucose, Bld: 106 mg/dL — ABNORMAL HIGH (ref 70–99)
Glucose, Bld: 104 mg/dL — ABNORMAL HIGH (ref 70–99)
Potassium: 4.2 mmol/L (ref 3.5–5.1)
Potassium: 3.5 mmol/L (ref 3.5–5.1)
Sodium: 138 mmol/L (ref 135–145)
Sodium: 134 mmol/L — ABNORMAL LOW (ref 135–145)
Total Bilirubin: 0.8 mg/dL (ref 0.0–1.2)
Total Bilirubin: 1 mg/dL (ref 0.0–1.2)
Total Protein: 6.7 g/dL (ref 6.5–8.1)
Total Protein: 6.4 g/dL — ABNORMAL LOW (ref 6.5–8.1)

## 2024-02-03 LAB — PROTIME-INR
INR: 1 (ref 0.8–1.2)
Prothrombin Time: 13.8 s (ref 11.4–15.2)

## 2024-02-03 LAB — ETHANOL: Alcohol, Ethyl (B): <10 mg/dL (ref <10)

## 2024-02-03 LAB — RAPID URINE DRUG SCREEN, HOSP PERFORMED
Amphetamines: NOT DETECTED
Barbiturates: NOT DETECTED
Benzodiazepines: NOT DETECTED
Cocaine: NOT DETECTED
Opiates: NOT DETECTED
Tetrahydrocannabinol: NOT DETECTED

## 2024-02-03 LAB — APTT: aPTT: 68 s — ABNORMAL HIGH (ref 24–36)

## 2024-02-03 LAB — HIV ANTIBODY (ROUTINE TESTING W REFLEX): HIV Screen 4th Generation wRfx: NONREACTIVE

## 2024-02-03 LAB — HEMOGLOBIN A1C
Hgb A1c MFr Bld: 5.2 % (ref 4.8–5.6)
Mean Plasma Glucose: 102.54 mg/dL

## 2024-02-03 LAB — TSH: TSH: 7.257 u[IU]/mL — ABNORMAL HIGH (ref 0.350–4.500)

## 2024-02-03 MED ORDER — PANTOPRAZOLE SODIUM 40 MG PO TBEC
40.0000 mg | DELAYED_RELEASE_TABLET | Freq: Two times a day (BID) | ORAL | Status: DC
  Administered 2024-02-03 – 2024-02-04 (×3): 40 mg via ORAL
  Filled 2024-02-03 (×3): qty 1

## 2024-02-03 MED ORDER — SODIUM CHLORIDE 0.9% FLUSH: 3.0000 mL | INTRAVENOUS | Status: DC | PRN

## 2024-02-03 MED ORDER — ACETAMINOPHEN 160 MG/5ML PO SOLN: 650.0000 mg | ORAL | Status: DC | PRN

## 2024-02-03 MED ORDER — CITALOPRAM HYDROBROMIDE 10 MG PO TABS
20.0000 mg | ORAL_TABLET | Freq: Every day | ORAL | Status: DC
  Administered 2024-02-03 – 2024-02-04 (×2): 20 mg via ORAL
  Filled 2024-02-03 (×2): qty 2

## 2024-02-03 MED ORDER — CLOPIDOGREL BISULFATE 75 MG PO TABS
75.0000 mg | ORAL_TABLET | Freq: Every day | ORAL | Status: DC
  Administered 2024-02-03: 75 mg via ORAL
  Filled 2024-02-03: qty 1

## 2024-02-03 MED ORDER — VITAMIN D 25 MCG (1000 UNIT) PO TABS
2000.0000 [IU] | ORAL_TABLET | Freq: Every morning | ORAL | Status: DC
  Administered 2024-02-03 – 2024-02-04 (×2): 2000 [IU] via ORAL
  Filled 2024-02-03 (×2): qty 2

## 2024-02-03 MED ORDER — ACETAMINOPHEN 650 MG RE SUPP: 650.0000 mg | RECTAL | Status: DC | PRN

## 2024-02-03 MED ORDER — PROPYLENE GLYCOL (PF) 0.6 % OP SOLN: 1.0000 [drp] | Freq: Three times a day (TID) | OPHTHALMIC | Status: DC | PRN

## 2024-02-03 MED ORDER — SODIUM CHLORIDE 0.9% FLUSH
3.0000 mL | Freq: Two times a day (BID) | INTRAVENOUS | Status: DC
  Administered 2024-02-03: 5 mL via INTRAVENOUS
  Administered 2024-02-03: 10 mL via INTRAVENOUS
  Administered 2024-02-03 – 2024-02-04 (×2): 3 mL via INTRAVENOUS

## 2024-02-03 MED ORDER — ACETAMINOPHEN 325 MG PO TABS: 650.0000 mg | ORAL_TABLET | ORAL | Status: DC | PRN

## 2024-02-03 MED ORDER — ASPIRIN 81 MG PO TBEC
81.0000 mg | DELAYED_RELEASE_TABLET | Freq: Every day | ORAL | Status: DC
  Administered 2024-02-03 – 2024-02-04 (×2): 81 mg via ORAL
  Filled 2024-02-03 (×2): qty 1

## 2024-02-03 MED ORDER — STROKE: EARLY STAGES OF RECOVERY BOOK
Freq: Once | Status: AC
  Filled 2024-02-03: qty 1

## 2024-02-03 MED ORDER — ROSUVASTATIN CALCIUM 20 MG PO TABS
20.0000 mg | ORAL_TABLET | Freq: Every day | ORAL | Status: DC
  Administered 2024-02-03 – 2024-02-04 (×2): 20 mg via ORAL
  Filled 2024-02-03 (×2): qty 1

## 2024-02-03 MED ORDER — ASPIRIN 325 MG PO TBEC
325.0000 mg | DELAYED_RELEASE_TABLET | Freq: Once | ORAL | Status: AC
  Administered 2024-02-03: 325 mg via ORAL
  Filled 2024-02-03: qty 1

## 2024-02-03 NOTE — H&P (Addendum)
 History and Physical    Patient: Carol Thomas GNF:[id number] DOB: 1956-01-10 DOA: 02/02/2024 DOS: the patient was seen and examined on 02/03/2024 PCP: Roosvelt Colla, MD  Patient coming from: [facility] by private car  Chief Complaint:  Chief Complaint  Patient presents with   Extremity Weakness   Back Pain   HPI: 68 year old female with a past medical history of eosinophilic esophagitis hypertension ITP who presents via private car from the [facility] emergency department.  The patient initially presented with multiple episodes of vertigo and "left lower extremity giving out".  The patient denies any loss of bladder or bowel control denies any headache denies any numbness or tingling.  She went to a diner immediately adjacent to the [facility] ED after receiving an unremarkable CT head and subsequently had recurrent symptoms prompting her presentation to [facility] for MRI of the brain and lumbar spine. She has intermittent lower back pain at baseline, unchanged.  The patient denies any loss of consciousness or previously having similar symptoms denies any dysarthria dysphagia or visual disturbances. Case discussed with the emergency department provider Twelve-lead ECG independently reviewed and interpreted sinus rhythm ventricular rate of 62 QTc 486 PR 174, no acute ischemic ST changes  Past medical records reviewed and summarized The patient's oncology note from November 08, 2023: Completed course of rituximab  in October and maintained off prednisone  since November, platelet count had normalized and Patient's primary care visit from 08/26/2024: Hypertension, anxiety, thrombocytopenia, hyperlipidemia  Review of Systems:   Constitutional: Denies Weight Loss, Fever, Chills or Night Sweats Eyes: Denies Blurry Vision, Eye Pain or Decreased Vision ENT: Denies Sore Throat , Tinnitus, Hearing Loss Respiratory: Denies Shortness of Breath, Cough, Hemoptysis, Wheezing,  Pleurisy Cardiovascular: Denies Chest Pain, Paroxsymal Nocturnal Dyspnea, Palpitations, Edema Gastrointestinal: Denies Nausea, Vomiting, Diarrhea, Hematemesis, Melena Genitourinary: Denies hematuria, dysuria, hesitancy, incontinence Hem/Lymphatic: Denies Easy Bruising, Blood Clots Allergy/Immun: Denies history of Hay Fever, Positive PPD or Hives All other systems were reviewed and are negative  Past Medical History:  Diagnosis Date   Adenomatous colon polyp 11/16/2008   Dr. Elsie Halo at [facility]   Anxiety    Cancer [facility]) 10/16/1996   Melanoma-Left leg   Hypertension    Idiopathic thrombocytopenic purpura (ITP) (HCC)    Osteopenia 10/16/2012   T score of -2.2 FRAX 8%/1.1%   Vitamin D  deficiency 10/16/2012   vitamin D  level 10   Past Surgical History:  Procedure Laterality Date   APPENDECTOMY     ESOPHAGOGASTRODUODENOSCOPY  05/24/2017   Erythema in the middle third of the esphagus and in lower third.-biopised erythematous mucosa in the antrum, normal examined duodenum   Melanoma excised     TOTAL VAGINAL HYSTERECTOMY  1997   TAH   TUBAL LIGATION     UPPER GI ENDOSCOPY  04/03/2022   Dr. Tomas Fountain gastritis, possible eosinophilic esophagitis   Social History:  reports that she has never smoked. She has never used smokeless tobacco. She reports that she does not drink alcohol and does not use drugs.  Allergies  Allergen Reactions   Other Other (See Comments)    Patient has Eosinophilic Esophagitis = Heartburn/acid reflux Eosinophilic esophagitis (EoE) is an inflammation of the esophagus (the tube connecting the mouth to the stomach), caused by a specific white blood cell - the eosinophil. NO eggs, juices, hot dogs, milk, cucumbers, etc...Aaron AasAaron AasAaron Aas    Family History  Problem Relation Age of Onset   Hypertension Mother    Diabetes Mother    Osteoporosis Mother  Thyroid disease Mother    COPD Mother    Hyperlipidemia Mother    Heart attack Mother 80   Anxiety disorder Mother     Arthritis Mother    Heart disease Mother    Kidney disease Mother    Stroke Daughter        during pregnancy   Depression Daughter    Bipolar disorder Son    Hyperlipidemia Sister    Colon cancer Maternal Uncle        40's    Prior to Admission medications   Medication Sig Start Date End Date Taking? Authorizing Provider  alendronate  (FOSAMAX ) 70 MG tablet Take 1 tablet (70 mg total) by mouth every 7 (seven) days. Take with a full glass of water on an empty stomach. 01/31/24   Roosvelt Colla, MD  amLODipine  (NORVASC ) 10 MG tablet Take 1 tablet (10 mg total) by mouth daily. 08/27/23   Roosvelt Colla, MD  Cholecalciferol  (VITAMIN D3) 50 MCG (2000 UT) TABS Take 2,000 Units by mouth in the morning.    [provider]  citalopram  (CELEXA ) 20 MG tablet Take 1 tablet (20 mg total) by mouth daily. 08/27/23   Roosvelt Colla, MD  co-enzyme Q-10 30 MG capsule Take 30 mg by mouth daily.    [provider]  losartan -hydrochlorothiazide  (HYZAAR) 100-25 MG tablet Take 1 tablet by mouth daily. 08/27/23   Roosvelt Colla, MD  pantoprazole  (PROTONIX ) 40 MG tablet Take 40 mg by mouth 2 (two) times daily. 06/13/17   [provider]  rosuvastatin  (CRESTOR ) 10 MG tablet Take 1 tablet (10 mg total) by mouth daily. 08/27/23   Roosvelt Colla, MD  SYSTANE COMPLETE PF 0.6 % SOLN Place 1 drop into both eyes 3 (three) times daily as needed (for dryness or irritation).    [provider]    Physical Exam: Vitals:   02/02/24 1650 02/02/24 1651 02/02/24 2039 02/03/24 0050  BP:  (!) 157/72 127/86 (!) 150/85  Pulse:  77 72 60  Resp:  20 18 18   Temp:  98.2 F (36.8 C) 98.3 F (36.8 C)   TempSrc:  Oral    SpO2:  98% 99% 100%  Weight: 90.4 kg     Height: 5\' 6"  (1.676 m)     Patient seen and examined room 22 over the emergency department at 1:13 AM low with the patient's husband at the bedside Constitutional:  Vital Signs as per Above Circles Of Care than three noted] No Acute Distress Eyes:  Pink Conjunctiva  and no Ptosis ENMT:   External Appearance of Ears and Nose without obvious deformity, masses or scar       Neck:     Trachea Midline, Neck Symmetric             Thyroid without tenderness, palpable masses or nodules Respiratory:   Respiratory Effort Normal: No Use of Respiratory Muscles,No  Intercostal Retractions             Lungs Clear to Auscultation Bilaterally Cardiovascular:   Heart Auscultated: Regular Regular without any added sounds or murmurs              No Lower Extremity Edema Gastrointestinal:  Abdomen soft and nontender without palpable masses, guarding or rebound  No Palpable Splenomegaly or Hepatomegaly Lymphatic:  No Palpable Cervical Lymphadenopathy or Palpable No Axillary Lymphadenopathy Neurologic:  Face Symmetric, Sensation Intact B/L EOM Intact no nystagmus Cannot fully cooperate with cerebellar intention tremor past point, inconclusive (known acute lesion) RUE and RLE as well  as LUE and LLE 5/5 Power Sensation Intact B/L UE and LE Psychiatric:  Patient Orientated to Time, Place and Person Patient with appropriate mood and affect Recent and Remote Memory Intact  Data Reviewed: Labs, Radiology, ECG as detailed in HPI and A/P   Assessment and Plan: * Stroke (HCC)   Acute ischemic cerebellar and left parietal occipital infarctions Last known well 10:30 AM CT of the head is within normal limits, presenting as multiple episodes of left lower extremity "giving out" MRI of the brain with 2 probable infarctions: Small acute left cerebellar infarct and subacute small infarct of the left parietal occipital region, MRI lumbar spine without any acute findings Plan: Echocardiogram, MRA to image vessels to exclude LVO, rosuvastatin  20 mg and lipid panel, A1c, continue aspirin  and Plavix  both of which have been started by the neurology consultant, neurology consultation, TSH, telemetry, PT, OT, SLT   Essential hypertension  Hypertension Systolic blood pressure  150 currently Given the presence of acute stroke we will allow for autoregulation Given non-tPA candidate we will add PRNs if SBP approaches 220 Holding patient's home amlodipine  10 mg and losartan  hydrochlorothiazide    History of ITP ITP Platelets 109 today As per ASH guidelines platelet count is greater than 30 and we will hold off any steroids Continue outpatient follow-up with hematologist, continue daily review in light risk-benefit of DAPT Avoiding heparin products or any form of DVT prophylaxis given the dual antiplatelets and thrombocytopenia  Eosinophilic esophagitis Eosinophilic esophagitis Continuing the patient's PPI twice daily   Mood disorder (HCC) Mood disorder Continuing home Celexa  20 mg daily   Leukopenia Leukopenia WBC 3.7 Likely not acutely clinically significant to todays presentation Continue outpatient follow-up with hematology   CKD (chronic kidney disease) stage 3, GFR 30-59 ml/min (HCC) Chronic kidney disease G3A probable Baseline creatinine 1.1 to 1.01 today Will avoid nephrotoxins and follow Needs close interval outpatient follow-up   Osteopenia   Osteopenia Continue vitamin D  patient on bisphosphonate as outpatient   HLD (hyperlipidemia)  Hyperlipidemia Increasing the patient's home rosuvastatin  from 10 mg to 20 mg for high intensity statin    Advance Care Planning:   Code Status: Full Code as per patient   Consults: Neurology (D/W Dr. Lindzen via telephone 1:59 AM)  Family Communication: Husband at bedside   Severity of Illness: The appropriate patient status for this patient is INPATIENT. Inpatient status is judged to be reasonable and necessary in order to provide the required intensity of service to ensure the patient's safety. The patient's presenting symptoms, physical exam findings, and initial radiographic and laboratory data in the context of their chronic comorbidities is felt to place them at high risk for further  clinical deterioration. Furthermore, it is not anticipated that the patient will be medically stable for discharge from the hospital within 2 midnights of admission.   * I certify that at the point of admission it is my clinical judgment that the patient will require inpatient hospital care spanning beyond 2 midnights from the point of admission due to high intensity of service, high risk for further deterioration and high frequency of surveillance required.*  Author: Russel Courser, MD 02/03/2024 1:56 AM  For on call review [email or web address].

## 2024-02-03 NOTE — Assessment & Plan Note (Signed)
 Chronic kidney disease G3A probable Baseline creatinine 1.1 to 1.01 today Will avoid nephrotoxins and follow Needs close interval outpatient follow-up

## 2024-02-03 NOTE — Evaluation (Signed)
 Physical Therapy Evaluation Patient Details Name: Carol Thomas MRN: [id number] DOB: [birth year] Today's Date: 02/03/2024  History of Present Illness  68 y.o. female presents to [facility] 02/03/24 from [facility] ED with complaints of worsening dizziness x3 days and LE weakness. Brain MRI with 2 probable infarcts of small acute L cerebellar infarct and subacute small infarct of L parietal occipital region. PMHx: HTN, ITP, osteopenia, anxiety, CKD stage 3   Clinical Impression  Pt in bed upon arrival with husband present and agreeable to PT eval. PTA, pt was independent for all mobility and ADLs with no AD. In today's session, pt was independent for all mobility and scored 24/24 on the DGI. Pt reports being at functional mobility baseline. Pt has no further acute or post acute PT needs. Pt feels comfortable returning home with 24/7 assist available from husband. Acute PT signing off. Please re-consult if new needs arise.  Supine BP- 146/85, 65 BPM Supine BP after mobility- 151/79, 64 BPM        If plan is discharge home, recommend the following: Help with stairs or ramp for entrance;Assist for transportation   Can travel by private vehicle   Yes     Equipment Recommendations None recommended by PT     Functional Status Assessment Patient has had a recent decline in their functional status and demonstrates the ability to make significant improvements in function in a reasonable and predictable amount of time.     Precautions / Restrictions Precautions Precautions: None Restrictions Weight Bearing Restrictions Per Provider Order: No      Mobility  Bed Mobility Overal bed mobility: Independent     Transfers Overall transfer level: Independent     Ambulation/Gait Ambulation/Gait assistance: Independent Gait Distance (Feet): 200 Feet Assistive device: None Gait Pattern/deviations: WFL(Within Functional Limits)          Balance  Standardized Balance Assessment Standardized  Balance Assessment : Dynamic Gait Index   Dynamic Gait Index Level Surface: Normal Change in Gait Speed: Normal Gait with Horizontal Head Turns: Normal Gait with Vertical Head Turns: Normal Gait and Pivot Turn: Normal Step Over Obstacle: Normal Step Around Obstacles: Normal Steps: Normal Total Score: 24       Pertinent Vitals/Pain Pain Assessment Pain Assessment: No/denies pain    Home Living Family/patient expects to be discharged to:: Private residence Living Arrangements: Spouse/significant other;Other relatives (68 and 32 y.o. grandsons) Available Help at Discharge: Family;Available PRN/intermittently (husband) Type of Home: House Home Access: Level entry       Home Layout: One level Home Equipment: Grab bars - tub/shower;BSC/3in1;Rolling Walker (2 wheels);Cane - single point      Prior Function Prior Level of Function : Independent/Modified Independent;Driving    Mobility Comments: Ind with no AD ADLs Comments: Ind     Extremity/Trunk Assessment   Upper Extremity Assessment Upper Extremity Assessment: Defer to OT evaluation    Lower Extremity Assessment Lower Extremity Assessment: Overall WFL for tasks assessed    Cervical / Trunk Assessment Cervical / Trunk Assessment: Normal  Communication   Communication Communication: No apparent difficulties    Cognition Arousal: Alert Behavior During Therapy: WFL for tasks assessed/performed   PT - Cognitive impairments: No apparent impairments    Following commands: Intact       Cueing Cueing Techniques: Verbal cues      PT Assessment Patient does not need any further PT services   AM-PAC PT "6 Clicks" Mobility  Outcome Measure Help needed turning from your back to your side while in  a flat bed without using bedrails?: None Help needed moving from lying on your back to sitting on the side of a flat bed without using bedrails?: None Help needed moving to and from a bed to a chair (including a  wheelchair)?: None Help needed standing up from a chair using your arms (e.g., wheelchair or bedside chair)?: None Help needed to walk in hospital room?: None Help needed climbing 3-5 steps with a railing? : A Little 6 Click Score: 23    End of Session Equipment Utilized During Treatment: Gait belt Activity Tolerance: Patient tolerated treatment well Patient left: in bed;with call bell/phone within reach;with nursing/sitter in room Nurse Communication: Mobility status PT Visit Diagnosis: Other abnormalities of gait and mobility (R26.89)    Time: 4098-1191 PT Time Calculation (min) (ACUTE ONLY): 21 min   Charges:   PT Evaluation $PT Eval Low Complexity: 1 Low   PT General Charges $$ ACUTE PT VISIT: 1 Visit        Carol Thomas, PT, DPT Secure Chat Preferred  Rehab Office [phone]   Carol Thomas 02/03/2024, 11:21 AM

## 2024-02-03 NOTE — Progress Notes (Addendum)
 STROKE TEAM PROGRESS NOTE   INTERIM HISTORY/SUBJECTIVE  Family is at the bedside. Patient is awake and alert in NAD. Patient states she had 2 different episodes of LLE weakness yesterday and have since resolved.  MRI shows a subacute left parietal white matter as well as acute left cerebellar infarcts.  She has no prior history of strokes TIAs or seizures.  She has history of ITP but presently is not on any medications and platelet count is 73,000.  Will only recommend a 81mg  ASA alone due to her ITP  Recommend 30 day heart monitor as outpatient    OBJECTIVE  CBC    Component Value Date/Time   WBC 3.9 (L) 02/03/2024 0400   RBC 4.22 02/03/2024 0400   HGB 12.2 02/03/2024 0400   HGB 13.5 11/08/2023 0940   HGB 13.3 08/11/2022 1002   HCT 35.9 (L) 02/03/2024 0400   HCT 38.3 08/11/2022 1002   PLT 104 (L) 02/03/2024 0400   PLT 163 11/08/2023 0940   PLT 73 (LL) 08/11/2022 1002   MCV 85.1 02/03/2024 0400   MCV 84 08/11/2022 1002   MCH 28.9 02/03/2024 0400   MCHC 34.0 02/03/2024 0400   RDW 13.6 02/03/2024 0400   RDW 12.4 08/11/2022 1002   LYMPHSABS 1.1 02/02/2024 0013   LYMPHSABS 0.9 08/11/2022 1002   MONOABS 0.4 02/02/2024 0013   EOSABS 0.2 02/02/2024 0013   EOSABS 0.1 08/11/2022 1002   BASOSABS 0.0 02/02/2024 0013   BASOSABS 0.0 08/11/2022 1002    BMET    Component Value Date/Time   NA 134 (L) 02/03/2024 0400   NA 139 02/13/2023 0943   K 3.5 02/03/2024 0400   CL 99 02/03/2024 0400   CO2 24 02/03/2024 0400   GLUCOSE 104 (H) 02/03/2024 0400   BUN 14 02/03/2024 0400   BUN 12 02/13/2023 0943   CREATININE 1.02 (H) 02/03/2024 0400   CREATININE 1.12 (H) 07/04/2023 0950   CREATININE 1.06 (H) 10/22/2017 0823   CALCIUM  9.6 02/03/2024 0400   EGFR 55 (L) 02/13/2023 0943   GFRNONAA >60 02/03/2024 0400   GFRNONAA 54 (L) 07/04/2023 0950    IMAGING past 24 hours MR ANGIO HEAD WO CONTRAST Result Date: 02/03/2024 CLINICAL DATA:  68 year old female with neurologic deficit and small  acute to subacute infarcts in the left cerebellar and PCA territories on MRI yesterday. EXAM: MRA HEAD WITHOUT CONTRAST TECHNIQUE: Angiographic images of the Circle of Willis were acquired using MRA technique without intravenous contrast. COMPARISON:  Brain MRI yesterday and neck MRA today reported separately. FINDINGS: Anterior circulation: Antegrade flow in both ICA siphons. Supraclinoid irregularity which is more pronounced on the left. Mild left supraclinoid stenosis. Ophthalmic artery origins appear normal. Patent carotid termini. Normal MCA and ACA origins. Normal anterior communicating artery and visible ACA branches. Visible bilateral MCA branches are patent and within normal limits. Posterior circulation: Antegrade flow in the posterior circulation. Normal distal vertebral arteries and vertebrobasilar junction. Right V4 mildly dominant as on the comparison neck. Both PICA origins are patent. Patent basilar artery without stenosis. AICA, SCA and PCA origins appear patent. Posterior communicating arteries are diminutive or absent. Mild right PCA P1/P2 segment junction irregularity without stenosis. Tortuous left P1 segment and mild to moderate left P1/P2 segment junction irregularity and stenosis on series 1021, image 4. Maintained distal PCA flow. Anatomic variants: Mildly dominant right vertebral artery. Other: No evidence of intracranial mass effect or ventriculomegaly. IMPRESSION: No large vessel occlusion. Evidence of intracranial atherosclerosis with up to Moderate Left PCA P1/P2  junction stenosis. Mild supraclinoid ICA siphon stenosis on the Left. Electronically Signed   By: Marlise Simpers M.D.   On: 02/03/2024 06:24   MR ANGIO NECK WO CONTRAST Result Date: 02/03/2024 CLINICAL DATA:  68 year old female with neurologic deficit and small acute to subacute infarcts in the left cerebellar and PCA territories on MRI yesterday. EXAM: MRA NECK WITHOUT CONTRAST TECHNIQUE: Angiographic images of the neck were  acquired using MRA technique without intravenous contrast. Carotid stenosis measurements (when applicable) are obtained utilizing NASCET criteria, using the distal internal carotid diameter as the denominator. COMPARISON:  Brain MRI yesterday. FINDINGS: Time-of-flight neck MRA images demonstrate antegrade flow in the bilateral cervical carotid and vertebral arteries. The right vertebral artery appears mildly dominant. No evidence of V2 or V3 stenosis. Both carotid bifurcations are patent, within normal limits. No evidence of cervical ICA stenosis. Intracranial circulation detailed separately. Other: None. IMPRESSION: Normal noncontrast Neck MRA. Electronically Signed   By: Marlise Simpers M.D.   On: 02/03/2024 06:21   MR LUMBAR SPINE WO CONTRAST Result Date: 02/02/2024 CLINICAL DATA:  Low back pain, cauda equina syndrome suspected EXAM: MRI LUMBAR SPINE WITHOUT CONTRAST TECHNIQUE: Multiplanar, multisequence MR imaging of the lumbar spine was performed. No intravenous contrast was administered. COMPARISON:  None Available. FINDINGS: Segmentation: Standard. Alignment:  No substantial sagittal subluxation. Vertebrae: Heterogeneous marrow. No acute fracture, evidence of discitis, or bone lesion. Conus medullaris and cauda equina: Conus extends to the L1-L2 level. Conus and cauda equina appear normal. Paraspinal and other soft tissues: Unremarkable. Disc levels: T12-L1: No significant disc protrusion, foraminal stenosis, or canal stenosis. L1-L2: No significant disc protrusion, foraminal stenosis, or canal stenosis. L2-L3: Mild disc bulging.  No significant stenosis. L3-L4: Mild disc bulging. Ligamentum flavum thickening. No significant stenosis. L4-L5: Mild disc bulging. Facet arthropathy and small joint effusions. No significant stenosis. L5-S1: Facet arthropathy.  No significant stenosis. IMPRESSION: No evidence of acute abnormality or significant stenosis. Electronically Signed   By: Stevenson Elbe M.D.   On:  02/02/2024 20:07   MR BRAIN WO CONTRAST Result Date: 02/02/2024 CLINICAL DATA:  Neuro deficit, acute, stroke suspected EXAM: MRI HEAD WITHOUT CONTRAST TECHNIQUE: Multiplanar, multiecho pulse sequences of the brain and surrounding structures were obtained without intravenous contrast. COMPARISON:  CT head from today. FINDINGS: Brain: Small acute left cerebellar infarct. Probably subacute small infarct in the left parieto-occipital region. Mild edema without mass effect. No evidence of acute hemorrhage, mass lesion, midline shift or hydrocephalus. Vascular: Major arterial flow voids are maintained at the skull base. Skull and upper cervical spine: Normal marrow signal. Sinuses/Orbits: Clear sinuses.  No acute orbital findings. Other: No mastoid effusions. IMPRESSION: 1. Small acute left cerebellar infarct. 2. Probably subacute small infarct in the left parieto-occipital region. Electronically Signed   By: Stevenson Elbe M.D.   On: 02/02/2024 19:43   CT Head Wo Contrast Result Date: 02/02/2024 CLINICAL DATA:  Head headache, increasing frequency or severity. Dizziness over the last 3 days. EXAM: CT HEAD WITHOUT CONTRAST TECHNIQUE: Contiguous axial images were obtained from the base of the skull through the vertex without intravenous contrast. RADIATION DOSE REDUCTION: This exam was performed according to the departmental dose-optimization program which includes automated exposure control, adjustment of the mA and/or kV according to patient size and/or use of iterative reconstruction technique. COMPARISON:  None Available. FINDINGS: Brain: The brain shows a normal appearance without evidence of malformation, atrophy, old or acute small or large vessel infarction, mass lesion, hemorrhage, hydrocephalus or extra-axial collection. Vascular: No hyperdense vessel.  No evidence of atherosclerotic calcification. Skull: Normal.  No traumatic finding.  No focal bone lesion. Sinuses/Orbits: Sinuses are clear. Orbits appear  normal. Mastoids are clear. Other: Middle ears and mastoid air cells are clear. IMPRESSION: Normal head CT. Electronically Signed   By: Bettylou Brunner M.D.   On: 02/02/2024 15:16    Vitals:   02/03/24 0900 02/03/24 0930 02/03/24 1000 02/03/24 1100  BP: (!) 150/83 (!) 158/82 (!) 142/81 (!) 146/85  Pulse: 60 63 65 67  Resp: 15 15 15    Temp:      TempSrc:      SpO2: 97% 100% 100% 100%  Weight:      Height:         PHYSICAL EXAM General:  Alert, well-nourished, well-developed pleasant middle-age Caucasian lady in no acute distress Psych:  Mood and affect appropriate for situation CV: Regular rate and rhythm on monitor Respiratory:  Regular, unlabored respirations on room air GI: Abdomen soft and nontender   NEURO:  Mental Status: AA&Ox3, patient is able to give clear and coherent history Speech/Language: speech is without dysarthria or aphasia.  Naming, repetition, fluency, and comprehension intact.  Cranial Nerves:  II: PERRL. Visual fields full.  III, IV, VI: EOMI. Eyelids elevate symmetrically.  V: Sensation is intact to light touch and symmetrical to face.  VII: Face is symmetrical resting and smiling VIII: hearing intact to voice. IX, X: Palate elevates symmetrically. Phonation is normal.  UU:[id number] shrug 5/5. XII: tongue is midline without fasciculations. Motor: 5/5 strength to all muscle groups tested.  Tone: is normal and bulk is normal Sensation- Intact to light touch bilaterally. Extinction absent to light touch to DSS.   Coordination: FTN intact bilaterally, HKS: no ataxia in BLE.No drift.  Gait- deferred  Most Recent NIH 0    ASSESSMENT/PLAN  [name] is a 68 y.o. female with history of HTN, ITP, adenomatous colon polyp (2010), anxiety, melanoma to left leg (1998), eosinophilic esophagitis, osteopenia and vitamin D  deficiency who was initially seen earlier on Saturday for LLE weakness after her leg gave out during an episode of vertigo. NIH on  Admission 0  Acute Ischemic Infarct: Acute left cerebellar infarct and subacute left parietal white matter infarct Etiology:  Small vessel disease  Code Stroke CT head No acute abnormality. ASPECTS 10.    MRI  Small acute left cerebellar infarct. Probably subacute small infarct in the left parieto-occipital region. MRA  Moderate Left PCA P1/P2 junction stenosis  2D Echo ordered Recommend 30 day heart monitor after discharge  LDL 76 HgbA1c 5.2 VTE prophylaxis - SCDs No antithrombotic prior to admission, now on aspirin  81 mg daily due to ITP  Therapy recommendations:  No follow up needed  Disposition:  Pending completion of echo  Hypertension Home meds:  Norvasc  10mg , losartan -hydrochorothiazide Stable Blood Pressure Goal: BP less than 220/110   Hyperlipidemia Home meds:  Crestor  10mg   LDL 76, goal < 70 Increase crestor  to 20mg  Continue statin at discharge  Other Stroke Risk Factors Obesity, Body mass index is 32.17 kg/m., BMI >/= 30 associated with increased stroke risk, recommend weight loss, diet and exercise as appropriate   Other Active Problems ITP No plavix  Follows hematology  Hospital day # 0  Jonette Nestle DNP, ACNPC-AG  [facility]  I have personally obtained history,examined this patient, reviewed notes, independently viewed imaging studies, participated in medical decision making and plan of care.ROS completed by me personally and pertinent positives fully documented  I have made any additions  or clarifications directly to the above note. Agree with note above.  Patient has presented with 2 transient episodes of left leg weakness with MRI scan showing small left cerebellar and left parietal white matter infarcts which likely cannot explain leg weakness and a silent infarcts.  The TTP with the low platelet count presents treatment challenges and is recommend only aspirin  81 mg daily but she will need close monitoring of her platelet count and we need to  hold the aspirin  if counts below 50,000.  She will also need 30-day heart monitor at discharge to look for paroxysmal A-fib.  Continue ongoing stroke workup.  Long discussion with patient and husband and answered questions.  Discussed with Dr.Adhikari.  Greater than 50% time during this 50-minute visit was spent in counseling and coordination of care about TIA and small silent strokes and ITP and answering questions  Ardella Beaver, MD Medical Director [facility] Pager: [phone] 02/03/2024 2:52 PM   To contact Stroke Continuity provider, please refer to [email or web address]. After hours, contact General Neurology

## 2024-02-03 NOTE — Progress Notes (Signed)
 Brief same day note:   Patient is a 68 year old female with history of eosinophilic esophagitis, hypertension, ITP who presented with complaint of left lower extremity weakness, back pain.  CT head was not remarkable.  Sent to [facility] ED for MRI of the brain and lumbar spine.  No loss of consciousness, dysarthria or dysphagia or visual disturbances.  MRI of the brain showed small acute left cerebellar infarct, subacute small infarct of the left occipital region.  MRI of the lumbar spine did not show any acute findings.  Admitted for stroke workup.  Neurology following.  Patient seen and examined at bedside today.  Hemodynamically stable.  She was comfortably lying in bed.  Speech clear.  Denies any back pain or focal weakness  Assessment and plan:  Acute ischemic stroke: Presented with left lower extremity weakness.  MRI of the brain showed   MRI of the brain showed small acute left cerebellar infarct, subacute small infarct of the left occipital region.  Neurology consulted and following.  Initiated stroke workup.  Follow-up echocardiogram.  PT/OT/speech consulted. A1c 5.2.  LDL of 76.  Already on Crestor .  Neurology recommending 30-day event monitor on discharge  Hypertension: Allow permissive hypertension for now.  Amlodipine , losartan , hydrochlorothiazide  at home.  Continue current medications for severe hypertension  History of ITP: Has mild thrombocytopenia.  Follows with hematology.  Not on Lovenox or subcu heparin for now  History of eosinophilic esophagitis: Continue PPI twice daily  Mood disorder: On Celexa   Leukopenia: Follows with hematology as an outpatient  CKD stage IIIa: Currently kidney function is stable  History of osteopenia: On vitamin D  supplementation  Hyperlipidemia: On Crestor 

## 2024-02-03 NOTE — Assessment & Plan Note (Signed)
 Mood disorder Continuing home Celexa  20 mg daily

## 2024-02-03 NOTE — Assessment & Plan Note (Signed)
 Leukopenia WBC 3.7 Likely not acutely clinically significant to todays presentation Continue outpatient follow-up with hematology

## 2024-02-03 NOTE — ED Notes (Signed)
 CT

## 2024-02-03 NOTE — Plan of Care (Signed)
  Problem: Pain Managment: Goal: General experience of comfort will improve and/or be controlled Outcome: Progressing   Problem: Safety: Goal: Ability to remain free from injury will improve Outcome: Progressing   Problem: Skin Integrity: Goal: Risk for impaired skin integrity will decrease Outcome: Progressing

## 2024-02-03 NOTE — Consult Note (Addendum)
 NEUROLOGY CONSULT NOTE   Date of service: February 03, 2024 Patient Name: Carol Thomas MRN:  [id number] DOB:  Sep 01, 1956 Chief Complaint: LLE weakness Requesting Provider: Eve Hinders, MD  History of Present Illness  Carol Thomas is a 68 y.o. female with a PMHx of HTN, ITP, adenomatous colon polyp (2010), anxiety, melanoma to left leg (1998), eosinophilic esophagitis, osteopenia and vitamin D  deficiency who was initially seen earlier on Saturday for LLE weakness after her leg gave out during an episode of vertigo.She had an unremarkable workup including CT head and labs. She was discharged home and then returned to the ED on Saturday night with recurrence of LLE weakness while at a restaurant - she had tried to get up and her leg gave out again.   MRI was obtained, which revealed a punctate acute left cerebellar infarct in addition to a subacute small cortical-subcortical infarct in the left parieto-occipital region.    ROS  Comprehensive ROS performed and pertinent positives documented in HPI    Past History   Past Medical History:  Diagnosis Date   Adenomatous colon polyp 11/16/2008   Dr. Elsie Halo at [facility]   Anxiety    Cancer [facility]) 10/16/1996   Melanoma-Left leg   Hypertension    Idiopathic thrombocytopenic purpura (ITP) (HCC)    Osteopenia 10/16/2012   T score of -2.2 FRAX 8%/1.1%   Vitamin D  deficiency 10/16/2012   vitamin D  level 10    Past Surgical History:  Procedure Laterality Date   APPENDECTOMY     ESOPHAGOGASTRODUODENOSCOPY  05/24/2017   Erythema in the middle third of the esphagus and in lower third.-biopised erythematous mucosa in the antrum, normal examined duodenum   Melanoma excised     TOTAL VAGINAL HYSTERECTOMY  1997   TAH   TUBAL LIGATION     UPPER GI ENDOSCOPY  04/03/2022   Dr. Tomas Fountain gastritis, possible eosinophilic esophagitis    Family History: Family History  Problem Relation Age of Onset   Hypertension Mother    Diabetes Mother     Osteoporosis Mother    Thyroid  disease Mother    COPD Mother    Hyperlipidemia Mother    Heart attack Mother 55   Anxiety disorder Mother    Arthritis Mother    Heart disease Mother    Kidney disease Mother    Stroke Daughter        during pregnancy   Depression Daughter    Bipolar disorder Son    Hyperlipidemia Sister    Colon cancer Maternal Uncle        68's    Social History  reports that she has never smoked. She has never used smokeless tobacco. She reports that she does not drink alcohol and does not use drugs.  Allergies  Allergen Reactions   Other Other (See Comments)    Patient has Eosinophilic Esophagitis = Heartburn/acid reflux Eosinophilic esophagitis (EoE) is an inflammation of the esophagus (the tube connecting the mouth to the stomach), caused by a specific white blood cell - the eosinophil. NO eggs, juices, hot dogs, milk, cucumbers, etc...Aaron AasAaron AasAaron Aas    Medications  No current facility-administered medications for this encounter.  Current Outpatient Medications:    alendronate  (FOSAMAX ) 70 MG tablet, Take 1 tablet (70 mg total) by mouth every 7 (seven) days. Take with a full glass of water on an empty stomach., Disp: 4 tablet, Rfl: 0   amLODipine  (NORVASC ) 10 MG tablet, Take 1 tablet (10 mg total) by mouth daily., Disp: 90 tablet,  Rfl: 1   Cholecalciferol  (VITAMIN D3) 50 MCG (2000 UT) TABS, Take 2,000 Units by mouth in the morning., Disp: , Rfl:    citalopram  (CELEXA ) 20 MG tablet, Take 1 tablet (20 mg total) by mouth daily., Disp: 90 tablet, Rfl: 3   co-enzyme Q-10 30 MG capsule, Take 30 mg by mouth daily., Disp: , Rfl:    losartan -hydrochlorothiazide  (HYZAAR) 100-25 MG tablet, Take 1 tablet by mouth daily., Disp: 90 tablet, Rfl: 1   pantoprazole  (PROTONIX ) 40 MG tablet, Take 40 mg by mouth 2 (two) times daily., Disp: , Rfl:    rosuvastatin  (CRESTOR ) 10 MG tablet, Take 1 tablet (10 mg total) by mouth daily., Disp: 90 tablet, Rfl: 1   SYSTANE COMPLETE PF 0.6 % SOLN,  Place 1 drop into both eyes 3 (three) times daily as needed (for dryness or irritation)., Disp: , Rfl:   Vitals   Vitals:   02/29/2024 1649 02-29-2024 1650 Feb 29, 2024 1651 02-29-24 2039  BP:   (!) 157/72 127/86  Pulse:   77 72  Resp:   20 18  Temp:   98.2 F (36.8 C) 98.3 F (36.8 C)  TempSrc:   Oral   SpO2: 100%  98% 99%  Weight:  90.4 kg    Height:  5\' 6"  (1.676 m)      Body mass index is 32.17 kg/m.  Physical Exam   Physical Exam  HEENT-  [place]/AT    Lungs- Respirations unlabored Extremities- No edema  Neurological Examination Mental Status: Alert, oriented x 5, thought content appropriate.  Speech fluent without evidence of aphasia.  Able to follow all commands without difficulty. Cranial Nerves: II: Temporal visual fields intact with no extinction to DSS. PERRL  III,IV, VI: No ptosis. EOMI.  V: Temp sensation equal bilaterally  VII: Smile symmetric VIII: Hearing intact to voice IX,X: No hoarseness XI: Symmetric shoulder shrug XII: Midline tongue extension Motor: BUE 5/5 proximally and distally BLE 5/5 proximally and distally  No pronator drift.  Sensory: Temp and light touch intact throughout, bilaterally. No extinction to DSS.  Deep Tendon Reflexes: 2+ and symmetric throughout Cerebellar: No ataxia with FNF bilaterally  Gait: Deferred  Labs/Imaging/Neurodiagnostic studies   CBC:  Recent Labs  Lab 2024-02-29 1401  WBC 3.7*  HGB 13.0  HCT 37.6  MCV 83.2  PLT 109*   Basic Metabolic Panel:  Lab Results  Component Value Date   NA 136 29-Feb-2024   K 4.7 Feb 29, 2024   CO2 27 2024-02-29   GLUCOSE 101 (H) Feb 29, 2024   BUN 12 2024/02/29   CREATININE 1.01 (H) 2024/02/29   CALCIUM  10.4 (H) 2024-02-29   GFRNONAA >60 29-Feb-2024   GFRAA 72 07/28/2020   Lipid Panel:  Lab Results  Component Value Date   LDLCALC 74 02/13/2023   HgbA1c:  Lab Results  Component Value Date   HGBA1C 5.4 02/15/2023     ASSESSMENT  68 year old female with a PMHx of HTN, ITP,  adenomatous colon polyp (2010), anxiety, melanoma to left leg (1998), osteopenia and vitamin D  deficiency who was initially seen earlier on Saturday for LLE weakness after her leg gave out during an episode of vertigo.She had an unremarkable workup including CT head and labs. She was discharged home and then returned to the ED on Saturday night with recurrence of LLE weakness while at a restaurant - she had tried to get up and her leg gave out again. MRI was obtained, which revealed a punctate acute left cerebellar infarct in addition to a  subacute small cortical-subcortical infarct in the left parieto-occipital region. - Exam reveals no focal deficits.  - MRI brain: Small acute left cerebellar infarct. Probably subacute small infarct in the left parieto-occipital region. Mild chronic small vessel ischemic changes are also noted.  - MRI L-spine without significant spinal canal stenosis.  - Labs: Platelets are low at 109. Also with leukocytosis, WBC 3.7. Cr 1.13 with eGFR 54. Lipid panel is normal.  - Literature review: - Brief review of the literature on ITP and stroke reveals a case report (Cureus. 2017 Dec 3;9(12):e1904) also tabulating 5 other cases of stroke in ITP patients. Per that review, platelet microparticles (PMPs) are the best model currently available to explain the mechanism of thrombosis in ITP patients. Elevated levels of PMPs are detected in patients with ITP and concurrent transient ischemic attacks (TIAs) or ischemic strokes, as compared to normal healthy controls ( J Lab Clin Med. 1992;119:334-345) . Microparticles are also recognised as a major role player in the pathogenesis of coronary artery disease. Microparticles are the main mediators of inflammation; they promote endothelial intracellular adhesion molecule 1 (ICAM-1)-dependent monocyte adhesion and transendothelial migration of plaques. Both formation and progression of atherosclerosis and plaque rupture can be attributed to the  actions of microparticles.Managing ischaemic stroke with underlying ITP remains an area of contention. Review of recently published case reports of ischaemic strokes in ITP (Cureus. 2017 Dec 3;9(12):e1904 )showed that antiplatelet therapy was initiated in more than half of the cases.  - Brief review of the literature on vitamin D  deficiency is less convincing. Per Marek et al (Nutrients. 2022 Jul 4;14(13):2761), "Many studies suggest that maintaining normal serum vitamin D  levels is associated with improvement of the cardiovascular system and a reduction in stroke risk."  - Impression: Acute small vessel ischemic strokes in separate vascular territories in a patient with ITP, the latter possibly representing a predisposing factor. Other risk factors in this patient include obesity and HTN as well as possibly also her vitamin D  deficiency (her most recent vitamin D  level on 2022 was normal and she is currently on vitamin D  supplementation). Will also need to assess for structural risk factors with TTE and CTA.   RECOMMENDATIONS  1. HgbA1c 2. TTE 3. PT consult, OT consult, Speech consult 4. CTA of head and neck 5. Switch Crestor  to atorvastatin 40 mg po qd 6. Start ASA 81 mg po qd: Will hold off on DAPT given her ITP diagnosis.  7. Risk factor modification 8. Telemetry monitoring 9. Frequent neuro checks 10. NPO until passes stroke swallow screen 11. Continue her home vitamin D  supplementation 12. Diet restrictions as listed in Epic (for her eosinophilic esophagitis): No eggs, juices, hot dogs, milk, or cucumbers. 13. Stroke Team to follow  ______________________________________________________________________    Hope Ly, Aleric Froelich, MD [facility]

## 2024-02-03 NOTE — Progress Notes (Signed)
 Pt arrived to room 3w20 via stretcher from the ED. See assessment.

## 2024-02-03 NOTE — Assessment & Plan Note (Signed)
 ITP Platelets 109 today As per ASH guidelines platelet count is greater than 30 and we will hold off any steroids Continue outpatient follow-up with hematologist, continue daily review in light risk-benefit of DAPT Avoiding heparin products or any form of DVT prophylaxis given the dual antiplatelets and thrombocytopenia

## 2024-02-03 NOTE — Assessment & Plan Note (Signed)
   Osteopenia Continue vitamin D  patient on bisphosphonate as outpatient

## 2024-02-03 NOTE — Assessment & Plan Note (Signed)
 Eosinophilic esophagitis Continuing the patient's PPI twice daily

## 2024-02-03 NOTE — Assessment & Plan Note (Signed)
  Hypertension Systolic blood pressure 150 currently Given the presence of acute stroke we will allow for autoregulation Given non-tPA candidate we will add PRNs if SBP approaches 220 Holding patient's home amlodipine  10 mg and losartan  hydrochlorothiazide

## 2024-02-03 NOTE — ED Notes (Signed)
 Patient transported to MRI

## 2024-02-03 NOTE — Assessment & Plan Note (Signed)
   Acute ischemic cerebellar and left parietal occipital infarctions Last known well 10:30 AM CT of the head is within normal limits, presenting as multiple episodes of left lower extremity "giving out" MRI of the brain with 2 probable infarctions: Small acute left cerebellar infarct and subacute small infarct of the left parietal occipital region, MRI lumbar spine without any acute findings Plan: Echocardiogram, MRA to image vessels to exclude LVO, rosuvastatin  20 mg and lipid panel, A1c, continue aspirin  and Plavix  both of which have been started by the neurology consultant, neurology consultation, TSH, telemetry, PT, OT, SLT

## 2024-02-03 NOTE — Assessment & Plan Note (Signed)
  Hyperlipidemia Increasing the patient's home rosuvastatin  from 10 mg to 20 mg for high intensity statin

## 2024-02-04 ENCOUNTER — Encounter: Payer: Self-pay | Admitting: Oncology

## 2024-02-04 ENCOUNTER — Observation Stay (HOSPITAL_BASED_OUTPATIENT_CLINIC_OR_DEPARTMENT_OTHER)

## 2024-02-04 ENCOUNTER — Other Ambulatory Visit: Payer: Self-pay | Admitting: Cardiology

## 2024-02-04 ENCOUNTER — Other Ambulatory Visit (HOSPITAL_COMMUNITY): Payer: Self-pay

## 2024-02-04 DIAGNOSIS — I6389 Other cerebral infarction: Secondary | ICD-10-CM

## 2024-02-04 DIAGNOSIS — I639 Cerebral infarction, unspecified: Secondary | ICD-10-CM | POA: Diagnosis not present

## 2024-02-04 LAB — ECHOCARDIOGRAM COMPLETE
AR max vel: 2.33 cm2
AV Area VTI: 2.44 cm2
AV Area mean vel: 2.39 cm2
AV Mean grad: 4 mmHg
AV Peak grad: 7.7 mmHg
Ao pk vel: 1.39 m/s
Area-P 1/2: 2.64 cm2
Height: 66 in
S' Lateral: 3 cm
Weight: 3245.17 [oz_av]

## 2024-02-04 MED ORDER — ASPIRIN 81 MG PO TBEC
81.0000 mg | DELAYED_RELEASE_TABLET | Freq: Every day | ORAL | 0 refills | Status: DC
  Filled 2024-02-04: qty 60, 60d supply, fill #0

## 2024-02-04 MED ORDER — ROSUVASTATIN CALCIUM 20 MG PO TABS
20.0000 mg | ORAL_TABLET | Freq: Every day | ORAL | 0 refills | Status: DC
  Filled 2024-02-04: qty 60, 60d supply, fill #0

## 2024-02-04 NOTE — Care Management CC44 (Signed)
 Condition Code 44 Documentation Completed  Patient Details  Name: Carol Thomas MRN: [id number] Date of Birth: 04/13/1956   Condition Code 44 given:  Yes Patient signature on Condition Code 44 notice:  Yes Documentation of 2 MD's agreement:  Yes Code 44 added to claim:  Yes    Jonathan Neighbor, RN 02/04/2024, 10:57 AM

## 2024-02-04 NOTE — Plan of Care (Signed)

## 2024-02-04 NOTE — Evaluation (Signed)
 Occupational Therapy Evaluation Patient Details Name: Carol Thomas MRN: [id number] DOB: 10-11-1956 Today's Date: 02/04/2024   History of Present Illness   68 y.o. female presents to [facility] 02/03/24 from [facility] ED with complaints of worsening dizziness x3 days and LE weakness. Brain MRI with 2 probable infarcts of small acute L cerebellar infarct and subacute small infarct of L parietal occipital region. 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 Pt functioning at baseline level however states she has been under a great deal of stress with the recent unexpected passing of her son. Reviewed signs/symptoms of CVA using [employer]. Pt verbalized understanding. CM asked to provide counseling resources if able. No further OT needs.      If plan is discharge home, recommend the following:         Functional Status Assessment   Patient has not had a recent decline in their functional status     Equipment Recommendations   None recommended by OT     Recommendations for Other Services         Precautions/Restrictions   Precautions Precautions: None Restrictions Weight Bearing Restrictions Per Provider Order: No     Mobility Bed Mobility Overal bed mobility: Independent                  Transfers Overall transfer level: Independent                        Balance                                           ADL either performed or assessed with clinical judgement   ADL Overall ADL's : Independent                                             Vision Baseline Vision/History: 1 Wears glasses (reading) Ability to See in Adequate Light: 0 Adequate Patient Visual Report: No change from baseline Vision Assessment?: No apparent visual  deficits;Yes Additional Comments: has floaters but states that is normal for her. Visual fields appear intact     Perception Perception: Within Functional Limits       Praxis         Pertinent Vitals/Pain Pain Assessment Pain Assessment: No/denies pain     Extremity/Trunk Assessment Upper Extremity Assessment Upper Extremity Assessment: Overall WFL for tasks assessed   Lower Extremity Assessment Lower Extremity Assessment: Defer to PT evaluation   Cervical / Trunk Assessment Cervical / Trunk Assessment: Normal   Communication Communication Communication: No apparent difficulties   Cognition Arousal: Alert Behavior During Therapy: WFL for tasks assessed/performed Cognition: No apparent impairments                               Following commands: Intact       Cueing  General Comments          Exercises     Shoulder Instructions      Home Living Family/patient expects to be discharged to:: Private  residence Living Arrangements: Spouse/significant other;Other relatives (66 and 54 y.o. grandsons) Available Help at Discharge: Family;Available PRN/intermittently (husband) Type of Home: House Home Access: Level entry     Home Layout: One level     Bathroom Shower/Tub: Tub/shower unit;Walk-in shower   Bathroom Toilet: Handicapped height Bathroom Accessibility: Yes How Accessible: Accessible via walker Home Equipment: Grab bars - tub/shower;BSC/3in1;Rolling Walker (2 wheels);Cane - single point          Prior Functioning/Environment Prior Level of Function : Independent/Modified Independent;Driving             Mobility Comments: Ind with no AD ADLs Comments: Ind    OT Problem List:     OT Treatment/Interventions:        OT Goals(Current goals can be found in the care plan section)   Acute Rehab OT Goals Patient Stated Goal: take care of her personal business OT Goal Formulation: All assessment and education complete, DC  therapy   OT Frequency:       Co-evaluation              AM-PAC OT "6 Clicks" Daily Activity     Outcome Measure Help from another person eating meals?: None Help from another person taking care of personal grooming?: None Help from another person toileting, which includes using toliet, bedpan, or urinal?: None Help from another person bathing (including washing, rinsing, drying)?: None Help from another person to put on and taking off regular upper body clothing?: None Help from another person to put on and taking off regular lower body clothing?: None 6 Click Score: 24   End of Session Nurse Communication: Mobility status  Activity Tolerance: Patient tolerated treatment well Patient left: in bed;with call bell/phone within reach  OT Visit Diagnosis: Muscle weakness (generalized) (M62.81)                Time: 1018-1040 OT Time Calculation (min): 22 min Charges:  OT General Charges $OT Visit: 1 Visit OT Evaluation $OT Eval Low Complexity: 1 Low  Dywane Peruski, OT/L   Acute OT Clinical Specialist Acute Rehabilitation Services Pager [phone] Office [phone]   [facility] 02/04/2024, 10:47 AM

## 2024-02-04 NOTE — Discharge Summary (Signed)
 Physician Discharge Summary  Carol Thomas ZOX:[id number] DOB: December 07, 1955 DOA: 02/02/2024  PCP: Roosvelt Colla, MD  Admit date: 02/02/2024 Discharge date: 02/04/2024  Admitted From: Home Disposition:  Home  Discharge Condition:Stable CODE STATUS:FULL Diet recommendation: Heart Healthy   Brief/Interim Summary: Patient is a 68 year old female with history of eosinophilic esophagitis, hypertension, ITP who presented with complaint of left lower extremity weakness, back pain.  CT head was not remarkable.  [facility] to [facility] ED for MRI of the brain and lumbar spine.  No loss of consciousness, dysarthria or dysphagia or visual disturbances.  MRI of the brain showed small acute left cerebellar infarct, subacute small infarct of the left occipital region.  MRI of the lumbar spine did not show any acute findings.  Admitted for stroke workup.  Neurology following.  Stroke workup completed.  PT did not recommend any follow-up.  Medically stable for discharge home today.  Following problems were addressed during the hospitalization:   Acute ischemic stroke: Presented with left lower extremity weakness.  MRI of the brain showed   MRI of the brain showed small acute left cerebellar infarct, subacute small infarct of the left occipital region.  Neurology consulted and following.  Initiated stroke workup and completed.  Echo showed EF of 55 to 60%, no intracardiac shunt.  PT/OT/speech consulted, no follow-up recommended. A1c 5.2.  LDL of 76.  Already on Crestor .  Neurology recommending 30-day event monitor on discharge.  Sent message to cardiology. Started on aspirin  81 mg daily.  Patient instructed to hold aspirin  if her platelets level falls less than 50,000.  She needs to follow-up with [facility] neurology in 4 to 8 weeks   Hypertension: She takes amlodipine , losartan , hydrochlorothiazide  at home.  Currently blood pressure is not that high.  We recommend to continue amlodipine  only for now.  I have recommended  her to monitor blood pressure at home   History of ITP: Has mild thrombocytopenia.  Follows with hematology.   History of eosinophilic esophagitis: Continue PPI twice    Mood disorder: On Celexa    Leukopenia: Follows with hematology as an outpatient   CKD stage IIIa: Currently kidney function is stable   History of osteopenia: On vitamin D  supplementation   Hyperlipidemia: On Crestor  10 mg at home.  Made 20 mg on discharge    Discharge Diagnoses:  Principal Problem:   Stroke [facility]) Active Problems:   Essential hypertension   History of ITP   HLD (hyperlipidemia)   Osteopenia   CKD (chronic kidney disease) stage 3, GFR 30-59 ml/min (HCC)   Leukopenia   Mood disorder (HCC)   Eosinophilic esophagitis    Discharge Instructions  Discharge Instructions     Diet - low sodium heart healthy   Complete by: As directed    Discharge instructions   Complete by: As directed    1)Please take prescribed medications as instructed 2)Follow up with your PCP in a week.  Monitor blood pressure at home. Continue amlodipine  only for now. We have discontinued losartan -hydrochlorothiazide  for now because her blood pressure is not that high.  If your blood pressure trends up, you may need to continue this medication. 3)Follow up with neurology in 4 weeks.  Name and number of the provider group has been attached 4)We have started you on aspirin  81 mg daily . Stop taking it if your platelets level falls less then 50000.  4)You will be arranged 30-day cardiac monitor by cardiology   Increase activity slowly   Complete by: As directed  Allergies as of 02/04/2024       Reactions   Other Other (See Comments)   Patient has Eosinophilic Esophagitis = Heartburn/acid reflux Eosinophilic esophagitis (EoE) is an inflammation of the esophagus (the tube connecting the mouth to the stomach), caused by a specific white blood cell - the eosinophil. NO eggs, juices, hot dogs, milk, cucumbers, etc...Aaron AasAaron AasAaron Aas         Medication List     STOP taking these medications    losartan -hydrochlorothiazide  100-25 MG tablet Commonly known as: HYZAAR       TAKE these medications    acetaminophen  500 MG tablet Commonly known as: TYLENOL  Take 1,000 mg by mouth every 6 (six) hours as needed for mild pain (pain score 1-3), fever or headache.   alendronate  70 MG tablet Commonly known as: FOSAMAX  Take 1 tablet (70 mg total) by mouth every 7 (seven) days. Take with a full glass of water on an empty stomach.   amLODipine  10 MG tablet Commonly known as: NORVASC  Take 1 tablet (10 mg total) by mouth daily.   aspirin  EC 81 MG tablet Take 1 tablet (81 mg total) by mouth daily. Swallow whole. Start taking on: February 05, 2024   citalopram  20 MG tablet Commonly known as: CELEXA  Take 1 tablet (20 mg total) by mouth daily.   co-enzyme Q-10 30 MG capsule Take 30 mg by mouth daily.   pantoprazole  40 MG tablet Commonly known as: PROTONIX  Take 40 mg by mouth daily.   rosuvastatin  20 MG tablet Commonly known as: CRESTOR  Take 1 tablet (20 mg total) by mouth daily. Start taking on: February 05, 2024 What changed:  medication strength how much to take   Systane Complete PF 0.6 % Soln Generic drug: Propylene Glycol (PF) Place 1 drop into both eyes 3 (three) times daily as needed (for dryness or irritation).   Vitamin D3 50 MCG (2000 UT) Tabs Take 2,000 Units by mouth in the morning.        Follow-up Information     Roosvelt Colla, MD. Schedule an appointment as soon as possible for a visit in 1 week(s).   Specialty: Family Medicine Contact information: [address] [place] [address] [phone]         [facility]. Schedule an appointment as soon as possible for a visit in 4 week(s).   Specialty: Neurology Contact information: [address] [place]  [address] [phone]               Allergies  Allergen  Reactions   Other Other (See Comments)    Patient has Eosinophilic Esophagitis = Heartburn/acid reflux Eosinophilic esophagitis (EoE) is an inflammation of the esophagus (the tube connecting the mouth to the stomach), caused by a specific white blood cell - the eosinophil. NO eggs, juices, hot dogs, milk, cucumbers, etc...Aaron AasAaron AasAaron Aas    Consultations: Neurology   Procedures/Studies: ECHOCARDIOGRAM COMPLETE Result Date: 02/04/2024    ECHOCARDIOGRAM REPORT   Patient Name:   Carol Thomas Date of Exam: 02/04/2024 Medical Rec #:  [id number]      Height:       66.0 in Accession #:    [id number]     Weight:       202.8 lb Date of Birth:  01/12/1956      BSA:          2.012 m Patient Age:    67 years       BP:  127/71 mmHg Patient Gender: F              HR:           60 bpm. Exam Location:  Inpatient Procedure: 2D Echo, Cardiac Doppler and Color Doppler (Both Spectral and Color            Flow Doppler were utilized during procedure). Indications:    TIA G45.9  History:        Patient has no prior history of Echocardiogram examinations.                 Risk Factors:Hypertension.  Sonographer:    Astrid Blamer Referring Phys: [id number] ANDREW C CORE IMPRESSIONS  1. Left ventricular ejection fraction, by estimation, is 55 to 60%. The left ventricle has normal function. The left ventricle has no regional wall motion abnormalities. Left ventricular diastolic parameters are consistent with Grade I diastolic dysfunction (impaired relaxation).  2. Right ventricular systolic function is normal. The right ventricular size is normal. Tricuspid regurgitation signal is inadequate for assessing PA pressure.  3. The mitral valve is normal in structure. No evidence of mitral valve regurgitation. No evidence of mitral stenosis.  4. The aortic valve is tricuspid. There is mild calcification of the aortic valve. Aortic valve regurgitation is not visualized. No aortic stenosis is present.  5. IVC not visualized.  6. Technically  difficult study so unable to estimate PA systolic pressure. FINDINGS  Left Ventricle: Left ventricular ejection fraction, by estimation, is 55 to 60%. The left ventricle has normal function. The left ventricle has no regional wall motion abnormalities. The left ventricular internal cavity size was normal in size. There is  no left ventricular hypertrophy. Left ventricular diastolic parameters are consistent with Grade I diastolic dysfunction (impaired relaxation). Right Ventricle: The right ventricular size is normal. No increase in right ventricular wall thickness. Right ventricular systolic function is normal. Tricuspid regurgitation signal is inadequate for assessing PA pressure. Left Atrium: Left atrial size was normal in size. Right Atrium: Right atrial size was normal in size. Pericardium: Trivial pericardial effusion is present. Mitral Valve: The mitral valve is normal in structure. No evidence of mitral valve regurgitation. No evidence of mitral valve stenosis. Tricuspid Valve: The tricuspid valve is normal in structure. Tricuspid valve regurgitation is trivial. Aortic Valve: The aortic valve is tricuspid. There is mild calcification of the aortic valve. Aortic valve regurgitation is not visualized. No aortic stenosis is present. Aortic valve mean gradient measures 4.0 mmHg. Aortic valve peak gradient measures 7.7 mmHg. Aortic valve area, by VTI measures 2.44 cm. Pulmonic Valve: The pulmonic valve was normal in structure. Pulmonic valve regurgitation is not visualized. Aorta: The aortic root is normal in size and structure. Venous: IVC not visualized. IAS/Shunts: No atrial level shunt detected by color flow Doppler.  LEFT VENTRICLE PLAX 2D LVIDd:         4.50 cm   Diastology LVIDs:         3.00 cm   LV e' medial:    8.27 cm/s LV PW:         0.90 cm   LV E/e' medial:  8.2 LV IVS:        1.10 cm   LV e' lateral:   11.40 cm/s LVOT diam:     2.00 cm   LV E/e' lateral: 5.9 LV SV:         85 LV SV Index:   42  LVOT Area:     3.14 cm  RIGHT VENTRICLE RV S prime:     11.10 cm/s TAPSE (M-mode): 1.5 cm LEFT ATRIUM             Index        RIGHT ATRIUM           Index LA Vol (A2C):   35.9 ml 17.85 ml/m  RA Area:     12.80 cm LA Vol (A4C):   50.0 ml 24.85 ml/m  RA Volume:   27.90 ml  13.87 ml/m LA Biplane Vol: 42.3 ml 21.03 ml/m  AORTIC VALVE AV Area (Vmax):    2.33 cm AV Area (Vmean):   2.39 cm AV Area (VTI):     2.44 cm AV Vmax:           139.00 cm/s AV Vmean:          102.000 cm/s AV VTI:            0.347 m AV Peak Grad:      7.7 mmHg AV Mean Grad:      4.0 mmHg LVOT Vmax:         103.00 cm/s LVOT Vmean:        77.500 cm/s LVOT VTI:          0.269 m LVOT/AV VTI ratio: 0.78  AORTA Ao Root diam: 2.90 cm MITRAL VALVE MV Area (PHT): 2.64 cm    SHUNTS MV Decel Time: 287 msec    Systemic VTI:  0.27 m MV E velocity: 67.60 cm/s  Systemic Diam: 2.00 cm MV A velocity: 80.70 cm/s MV E/A ratio:  0.84 Dalton McleanMD Electronically signed by Archer Bear Signature Date/Time: 02/04/2024/10:09:15 AM    Final    MR ANGIO HEAD WO CONTRAST Result Date: 02/03/2024 CLINICAL DATA:  68 year old female with neurologic deficit and small acute to subacute infarcts in the left cerebellar and PCA territories on MRI yesterday. EXAM: MRA HEAD WITHOUT CONTRAST TECHNIQUE: Angiographic images of the Circle of Willis were acquired using MRA technique without intravenous contrast. COMPARISON:  Brain MRI yesterday and neck MRA today reported separately. FINDINGS: Anterior circulation: Antegrade flow in both ICA siphons. Supraclinoid irregularity which is more pronounced on the left. Mild left supraclinoid stenosis. Ophthalmic artery origins appear normal. Patent carotid termini. Normal MCA and ACA origins. Normal anterior communicating artery and visible ACA branches. Visible bilateral MCA branches are patent and within normal limits. Posterior circulation: Antegrade flow in the posterior circulation. Normal distal vertebral arteries and  vertebrobasilar junction. Right V4 mildly dominant as on the comparison neck. Both PICA origins are patent. Patent basilar artery without stenosis. AICA, SCA and PCA origins appear patent. Posterior communicating arteries are diminutive or absent. Mild right PCA P1/P2 segment junction irregularity without stenosis. Tortuous left P1 segment and mild to moderate left P1/P2 segment junction irregularity and stenosis on series 1021, image 4. Maintained distal PCA flow. Anatomic variants: Mildly dominant right vertebral artery. Other: No evidence of intracranial mass effect or ventriculomegaly. IMPRESSION: No large vessel occlusion. Evidence of intracranial atherosclerosis with up to Moderate Left PCA P1/P2 junction stenosis. Mild supraclinoid ICA siphon stenosis on the Left. Electronically Signed   By: Marlise Simpers M.D.   On: 02/03/2024 06:24   MR ANGIO NECK WO CONTRAST Result Date: 02/03/2024 CLINICAL DATA:  68 year old female with neurologic deficit and small acute to subacute infarcts in the left cerebellar and PCA territories on MRI yesterday. EXAM: MRA NECK WITHOUT CONTRAST TECHNIQUE: Angiographic images of the neck were acquired using MRA technique without intravenous contrast.  Carotid stenosis measurements (when applicable) are obtained utilizing NASCET criteria, using the distal internal carotid diameter as the denominator. COMPARISON:  Brain MRI yesterday. FINDINGS: Time-of-flight neck MRA images demonstrate antegrade flow in the bilateral cervical carotid and vertebral arteries. The right vertebral artery appears mildly dominant. No evidence of V2 or V3 stenosis. Both carotid bifurcations are patent, within normal limits. No evidence of cervical ICA stenosis. Intracranial circulation detailed separately. Other: None. IMPRESSION: Normal noncontrast Neck MRA. Electronically Signed   By: Marlise Simpers M.D.   On: 02/03/2024 06:21   MR LUMBAR SPINE WO CONTRAST Result Date: 02/02/2024 CLINICAL DATA:  Low back pain, cauda  equina syndrome suspected EXAM: MRI LUMBAR SPINE WITHOUT CONTRAST TECHNIQUE: Multiplanar, multisequence MR imaging of the lumbar spine was performed. No intravenous contrast was administered. COMPARISON:  None Available. FINDINGS: Segmentation: Standard. Alignment:  No substantial sagittal subluxation. Vertebrae: Heterogeneous marrow. No acute fracture, evidence of discitis, or bone lesion. Conus medullaris and cauda equina: Conus extends to the L1-L2 level. Conus and cauda equina appear normal. Paraspinal and other soft tissues: Unremarkable. Disc levels: T12-L1: No significant disc protrusion, foraminal stenosis, or canal stenosis. L1-L2: No significant disc protrusion, foraminal stenosis, or canal stenosis. L2-L3: Mild disc bulging.  No significant stenosis. L3-L4: Mild disc bulging. Ligamentum flavum thickening. No significant stenosis. L4-L5: Mild disc bulging. Facet arthropathy and small joint effusions. No significant stenosis. L5-S1: Facet arthropathy.  No significant stenosis. IMPRESSION: No evidence of acute abnormality or significant stenosis. Electronically Signed   By: Stevenson Elbe M.D.   On: 02/02/2024 20:07   MR BRAIN WO CONTRAST Result Date: 02/02/2024 CLINICAL DATA:  Neuro deficit, acute, stroke suspected EXAM: MRI HEAD WITHOUT CONTRAST TECHNIQUE: Multiplanar, multiecho pulse sequences of the brain and surrounding structures were obtained without intravenous contrast. COMPARISON:  CT head from today. FINDINGS: Brain: Small acute left cerebellar infarct. Probably subacute small infarct in the left parieto-occipital region. Mild edema without mass effect. No evidence of acute hemorrhage, mass lesion, midline shift or hydrocephalus. Vascular: Major arterial flow voids are maintained at the skull base. Skull and upper cervical spine: Normal marrow signal. Sinuses/Orbits: Clear sinuses.  No acute orbital findings. Other: No mastoid effusions. IMPRESSION: 1. Small acute left cerebellar infarct. 2.  Probably subacute small infarct in the left parieto-occipital region. Electronically Signed   By: Stevenson Elbe M.D.   On: 02/02/2024 19:43   CT Head Wo Contrast Result Date: 02/02/2024 CLINICAL DATA:  Head headache, increasing frequency or severity. Dizziness over the last 3 days. EXAM: CT HEAD WITHOUT CONTRAST TECHNIQUE: Contiguous axial images were obtained from the base of the skull through the vertex without intravenous contrast. RADIATION DOSE REDUCTION: This exam was performed according to the departmental dose-optimization program which includes automated exposure control, adjustment of the mA and/or kV according to patient size and/or use of iterative reconstruction technique. COMPARISON:  None Available. FINDINGS: Brain: The brain shows a normal appearance without evidence of malformation, atrophy, old or acute small or large vessel infarction, mass lesion, hemorrhage, hydrocephalus or extra-axial collection. Vascular: No hyperdense vessel. No evidence of atherosclerotic calcification. Skull: Normal.  No traumatic finding.  No focal bone lesion. Sinuses/Orbits: Sinuses are clear. Orbits appear normal. Mastoids are clear. Other: Middle ears and mastoid air cells are clear. IMPRESSION: Normal head CT. Electronically Signed   By: Bettylou Brunner M.D.   On: 02/02/2024 15:16      Subjective: Patient seen and examined at bedside today.  Hemodynamically stable.  Very comfortable.  Denies any weakness.  Speech is clear.  Alert and oriented.  Medically stable for discharge home today  Discharge Exam: Vitals:   02/04/24 0444 02/04/24 0833  BP: 127/71 (!) 146/80  Pulse: 70 61  Resp:  17  Temp: 98.4 F (36.9 C) 97.7 F (36.5 C)  SpO2: 98% 97%   Vitals:   02/03/24 2024 02/04/24 0027 02/04/24 0444 02/04/24 0833  BP: (!) 157/78 130/81 127/71 (!) 146/80  Pulse: 74 72 70 61  Resp: 18   17  Temp: 97.9 F (36.6 C) 98.6 F (37 C) 98.4 F (36.9 C) 97.7 F (36.5 C)  TempSrc: Oral Oral Oral Oral   SpO2: 99% 96% 98% 97%  Weight:      Height:        General: Pt is alert, awake, not in acute distress Cardiovascular: RRR, S1/S2 +, no rubs, no gallops Respiratory: CTA bilaterally, no wheezing, no rhonchi Abdominal: Soft, NT, ND, bowel sounds + Extremities: no edema, no cyanosis    The results of significant diagnostics from this hospitalization (including imaging, microbiology, ancillary and laboratory) are listed below for reference.     Microbiology: No results found for this or any previous visit (from the past 240 hours).   Labs: BNP (last 3 results) No results for input(s): "BNP" in the last 8760 hours. Basic Metabolic Panel: Recent Labs  Lab 02/02/24 0013 02/02/24 1401 02/03/24 0400  NA 138 136 134*  K 4.2 4.7 3.5  CL 102 101 99  CO2 25 27 24   GLUCOSE 106* 101* 104*  BUN 14 12 14   CREATININE 1.13* 1.01* 1.02*  CALCIUM  9.9 10.4* 9.6   Liver Function Tests: Recent Labs  Lab 02/02/24 0013 02/03/24 0400  AST 25 23  ALT 25 24  ALKPHOS 63 64  BILITOT 0.8 1.0  PROT 6.7 6.4*  ALBUMIN 3.8 3.7   No results for input(s): "LIPASE", "AMYLASE" in the last 168 hours. No results for input(s): "AMMONIA" in the last 168 hours. CBC: Recent Labs  Lab 02/02/24 0013 02/02/24 1401 02/03/24 0400  WBC 4.2 3.7* 3.9*  NEUTROABS 2.5  --   --   HGB 12.6 13.0 12.2  HCT 37.4 37.6 35.9*  MCV 85.0 83.2 85.1  PLT 102* 109* 104*   Cardiac Enzymes: No results for input(s): "CKTOTAL", "CKMB", "CKMBINDEX", "TROPONINI" in the last 168 hours. BNP: Invalid input(s): "POCBNP" CBG: No results for input(s): "GLUCAP" in the last 168 hours. D-Dimer No results for input(s): "DDIMER" in the last 72 hours. Hgb A1c Recent Labs    02/03/24 0216  HGBA1C 5.2   Lipid Profile Recent Labs    02/03/24 0216  CHOL 151  HDL 63  LDLCALC 76  TRIG 58  CHOLHDL 2.4   Thyroid function studies Recent Labs    02/03/24 0216  TSH 7.257*   Anemia work up No results for input(s):  "VITAMINB12", "FOLATE", "FERRITIN", "TIBC", "IRON", "RETICCTPCT" in the last 72 hours. Urinalysis    Component Value Date/Time   COLORURINE YELLOW 02/03/2024 0930   APPEARANCEUR CLEAR 02/03/2024 0930   LABSPEC 1.014 02/03/2024 0930   PHURINE 5.0 02/03/2024 0930   GLUCOSEU NEGATIVE 02/03/2024 0930   HGBUR NEGATIVE 02/03/2024 0930   BILIRUBINUR NEGATIVE 02/03/2024 0930   KETONESUR NEGATIVE 02/03/2024 0930   PROTEINUR NEGATIVE 02/03/2024 0930   UROBILINOGEN 0.2 02/27/2014 1503   NITRITE NEGATIVE 02/03/2024 0930   LEUKOCYTESUR TRACE (A) 02/03/2024 0930   Sepsis Labs Recent Labs  Lab 02/02/24 0013 02/02/24 1401 02/03/24 0400  WBC 4.2 3.7* 3.9*  Microbiology No results found for this or any previous visit (from the past 240 hours).  Please note: You were cared for by a hospitalist during your hospital stay. Once you are discharged, your primary care physician will handle any further medical issues. Please note that NO REFILLS for any discharge medications will be authorized once you are discharged, as it is imperative that you return to your primary care physician (or establish a relationship with a primary care physician if you do not have one) for your post hospital discharge needs so that they can reassess your need for medications and monitor your lab values.    Time coordinating discharge: 40 minutes  SIGNED:   Leona Rake, MD  [facility] 02/04/2024, 10:19 AM Pager [phone]  If 7PM-7AM, please contact night-coverage [email or web address] [facility] Physician Discharge Summary  Carol Thomas DOB: 23-Dec-1955 DOA: 02/02/2024  PCP: Roosvelt Colla, MD  Admit date: 02/02/2024 Discharge date: 02/04/2024  Admitted From: Home Disposition:  Home  Discharge Condition:Stable CODE STATUS:FULL, DNR, Comfort Care Diet recommendation: Heart Healthy / Carb Modified / Regular / Dysphagia   Brief/Interim Summary:   Following problems were addressed during  the hospitalization:   Discharge Diagnoses:  Principal Problem:   Stroke [facility]) Active Problems:   Essential hypertension   History of ITP   HLD (hyperlipidemia)   Osteopenia   CKD (chronic kidney disease) stage 3, GFR 30-59 ml/min (HCC)   Leukopenia   Mood disorder (HCC)   Eosinophilic esophagitis    Discharge Instructions  Discharge Instructions     Diet - low sodium heart healthy   Complete by: As directed    Discharge instructions   Complete by: As directed    1)Please take prescribed medications as instructed 2)Follow up with your PCP in a week.  Monitor blood pressure at home. Continue amlodipine  only for now. We have discontinued losartan -hydrochlorothiazide  for now because her blood pressure is not that high.  If your blood pressure trends up, you may need to continue this medication. 3)Follow up with neurology in 4 weeks.  Name and number of the provider group has been attached 4)We have started you on aspirin  81 mg daily . Stop taking it if your platelets level falls less then 50000.  4)You will be arranged 30-day cardiac monitor by cardiology   Increase activity slowly   Complete by: As directed       Allergies as of 02/04/2024       Reactions   Other Other (See Comments)   Patient has Eosinophilic Esophagitis = Heartburn/acid reflux Eosinophilic esophagitis (EoE) is an inflammation of the esophagus (the tube connecting the mouth to the stomach), caused by a specific white blood cell - the eosinophil. NO eggs, juices, hot dogs, milk, cucumbers, etc...Aaron AasAaron AasAaron Aas        Medication List     STOP taking these medications    losartan -hydrochlorothiazide  100-25 MG tablet Commonly known as: HYZAAR       TAKE these medications    acetaminophen  500 MG tablet Commonly known as: TYLENOL  Take 1,000 mg by mouth every 6 (six) hours as needed for mild pain (pain score 1-3), fever or headache.   alendronate  70 MG tablet Commonly known as: FOSAMAX  Take 1 tablet (70  mg total) by mouth every 7 (seven) days. Take with a full glass of water on an empty stomach.   amLODipine  10 MG tablet Commonly known as: NORVASC  Take 1 tablet (10 mg total) by mouth daily.   aspirin  EC 81 MG  tablet Take 1 tablet (81 mg total) by mouth daily. Swallow whole. Start taking on: February 05, 2024   citalopram  20 MG tablet Commonly known as: CELEXA  Take 1 tablet (20 mg total) by mouth daily.   co-enzyme Q-10 30 MG capsule Take 30 mg by mouth daily.   pantoprazole  40 MG tablet Commonly known as: PROTONIX  Take 40 mg by mouth daily.   rosuvastatin  20 MG tablet Commonly known as: CRESTOR  Take 1 tablet (20 mg total) by mouth daily. Start taking on: February 05, 2024 What changed:  medication strength how much to take   Systane Complete PF 0.6 % Soln Generic drug: Propylene Glycol (PF) Place 1 drop into both eyes 3 (three) times daily as needed (for dryness or irritation).   Vitamin D3 50 MCG (2000 UT) Tabs Take 2,000 Units by mouth in the morning.        Follow-up Information     Roosvelt Colla, MD. Schedule an appointment as soon as possible for a visit in 1 week(s).   Specialty: Family Medicine Contact information: [address] [place] [address] [phone]         [facility]. Schedule an appointment as soon as possible for a visit in 4 week(s).   Specialty: Neurology Contact information: [address] [place]   [address] [phone]               Allergies  Allergen Reactions   Other Other (See Comments)    Patient has Eosinophilic Esophagitis = Heartburn/acid reflux Eosinophilic esophagitis (EoE) is an inflammation of the esophagus (the tube connecting the mouth to the stomach), caused by a specific white blood cell - the eosinophil. NO eggs, juices, hot dogs, milk, cucumbers, etc...Aaron AasAaron AasAaron Aas    Consultations:    Procedures/Studies: ECHOCARDIOGRAM COMPLETE Result Date:  02/04/2024    ECHOCARDIOGRAM REPORT   Patient Name:   Carol Thomas Date of Exam: 02/04/2024 Medical Rec #:  [id number]      Height:       66.0 in Accession #:    [id number]     Weight:       202.8 lb Date of Birth:  1956/02/11      BSA:          2.012 m Patient Age:    67 years       BP:           127/71 mmHg Patient Gender: F              HR:           60 bpm. Exam Location:  Inpatient Procedure: 2D Echo, Cardiac Doppler and Color Doppler (Both Spectral and Color            Flow Doppler were utilized during procedure). Indications:    TIA G45.9  History:        Patient has no prior history of Echocardiogram examinations.                 Risk Factors:Hypertension.  Sonographer:    Astrid Blamer Referring Phys: [id number] ANDREW C CORE IMPRESSIONS  1. Left ventricular ejection fraction, by estimation, is 55 to 60%. The left ventricle has normal function. The left ventricle has no regional wall motion abnormalities. Left ventricular diastolic parameters are consistent with Grade I diastolic dysfunction (impaired relaxation).  2. Right ventricular systolic function is normal. The right ventricular size is normal. Tricuspid regurgitation signal is inadequate for assessing PA pressure.  3.  The mitral valve is normal in structure. No evidence of mitral valve regurgitation. No evidence of mitral stenosis.  4. The aortic valve is tricuspid. There is mild calcification of the aortic valve. Aortic valve regurgitation is not visualized. No aortic stenosis is present.  5. IVC not visualized.  6. Technically difficult study so unable to estimate PA systolic pressure. FINDINGS  Left Ventricle: Left ventricular ejection fraction, by estimation, is 55 to 60%. The left ventricle has normal function. The left ventricle has no regional wall motion abnormalities. The left ventricular internal cavity size was normal in size. There is  no left ventricular hypertrophy. Left ventricular diastolic parameters are consistent with Grade I  diastolic dysfunction (impaired relaxation). Right Ventricle: The right ventricular size is normal. No increase in right ventricular wall thickness. Right ventricular systolic function is normal. Tricuspid regurgitation signal is inadequate for assessing PA pressure. Left Atrium: Left atrial size was normal in size. Right Atrium: Right atrial size was normal in size. Pericardium: Trivial pericardial effusion is present. Mitral Valve: The mitral valve is normal in structure. No evidence of mitral valve regurgitation. No evidence of mitral valve stenosis. Tricuspid Valve: The tricuspid valve is normal in structure. Tricuspid valve regurgitation is trivial. Aortic Valve: The aortic valve is tricuspid. There is mild calcification of the aortic valve. Aortic valve regurgitation is not visualized. No aortic stenosis is present. Aortic valve mean gradient measures 4.0 mmHg. Aortic valve peak gradient measures 7.7 mmHg. Aortic valve area, by VTI measures 2.44 cm. Pulmonic Valve: The pulmonic valve was normal in structure. Pulmonic valve regurgitation is not visualized. Aorta: The aortic root is normal in size and structure. Venous: IVC not visualized. IAS/Shunts: No atrial level shunt detected by color flow Doppler.  LEFT VENTRICLE PLAX 2D LVIDd:         4.50 cm   Diastology LVIDs:         3.00 cm   LV e' medial:    8.27 cm/s LV PW:         0.90 cm   LV E/e' medial:  8.2 LV IVS:        1.10 cm   LV e' lateral:   11.40 cm/s LVOT diam:     2.00 cm   LV E/e' lateral: 5.9 LV SV:         85 LV SV Index:   42 LVOT Area:     3.14 cm  RIGHT VENTRICLE RV S prime:     11.10 cm/s TAPSE (M-mode): 1.5 cm LEFT ATRIUM             Index        RIGHT ATRIUM           Index LA Vol (A2C):   35.9 ml 17.85 ml/m  RA Area:     12.80 cm LA Vol (A4C):   50.0 ml 24.85 ml/m  RA Volume:   27.90 ml  13.87 ml/m LA Biplane Vol: 42.3 ml 21.03 ml/m  AORTIC VALVE AV Area (Vmax):    2.33 cm AV Area (Vmean):   2.39 cm AV Area (VTI):     2.44 cm AV  Vmax:           139.00 cm/s AV Vmean:          102.000 cm/s AV VTI:            0.347 m AV Peak Grad:      7.7 mmHg AV Mean Grad:      4.0 mmHg  LVOT Vmax:         103.00 cm/s LVOT Vmean:        77.500 cm/s LVOT VTI:          0.269 m LVOT/AV VTI ratio: 0.78  AORTA Ao Root diam: 2.90 cm MITRAL VALVE MV Area (PHT): 2.64 cm    SHUNTS MV Decel Time: 287 msec    Systemic VTI:  0.27 m MV E velocity: 67.60 cm/s  Systemic Diam: 2.00 cm MV A velocity: 80.70 cm/s MV E/A ratio:  0.84 Dalton McleanMD Electronically signed by Archer Bear Signature Date/Time: 02/04/2024/10:09:15 AM    Final    MR ANGIO HEAD WO CONTRAST Result Date: 02/03/2024 CLINICAL DATA:  68 year old female with neurologic deficit and small acute to subacute infarcts in the left cerebellar and PCA territories on MRI yesterday. EXAM: MRA HEAD WITHOUT CONTRAST TECHNIQUE: Angiographic images of the Circle of Willis were acquired using MRA technique without intravenous contrast. COMPARISON:  Brain MRI yesterday and neck MRA today reported separately. FINDINGS: Anterior circulation: Antegrade flow in both ICA siphons. Supraclinoid irregularity which is more pronounced on the left. Mild left supraclinoid stenosis. Ophthalmic artery origins appear normal. Patent carotid termini. Normal MCA and ACA origins. Normal anterior communicating artery and visible ACA branches. Visible bilateral MCA branches are patent and within normal limits. Posterior circulation: Antegrade flow in the posterior circulation. Normal distal vertebral arteries and vertebrobasilar junction. Right V4 mildly dominant as on the comparison neck. Both PICA origins are patent. Patent basilar artery without stenosis. AICA, SCA and PCA origins appear patent. Posterior communicating arteries are diminutive or absent. Mild right PCA P1/P2 segment junction irregularity without stenosis. Tortuous left P1 segment and mild to moderate left P1/P2 segment junction irregularity and stenosis on series 1021,  image 4. Maintained distal PCA flow. Anatomic variants: Mildly dominant right vertebral artery. Other: No evidence of intracranial mass effect or ventriculomegaly. IMPRESSION: No large vessel occlusion. Evidence of intracranial atherosclerosis with up to Moderate Left PCA P1/P2 junction stenosis. Mild supraclinoid ICA siphon stenosis on the Left. Electronically Signed   By: Marlise Simpers M.D.   On: 02/03/2024 06:24   MR ANGIO NECK WO CONTRAST Result Date: 02/03/2024 CLINICAL DATA:  68 year old female with neurologic deficit and small acute to subacute infarcts in the left cerebellar and PCA territories on MRI yesterday. EXAM: MRA NECK WITHOUT CONTRAST TECHNIQUE: Angiographic images of the neck were acquired using MRA technique without intravenous contrast. Carotid stenosis measurements (when applicable) are obtained utilizing NASCET criteria, using the distal internal carotid diameter as the denominator. COMPARISON:  Brain MRI yesterday. FINDINGS: Time-of-flight neck MRA images demonstrate antegrade flow in the bilateral cervical carotid and vertebral arteries. The right vertebral artery appears mildly dominant. No evidence of V2 or V3 stenosis. Both carotid bifurcations are patent, within normal limits. No evidence of cervical ICA stenosis. Intracranial circulation detailed separately. Other: None. IMPRESSION: Normal noncontrast Neck MRA. Electronically Signed   By: Marlise Simpers M.D.   On: 02/03/2024 06:21   MR LUMBAR SPINE WO CONTRAST Result Date: 02/02/2024 CLINICAL DATA:  Low back pain, cauda equina syndrome suspected EXAM: MRI LUMBAR SPINE WITHOUT CONTRAST TECHNIQUE: Multiplanar, multisequence MR imaging of the lumbar spine was performed. No intravenous contrast was administered. COMPARISON:  None Available. FINDINGS: Segmentation: Standard. Alignment:  No substantial sagittal subluxation. Vertebrae: Heterogeneous marrow. No acute fracture, evidence of discitis, or bone lesion. Conus medullaris and cauda equina:  Conus extends to the L1-L2 level. Conus and cauda equina appear normal. Paraspinal and other soft tissues:  Unremarkable. Disc levels: T12-L1: No significant disc protrusion, foraminal stenosis, or canal stenosis. L1-L2: No significant disc protrusion, foraminal stenosis, or canal stenosis. L2-L3: Mild disc bulging.  No significant stenosis. L3-L4: Mild disc bulging. Ligamentum flavum thickening. No significant stenosis. L4-L5: Mild disc bulging. Facet arthropathy and small joint effusions. No significant stenosis. L5-S1: Facet arthropathy.  No significant stenosis. IMPRESSION: No evidence of acute abnormality or significant stenosis. Electronically Signed   By: Stevenson Elbe M.D.   On: 02/02/2024 20:07   MR BRAIN WO CONTRAST Result Date: 02/02/2024 CLINICAL DATA:  Neuro deficit, acute, stroke suspected EXAM: MRI HEAD WITHOUT CONTRAST TECHNIQUE: Multiplanar, multiecho pulse sequences of the brain and surrounding structures were obtained without intravenous contrast. COMPARISON:  CT head from today. FINDINGS: Brain: Small acute left cerebellar infarct. Probably subacute small infarct in the left parieto-occipital region. Mild edema without mass effect. No evidence of acute hemorrhage, mass lesion, midline shift or hydrocephalus. Vascular: Major arterial flow voids are maintained at the skull base. Skull and upper cervical spine: Normal marrow signal. Sinuses/Orbits: Clear sinuses.  No acute orbital findings. Other: No mastoid effusions. IMPRESSION: 1. Small acute left cerebellar infarct. 2. Probably subacute small infarct in the left parieto-occipital region. Electronically Signed   By: Stevenson Elbe M.D.   On: 02/02/2024 19:43   CT Head Wo Contrast Result Date: 02/02/2024 CLINICAL DATA:  Head headache, increasing frequency or severity. Dizziness over the last 3 days. EXAM: CT HEAD WITHOUT CONTRAST TECHNIQUE: Contiguous axial images were obtained from the base of the skull through the vertex without  intravenous contrast. RADIATION DOSE REDUCTION: This exam was performed according to the departmental dose-optimization program which includes automated exposure control, adjustment of the mA and/or kV according to patient size and/or use of iterative reconstruction technique. COMPARISON:  None Available. FINDINGS: Brain: The brain shows a normal appearance without evidence of malformation, atrophy, old or acute small or large vessel infarction, mass lesion, hemorrhage, hydrocephalus or extra-axial collection. Vascular: No hyperdense vessel. No evidence of atherosclerotic calcification. Skull: Normal.  No traumatic finding.  No focal bone lesion. Sinuses/Orbits: Sinuses are clear. Orbits appear normal. Mastoids are clear. Other: Middle ears and mastoid air cells are clear. IMPRESSION: Normal head CT. Electronically Signed   By: Bettylou Brunner M.D.   On: 02/02/2024 15:16      Subjective:   Discharge Exam: Vitals:   02/04/24 0444 02/04/24 0833  BP: 127/71 (!) 146/80  Pulse: 70 61  Resp:  17  Temp: 98.4 F (36.9 C) 97.7 F (36.5 C)  SpO2: 98% 97%   Vitals:   02/03/24 2024 02/04/24 0027 02/04/24 0444 02/04/24 0833  BP: (!) 157/78 130/81 127/71 (!) 146/80  Pulse: 74 72 70 61  Resp: 18   17  Temp: 97.9 F (36.6 C) 98.6 F (37 C) 98.4 F (36.9 C) 97.7 F (36.5 C)  TempSrc: Oral Oral Oral Oral  SpO2: 99% 96% 98% 97%  Weight:      Height:        General: Pt is alert, awake, not in acute distress Cardiovascular: RRR, S1/S2 +, no rubs, no gallops Respiratory: CTA bilaterally, no wheezing, no rhonchi Abdominal: Soft, NT, ND, bowel sounds + Extremities: no edema, no cyanosis    The results of significant diagnostics from this hospitalization (including imaging, microbiology, ancillary and laboratory) are listed below for reference.     Microbiology: No results found for this or any previous visit (from the past 240 hours).   Labs: BNP (last 3 results) No  results for input(s):  "BNP" in the last 8760 hours. Basic Metabolic Panel: Recent Labs  Lab 02/02/24 0013 02/02/24 1401 02/03/24 0400  NA 138 136 134*  K 4.2 4.7 3.5  CL 102 101 99  CO2 25 27 24   GLUCOSE 106* 101* 104*  BUN 14 12 14   CREATININE 1.13* 1.01* 1.02*  CALCIUM  9.9 10.4* 9.6   Liver Function Tests: Recent Labs  Lab 02/02/24 0013 02/03/24 0400  AST 25 23  ALT 25 24  ALKPHOS 63 64  BILITOT 0.8 1.0  PROT 6.7 6.4*  ALBUMIN 3.8 3.7   No results for input(s): "LIPASE", "AMYLASE" in the last 168 hours. No results for input(s): "AMMONIA" in the last 168 hours. CBC: Recent Labs  Lab 02/02/24 0013 02/02/24 1401 02/03/24 0400  WBC 4.2 3.7* 3.9*  NEUTROABS 2.5  --   --   HGB 12.6 13.0 12.2  HCT 37.4 37.6 35.9*  MCV 85.0 83.2 85.1  PLT 102* 109* 104*   Cardiac Enzymes: No results for input(s): "CKTOTAL", "CKMB", "CKMBINDEX", "TROPONINI" in the last 168 hours. BNP: Invalid input(s): "POCBNP" CBG: No results for input(s): "GLUCAP" in the last 168 hours. D-Dimer No results for input(s): "DDIMER" in the last 72 hours. Hgb A1c Recent Labs    02/03/24 0216  HGBA1C 5.2   Lipid Profile Recent Labs    02/03/24 0216  CHOL 151  HDL 63  LDLCALC 76  TRIG 58  CHOLHDL 2.4   Thyroid function studies Recent Labs    02/03/24 0216  TSH 7.257*   Anemia work up No results for input(s): "VITAMINB12", "FOLATE", "FERRITIN", "TIBC", "IRON", "RETICCTPCT" in the last 72 hours. Urinalysis    Component Value Date/Time   COLORURINE YELLOW 02/03/2024 0930   APPEARANCEUR CLEAR 02/03/2024 0930   LABSPEC 1.014 02/03/2024 0930   PHURINE 5.0 02/03/2024 0930   GLUCOSEU NEGATIVE 02/03/2024 0930   HGBUR NEGATIVE 02/03/2024 0930   BILIRUBINUR NEGATIVE 02/03/2024 0930   KETONESUR NEGATIVE 02/03/2024 0930   PROTEINUR NEGATIVE 02/03/2024 0930   UROBILINOGEN 0.2 02/27/2014 1503   NITRITE NEGATIVE 02/03/2024 0930   LEUKOCYTESUR TRACE (A) 02/03/2024 0930   Sepsis Labs Recent Labs  Lab  02/02/24 0013 02/02/24 1401 02/03/24 0400  WBC 4.2 3.7* 3.9*   Microbiology No results found for this or any previous visit (from the past 240 hours).  Please note: You were cared for by a hospitalist during your hospital stay. Once you are discharged, your primary care physician will handle any further medical issues. Please note that NO REFILLS for any discharge medications will be authorized once you are discharged, as it is imperative that you return to your primary care physician (or establish a relationship with a primary care physician if you do not have one) for your post hospital discharge needs so that they can reassess your need for medications and monitor your lab values.    Time coordinating discharge: 40 minutes  SIGNED:   Leona Rake, MD  [facility] 02/04/2024, 10:19 AM Pager [phone]  If 7PM-7AM, please contact night-coverage [email or web address] Password [email or web address]

## 2024-02-04 NOTE — Progress Notes (Signed)
 Ordered 30 day monitor for evaluation post CVA. To be read by Dr. Emmette Harms

## 2024-02-04 NOTE — Progress Notes (Signed)
 PIV removed. Stroke book given to patient. AVS given to patient- Virtual RN Ethan reviewed discharge instructions with patient.

## 2024-02-04 NOTE — Progress Notes (Addendum)
 STROKE TEAM PROGRESS NOTE   INTERIM HISTORY/SUBJECTIVE  No one is at the bedside. Patient is awake and alert in NAD.  Patient states she is doing well.  She has not had no recurrent stroke or TIA symptoms. Platelet count is under 104000.  She has an appointment to see Dr. Sharalyn Dasen hematologist next week Echocardiogram showed normal ejection fraction.  Normal left atrial size. OBJECTIVE  CBC    Component Value Date/Time   WBC 3.9 (L) 02/03/2024 0400   RBC 4.22 02/03/2024 0400   HGB 12.2 02/03/2024 0400   HGB 13.5 11/08/2023 0940   HGB 13.3 08/11/2022 1002   HCT 35.9 (L) 02/03/2024 0400   HCT 38.3 08/11/2022 1002   PLT 104 (L) 02/03/2024 0400   PLT 163 11/08/2023 0940   PLT 73 (LL) 08/11/2022 1002   MCV 85.1 02/03/2024 0400   MCV 84 08/11/2022 1002   MCH 28.9 02/03/2024 0400   MCHC 34.0 02/03/2024 0400   RDW 13.6 02/03/2024 0400   RDW 12.4 08/11/2022 1002   LYMPHSABS 1.1 02/02/2024 0013   LYMPHSABS 0.9 08/11/2022 1002   MONOABS 0.4 02/02/2024 0013   EOSABS 0.2 02/02/2024 0013   EOSABS 0.1 08/11/2022 1002   BASOSABS 0.0 02/02/2024 0013   BASOSABS 0.0 08/11/2022 1002    BMET    Component Value Date/Time   NA 134 (L) 02/03/2024 0400   NA 139 02/13/2023 0943   K 3.5 02/03/2024 0400   CL 99 02/03/2024 0400   CO2 24 02/03/2024 0400   GLUCOSE 104 (H) 02/03/2024 0400   BUN 14 02/03/2024 0400   BUN 12 02/13/2023 0943   CREATININE 1.02 (H) 02/03/2024 0400   CREATININE 1.12 (H) 07/04/2023 0950   CREATININE 1.06 (H) 10/22/2017 0823   CALCIUM  9.6 02/03/2024 0400   EGFR 55 (L) 02/13/2023 0943   GFRNONAA >60 02/03/2024 0400   GFRNONAA 54 (L) 07/04/2023 0950    IMAGING past 24 hours ECHOCARDIOGRAM COMPLETE Result Date: 02/04/2024    ECHOCARDIOGRAM REPORT   Patient Name:   Carol Thomas Date of Exam: 02/04/2024 Medical Rec #:  [id number]      Height:       66.0 in Accession #:    [id number]     Weight:       202.8 lb Date of Birth:  [birth year]      BSA:          2.012 m Patient  Age:    67 years       BP:           127/71 mmHg Patient Gender: F              HR:           60 bpm. Exam Location:  Inpatient Procedure: 2D Echo, Cardiac Doppler and Color Doppler (Both Spectral and Color            Flow Doppler were utilized during procedure). Indications:    TIA G45.9  History:        Patient has no prior history of Echocardiogram examinations.                 Risk Factors:Hypertension.  Sonographer:    Astrid Blamer Referring Phys: [id number] ANDREW C CORE IMPRESSIONS  1. Left ventricular ejection fraction, by estimation, is 55 to 60%. The left ventricle has normal function. The left ventricle has no regional wall motion abnormalities. Left ventricular diastolic parameters are consistent with Grade I diastolic dysfunction (impaired relaxation).  2. Right ventricular systolic function is normal. The right ventricular size is normal. Tricuspid regurgitation signal is inadequate for assessing PA pressure.  3. The mitral valve is normal in structure. No evidence of mitral valve regurgitation. No evidence of mitral stenosis.  4. The aortic valve is tricuspid. There is mild calcification of the aortic valve. Aortic valve regurgitation is not visualized. No aortic stenosis is present.  5. IVC not visualized.  6. Technically difficult study so unable to estimate PA systolic pressure. FINDINGS  Left Ventricle: Left ventricular ejection fraction, by estimation, is 55 to 60%. The left ventricle has normal function. The left ventricle has no regional wall motion abnormalities. The left ventricular internal cavity size was normal in size. There is  no left ventricular hypertrophy. Left ventricular diastolic parameters are consistent with Grade I diastolic dysfunction (impaired relaxation). Right Ventricle: The right ventricular size is normal. No increase in right ventricular wall thickness. Right ventricular systolic function is normal. Tricuspid regurgitation signal is inadequate for assessing PA pressure.  Left Atrium: Left atrial size was normal in size. Right Atrium: Right atrial size was normal in size. Pericardium: Trivial pericardial effusion is present. Mitral Valve: The mitral valve is normal in structure. No evidence of mitral valve regurgitation. No evidence of mitral valve stenosis. Tricuspid Valve: The tricuspid valve is normal in structure. Tricuspid valve regurgitation is trivial. Aortic Valve: The aortic valve is tricuspid. There is mild calcification of the aortic valve. Aortic valve regurgitation is not visualized. No aortic stenosis is present. Aortic valve mean gradient measures 4.0 mmHg. Aortic valve peak gradient measures 7.7 mmHg. Aortic valve area, by VTI measures 2.44 cm. Pulmonic Valve: The pulmonic valve was normal in structure. Pulmonic valve regurgitation is not visualized. Aorta: The aortic root is normal in size and structure. Venous: IVC not visualized. IAS/Shunts: No atrial level shunt detected by color flow Doppler.  LEFT VENTRICLE PLAX 2D LVIDd:         4.50 cm   Diastology LVIDs:         3.00 cm   LV e' medial:    8.27 cm/s LV PW:         0.90 cm   LV E/e' medial:  8.2 LV IVS:        1.10 cm   LV e' lateral:   11.40 cm/s LVOT diam:     2.00 cm   LV E/e' lateral: 5.9 LV SV:         85 LV SV Index:   42 LVOT Area:     3.14 cm  RIGHT VENTRICLE RV S prime:     11.10 cm/s TAPSE (M-mode): 1.5 cm LEFT ATRIUM             Index        RIGHT ATRIUM           Index LA Vol (A2C):   35.9 ml 17.85 ml/m  RA Area:     12.80 cm LA Vol (A4C):   50.0 ml 24.85 ml/m  RA Volume:   27.90 ml  13.87 ml/m LA Biplane Vol: 42.3 ml 21.03 ml/m  AORTIC VALVE AV Area (Vmax):    2.33 cm AV Area (Vmean):   2.39 cm AV Area (VTI):     2.44 cm AV Vmax:           139.00 cm/s AV Vmean:          102.000 cm/s AV VTI:  0.347 m AV Peak Grad:      7.7 mmHg AV Mean Grad:      4.0 mmHg LVOT Vmax:         103.00 cm/s LVOT Vmean:        77.500 cm/s LVOT VTI:          0.269 m LVOT/AV VTI ratio: 0.78  AORTA Ao  Root diam: 2.90 cm MITRAL VALVE MV Area (PHT): 2.64 cm    SHUNTS MV Decel Time: 287 msec    Systemic VTI:  0.27 m MV E velocity: 67.60 cm/s  Systemic Diam: 2.00 cm MV A velocity: 80.70 cm/s MV E/A ratio:  0.84 Dalton McleanMD Electronically signed by Archer Bear Signature Date/Time: 02/04/2024/10:09:15 AM    Final     Vitals:   02/03/24 2024 02/04/24 0027 02/04/24 0444 02/04/24 0833  BP: (!) 157/78 130/81 127/71 (!) 146/80  Pulse: 74 72 70 61  Resp: 18   17  Temp: 97.9 F (36.6 C) 98.6 F (37 C) 98.4 F (36.9 C) 97.7 F (36.5 C)  TempSrc: Oral Oral Oral Oral  SpO2: 99% 96% 98% 97%  Weight:      Height:         PHYSICAL EXAM General:  Alert, well-nourished, well-developed pleasant middle-age Caucasian lady in no acute distress Psych:  Mood and affect appropriate for situation CV: Regular rate and rhythm on monitor Respiratory:  Regular, unlabored respirations on room air GI: Abdomen soft and nontender   NEURO:  Mental Status: AA&Ox3, patient is able to give clear and coherent history Speech/Language: speech is without dysarthria or aphasia.  Naming, repetition, fluency, and comprehension intact.  Cranial Nerves:  II: PERRL. Visual fields full.  III, IV, VI: EOMI. Eyelids elevate symmetrically.  V: Sensation is intact to light touch and symmetrical to face.  VII: Face is symmetrical resting and smiling VIII: hearing intact to voice. IX, X: Palate elevates symmetrically. Phonation is normal.  YQ:MVHQIONG shrug 5/5. XII: tongue is midline without fasciculations. Motor: 5/5 strength to all muscle groups tested.  Tone: is normal and bulk is normal Sensation- Intact to light touch bilaterally. Extinction absent to light touch to DSS.   Coordination: FTN intact bilaterally, HKS: no ataxia in BLE.No drift.  Gait- deferred  Most Recent NIH 0    ASSESSMENT/PLAN  Carol Thomas is a 68 y.o. female with history of HTN, ITP, adenomatous colon polyp (2010), anxiety,  melanoma to left leg (1998), eosinophilic esophagitis, osteopenia and vitamin D  deficiency who was initially seen earlier on Saturday for LLE weakness after her leg gave out during an episode of vertigo. NIH on Admission 0  Acute Ischemic Infarct: Acute left cerebellar infarct and subacute left parietal white matter infarct Etiology:  Small vessel disease  Code Stroke CT head No acute abnormality. ASPECTS 10.    MRI  Small acute left cerebellar infarct. Probably subacute small infarct in the left parieto-occipital region. MRA  Moderate Left PCA P1/P2 junction stenosis  2D Echo EF 55 to 60%.  Recommend 30 day heart monitor after discharge  LDL 76 HgbA1c 5.2 VTE prophylaxis - SCDs No antithrombotic prior to admission, now on aspirin  81 mg daily due to ITP  Therapy recommendations:  No follow up needed  Disposition:  Pending completion of echo  Hypertension Home meds:  Norvasc  10mg , losartan -hydrochorothiazide Stable Blood Pressure Goal: BP less than 220/110   Hyperlipidemia Home meds:  Crestor  10mg   LDL 76, goal < 70 Increase crestor  to 20mg  Continue statin at  discharge  Other Stroke Risk Factors Obesity, Body mass index is 32.74 kg/m., BMI >/= 30 associated with increased stroke risk, recommend weight loss, diet and exercise as appropriate   Other Active Problems ITP No plavix  Follows hematology  Hospital day # 1   Patient has presented with 2 transient episodes of left leg weakness with MRI scan showing small left cerebellar and left parietal white matter infarcts which likely cannot explain leg weakness and a silent infarcts.  The TTP with the low platelet count presents treatment challenges and is recommend only aspirin  81 mg daily but she will need close monitoring of her platelet count and we need to hold the aspirin  if counts below 50,000.  She will also need 30-day heart monitor at discharge to look for paroxysmal A-fib.  Follow-up as an outpatient stroke clinic in 2  months.  Keep scheduled appointment to see hematologist Dr. Scherrie Curt next week.  Long discussion with patient and husband and answered questions.  Discussed with Dr.Adhikari.  Greater than 50% time during this 35-minute visit was spent in counseling and coordination of care about TIA and small silent strokes and ITP and answering questions  Ardella Beaver, MD Medical Director [facility] Pager: [phone] 02/04/2024 2:34 PM   To contact Stroke Continuity provider, please refer to [email or web address]. After hours, contact General Neurology

## 2024-02-04 NOTE — Care Management Obs Status (Signed)
 MEDICARE OBSERVATION STATUS NOTIFICATION   Patient Details  Name: Carol Thomas MRN: [id number] Date of Birth: [birth year]   Medicare Observation Status Notification Given:  Yes    Jonathan Neighbor, RN 02/04/2024, 10:56 AM

## 2024-02-04 NOTE — TOC Transition Note (Signed)
 Transition of Care [facility]) - Discharge Note   Patient Details  Name: TYREONA PANJWANI MRN: [id number] Date of Birth: March 27, 1956  Transition of Care [facility]) CM/SW Contact:  Jonathan Neighbor, RN Phone Number: 02/04/2024, 11:04 AM   Clinical Narrative:     Pt is discharging home with self care. No follow up needs per therapy.  Pt has transportation home.  Final next level of care: Home/Self Care Barriers to Discharge: No Barriers Identified   Patient Goals and CMS Choice            Discharge Placement                       Discharge Plan and Services Additional resources added to the After Visit Summary for                                       Social Drivers of Health (SDOH) Interventions SDOH Screenings   Food Insecurity: No Food Insecurity (02/03/2024)  Housing: Low Risk  (02/03/2024)  Transportation Needs: No Transportation Needs (02/03/2024)  Utilities: Not At Risk (02/03/2024)  Depression (PHQ2-9): Medium Risk (08/27/2023)  Social Connections: Moderately Isolated (02/03/2024)  Tobacco Use: Low Risk  (02/02/2024)     Readmission Risk Interventions     No data to display

## 2024-02-05 ENCOUNTER — Other Ambulatory Visit: Payer: Self-pay | Admitting: Family Medicine

## 2024-02-05 DIAGNOSIS — I10 Essential (primary) hypertension: Secondary | ICD-10-CM

## 2024-02-05 NOTE — Progress Notes (Unsigned)
 No chief complaint on file.  Patient presents for hospital follow-up.  UPDATE SON ***  She was hospitalized 4/19-4/21/25. The patient initially presented to the ER with multiple episodes of vertigo and "left lower extremity giving out". CT was normal, patient discharged.  She went to [facility] (immediately adjacent to [facility]) and subsequently had recurrent symptoms, sent to [facility] for MRI of the brain and lumbar spine.   MRI of the brain showed small acute left cerebellar infarct, subacute small infarct of the left occipital region.   Echo showed EF of 55 to 60%, no intracardiac shunt.  PT/OT/speech consulted, no follow-up recommended. A1c 5.2.  LDL of 76.  Crestor  dose increased to 20mg .  Neurology recommended 30-day event monitor to look for paroxysmal afib, ordered (per d/c summary, they sent message to cardiology.) She was started on aspirin  81 mg daily (no plavix  or other meds due to her TTP).  Patient instructed to hold aspirin  if her platelets level falls less than 50,000.  She has f/u scheduled with Dr. Scherrie Curt.   Patient usually takes amlodipine , losartan -HCT for hypertension.  Her BP's weren't high, and they recommended holding the losartan -HCT, continuing only with the amlodipine  on discharge.  Instructed to monitor BP's at home, and to resume the losartan -HCT if BP trended back up.  BP Readings from Last 3 Encounters:  02/04/24 (!) 146/80  02/02/24 (!) 149/79  11/08/23 127/85    Her rosuvastatin  was changed to 20 mg dose upon discharge.    Lab Results  Component Value Date   CHOL 151 02/03/2024   HDL 63 02/03/2024   LDLCALC 76 02/03/2024   TRIG 58 02/03/2024   CHOLHDL 2.4 02/03/2024   TSH was noted to be elevated in the hospital. She denies changes to hair/skin/bowels.  Lab Results  Component Value Date   TSH 7.257 (H) 02/03/2024      PMH, PSH, SH reviewed  FHx updated ***   All discharge mediations have been reviewed and reconciled with  the most current medication list. MCKENZYE CUTRIGHT has their current medications, including OTC medications listed in the chart. Any changes to the medications from today's visit will be noted in the assessment and plan section of this note.   ROS:     PHYSICAL EXAM:  There were no vitals taken for this visit.  Wt Readings from Last 3 Encounters:  02/03/24 202 lb 13.2 oz (92 kg)  02/02/24 199 lb 4.7 oz (90.4 kg)  11/08/23 199 lb 6.4 oz (90.4 kg)        ASSESSMENT/PLAN:   Looks like they ordered event monitor from the hospital--make sure she has either gotten this or knows how/where to (?)--note says through cardiology, and that they sent message.  Will need lipids repeated in 2 mos, along with LFTs and TSH Currently scheduled for 6 month med check with me in late May--can be moved to 2 mos (since they did A1c in hospital and was fine), and have labs done prior to that. Please enter future orders, and we can have them change appt to 2 mos with fasting labs prior. Won't do CBC, since Dr. Scherrie Curt orders/manages that.  Amlodipine  was denied yesterday. She should only have enough until 5/11, her next appt for med check is after that. Okay to refill #90 of amlodipine  (did she request the RF?), which should last until the r/s med check.   Scheduled to see neuro in f/u on 5/19 Has appt with Dr. Scherrie Curt 4/24.

## 2024-02-06 ENCOUNTER — Encounter: Payer: Self-pay | Admitting: Family Medicine

## 2024-02-06 ENCOUNTER — Other Ambulatory Visit: Payer: TRICARE For Life (TFL)

## 2024-02-06 ENCOUNTER — Other Ambulatory Visit: Payer: Self-pay | Admitting: *Deleted

## 2024-02-06 ENCOUNTER — Ambulatory Visit (INDEPENDENT_AMBULATORY_CARE_PROVIDER_SITE_OTHER): Admitting: Family Medicine

## 2024-02-06 VITALS — BP 140/88 | HR 80 | Ht 66.0 in | Wt 200.2 lb

## 2024-02-06 DIAGNOSIS — I639 Cerebral infarction, unspecified: Secondary | ICD-10-CM

## 2024-02-06 DIAGNOSIS — I10 Essential (primary) hypertension: Secondary | ICD-10-CM

## 2024-02-06 DIAGNOSIS — R7989 Other specified abnormal findings of blood chemistry: Secondary | ICD-10-CM

## 2024-02-06 DIAGNOSIS — D693 Immune thrombocytopenic purpura: Secondary | ICD-10-CM

## 2024-02-06 DIAGNOSIS — R7301 Impaired fasting glucose: Secondary | ICD-10-CM

## 2024-02-06 DIAGNOSIS — E7800 Pure hypercholesterolemia, unspecified: Secondary | ICD-10-CM | POA: Diagnosis not present

## 2024-02-06 DIAGNOSIS — F411 Generalized anxiety disorder: Secondary | ICD-10-CM

## 2024-02-06 DIAGNOSIS — Z5181 Encounter for therapeutic drug level monitoring: Secondary | ICD-10-CM

## 2024-02-06 MED ORDER — ROSUVASTATIN CALCIUM 20 MG PO TABS: 20.0000 mg | ORAL_TABLET | Freq: Every day | ORAL | 0 refills | Status: DC

## 2024-02-06 NOTE — Patient Instructions (Addendum)
 Be sure to monitor your blood pressure regularly at home, since the losartan  HCT was stopped. My guess is that at some point your pressures will be high, and we will need to restart it. Please follow-up if if/when blood pressures are consistently 150 systolic or higher.  Let us  know if you have any symptoms of underactive thyroid that are bothersome (weight gain, constipation, brittle nails, hair loss, feeling cold, depression, etc)--if so, we can recheck your thyroid test sooner than June.

## 2024-02-07 ENCOUNTER — Inpatient Hospital Stay: Attending: Oncology

## 2024-02-07 ENCOUNTER — Encounter: Payer: Self-pay | Admitting: Oncology

## 2024-02-07 ENCOUNTER — Telehealth: Payer: Self-pay

## 2024-02-07 DIAGNOSIS — D693 Immune thrombocytopenic purpura: Secondary | ICD-10-CM | POA: Diagnosis present

## 2024-02-07 DIAGNOSIS — D696 Thrombocytopenia, unspecified: Secondary | ICD-10-CM

## 2024-02-07 LAB — CBC WITH DIFFERENTIAL (CANCER CENTER ONLY)
Abs Immature Granulocytes: 0.01 10*3/uL (ref 0.00–0.07)
Basophils Absolute: 0 10*3/uL (ref 0.0–0.1)
Basophils Relative: 1 %
Eosinophils Absolute: 0.2 10*3/uL (ref 0.0–0.5)
Eosinophils Relative: 4 %
HCT: 36.9 % (ref 36.0–46.0)
Hemoglobin: 12.8 g/dL (ref 12.0–15.0)
Immature Granulocytes: 0 %
Lymphocytes Relative: 21 %
Lymphs Abs: 0.7 10*3/uL (ref 0.7–4.0)
MCH: 29 pg (ref 26.0–34.0)
MCHC: 34.7 g/dL (ref 30.0–36.0)
MCV: 83.5 fL (ref 80.0–100.0)
Monocytes Absolute: 0.2 10*3/uL (ref 0.1–1.0)
Monocytes Relative: 6 %
Neutro Abs: 2.3 10*3/uL (ref 1.7–7.7)
Neutrophils Relative %: 68 %
Platelet Count: 95 10*3/uL — ABNORMAL LOW (ref 150–400)
RBC: 4.42 MIL/uL (ref 3.87–5.11)
RDW: 13.4 % (ref 11.5–15.5)
WBC Count: 3.5 10*3/uL — ABNORMAL LOW (ref 4.0–10.5)
nRBC: 0 % (ref 0.0–0.2)

## 2024-02-07 NOTE — Telephone Encounter (Signed)
-----   Message from Coni Deep sent at 02/07/2024  2:00 PM EDT ----- Please call patient, the platelet count is stable, call for bleeding or spontaneous bruising, follow-up as scheduled

## 2024-02-07 NOTE — Telephone Encounter (Signed)
 Spoke with patient in regards to platelet count results,voiced concern about levels decreasing since January and is inquiring to speak with the provider. Per patient " Shelah Derry to the Emergency room on 02/02/24 due to having two mini strokes, given plavix  one time dose, aspirin  81mg s currently still taking, admitted to [facility], Cardiology initiated wearing a heart monitor for 4 weeks to rule out A-Fib, will receive in the mail 3-5 days. Neurology concerned on how to treat/ move forward due to ITP." Made aware would let Dr. Scherrie Curt know and will follow up,understood.  Followed-up with patient, specialists will need to contact Dr. Scherrie Curt to discuss concerns. Understood, no further questions.

## 2024-02-11 ENCOUNTER — Other Ambulatory Visit: Payer: Self-pay | Admitting: Family Medicine

## 2024-02-11 DIAGNOSIS — I10 Essential (primary) hypertension: Secondary | ICD-10-CM

## 2024-02-20 ENCOUNTER — Telehealth: Payer: Self-pay | Admitting: Neurology

## 2024-02-20 NOTE — Telephone Encounter (Signed)
 LVM and sent mychart msg informing pt of need to reschedule 03/03/24 appt - NP out

## 2024-02-21 ENCOUNTER — Inpatient Hospital Stay: Attending: Oncology

## 2024-02-21 ENCOUNTER — Other Ambulatory Visit: Payer: Self-pay | Admitting: *Deleted

## 2024-02-21 ENCOUNTER — Encounter: Payer: Self-pay | Admitting: Oncology

## 2024-02-21 ENCOUNTER — Telehealth: Payer: Self-pay | Admitting: *Deleted

## 2024-02-21 DIAGNOSIS — D696 Thrombocytopenia, unspecified: Secondary | ICD-10-CM

## 2024-02-21 DIAGNOSIS — D693 Immune thrombocytopenic purpura: Secondary | ICD-10-CM | POA: Diagnosis present

## 2024-02-21 LAB — CBC WITH DIFFERENTIAL (CANCER CENTER ONLY)
Abs Immature Granulocytes: 0.01 10*3/uL (ref 0.00–0.07)
Basophils Absolute: 0 10*3/uL (ref 0.0–0.1)
Basophils Relative: 1 %
Eosinophils Absolute: 0.1 10*3/uL (ref 0.0–0.5)
Eosinophils Relative: 3 %
HCT: 35.5 % — ABNORMAL LOW (ref 36.0–46.0)
Hemoglobin: 12.4 g/dL (ref 12.0–15.0)
Immature Granulocytes: 0 %
Lymphocytes Relative: 20 %
Lymphs Abs: 0.8 10*3/uL (ref 0.7–4.0)
MCH: 29.1 pg (ref 26.0–34.0)
MCHC: 34.9 g/dL (ref 30.0–36.0)
MCV: 83.3 fL (ref 80.0–100.0)
Monocytes Absolute: 0.3 10*3/uL (ref 0.1–1.0)
Monocytes Relative: 7 %
Neutro Abs: 2.7 10*3/uL (ref 1.7–7.7)
Neutrophils Relative %: 69 %
Platelet Count: 66 10*3/uL — ABNORMAL LOW (ref 150–400)
RBC: 4.26 MIL/uL (ref 3.87–5.11)
RDW: 13.3 % (ref 11.5–15.5)
WBC Count: 4 10*3/uL (ref 4.0–10.5)
nRBC: 0 % (ref 0.0–0.2)

## 2024-02-21 NOTE — Telephone Encounter (Signed)
 CBC results today reviewed by Dr. Scherrie Curt. OK to recheck in 1 month, but Carol Thomas wants recheck in 1 week and this was scheduled. She will continue her ASA 81 mg daily unless platelets drop to 50,000. Started this on hospital d/c due to ischemic stroke in April. She is quite tearful and anxious today.

## 2024-02-21 NOTE — Progress Notes (Signed)
 Patient has unexpected bruise on her knee with not other s/s bleeding. She wants lab checked. Scheduling notified for lab today or tomorrow.

## 2024-02-24 ENCOUNTER — Encounter: Payer: Self-pay | Admitting: Family Medicine

## 2024-02-25 ENCOUNTER — Encounter: Payer: Medicare Other | Admitting: Family Medicine

## 2024-02-25 NOTE — Progress Notes (Unsigned)
 No chief complaint on file.    Thrombocytopenia--platelet level dropped to 66 last week.  Dr. Scherrie Curt was recommending recheck in a month, but patient wanted it done sooner due to being on aspirin .  Plan is to stay on aspirin  81 mg daily unless platelets drop to <50K. She is on aspirin  due to stroke in April.    She denies bleeding.  Component Ref Range & Units (hover) 4 d ago (02/21/24) 2 wk ago (02/07/24) 3 wk ago (02/03/24) 3 wk ago (02/02/24) 3 wk ago (02/02/24) 3 wk ago (02/02/24) 3 mo ago (11/08/23)  WBC Count 4.0 3.5 Low  3.9 Low  3.7 Low   4.2 3.7 Low   RBC 4.26 4.42 4.22 4.52  4.40 4.68  Hemoglobin 12.4 12.8 12.2 13.0  12.6 13.5  HCT 35.5 Low  36.9 35.9 Low  37.6  37.4 39.4  MCV 83.3 83.5 85.1 83.2  85.0 84.2  MCH 29.1 29.0 28.9 28.8  28.6 28.8  MCHC 34.9 34.7 34.0 34.6  33.7 34.3  RDW 13.3 13.4 13.6 13.5  13.5 12.7  Platelet Count 66 Low  95 Low  104 Low  CM 109 Low  CM  102 Low  163      PMH, PSH, SH reviewed   ROS:    PHYSICAL EXAM:  There were no vitals taken for this visit.  Wt Readings from Last 3 Encounters:  02/06/24 200 lb 3.2 oz (90.8 kg)  02/03/24 202 lb 13.2 oz (92 kg)  02/02/24 199 lb 4.7 oz (90.4 kg)       ASSESSMENT/PLAN:   She has future orders from Dr. Scherrie Curt for plts--is she going to want this today vs getting from them as planned??

## 2024-02-26 ENCOUNTER — Encounter: Payer: Self-pay | Admitting: Family Medicine

## 2024-02-26 ENCOUNTER — Ambulatory Visit (INDEPENDENT_AMBULATORY_CARE_PROVIDER_SITE_OTHER): Admitting: Family Medicine

## 2024-02-26 VITALS — BP 140/80 | HR 80 | Temp 97.9°F | Ht 66.0 in | Wt 198.0 lb

## 2024-02-26 DIAGNOSIS — D693 Immune thrombocytopenic purpura: Secondary | ICD-10-CM | POA: Diagnosis not present

## 2024-02-26 DIAGNOSIS — R21 Rash and other nonspecific skin eruption: Secondary | ICD-10-CM | POA: Diagnosis not present

## 2024-02-26 DIAGNOSIS — L03116 Cellulitis of left lower limb: Secondary | ICD-10-CM | POA: Diagnosis not present

## 2024-02-26 MED ORDER — DOXYCYCLINE HYCLATE 100 MG PO TABS: 100.0000 mg | ORAL_TABLET | Freq: Two times a day (BID) | ORAL | 0 refills | Status: DC

## 2024-02-27 ENCOUNTER — Encounter: Payer: Self-pay | Admitting: Oncology

## 2024-02-27 ENCOUNTER — Ambulatory Visit: Payer: Self-pay | Admitting: Family Medicine

## 2024-02-27 LAB — CBC WITH DIFFERENTIAL/PLATELET
Basophils Absolute: 0 10*3/uL (ref 0.0–0.2)
Basos: 1 %
EOS (ABSOLUTE): 0.1 10*3/uL (ref 0.0–0.4)
Eos: 4 %
Hematocrit: 38.2 % (ref 34.0–46.6)
Hemoglobin: 12.8 g/dL (ref 11.1–15.9)
Immature Grans (Abs): 0 10*3/uL (ref 0.0–0.1)
Immature Granulocytes: 0 %
Lymphocytes Absolute: 1 10*3/uL (ref 0.7–3.1)
Lymphs: 26 %
MCH: 28.8 pg (ref 26.6–33.0)
MCHC: 33.5 g/dL (ref 31.5–35.7)
MCV: 86 fL (ref 79–97)
Monocytes Absolute: 0.3 10*3/uL (ref 0.1–0.9)
Monocytes: 8 %
Neutrophils Absolute: 2.3 10*3/uL (ref 1.4–7.0)
Neutrophils: 61 %
Platelets: 53 10*3/uL — CL (ref 150–450)
RBC: 4.45 x10E6/uL (ref 3.77–5.28)
RDW: 13.6 % (ref 11.7–15.4)
WBC: 3.8 10*3/uL (ref 3.4–10.8)

## 2024-02-28 ENCOUNTER — Telehealth: Payer: Self-pay | Admitting: *Deleted

## 2024-02-28 ENCOUNTER — Inpatient Hospital Stay

## 2024-02-28 DIAGNOSIS — D693 Immune thrombocytopenic purpura: Secondary | ICD-10-CM | POA: Diagnosis not present

## 2024-02-28 DIAGNOSIS — D696 Thrombocytopenia, unspecified: Secondary | ICD-10-CM

## 2024-02-28 LAB — CBC WITH DIFFERENTIAL (CANCER CENTER ONLY)
Abs Immature Granulocytes: 0.01 10*3/uL (ref 0.00–0.07)
Basophils Absolute: 0 10*3/uL (ref 0.0–0.1)
Basophils Relative: 1 %
Eosinophils Absolute: 0.2 10*3/uL (ref 0.0–0.5)
Eosinophils Relative: 4 %
HCT: 37.5 % (ref 36.0–46.0)
Hemoglobin: 13 g/dL (ref 12.0–15.0)
Immature Granulocytes: 0 %
Lymphocytes Relative: 24 %
Lymphs Abs: 1 10*3/uL (ref 0.7–4.0)
MCH: 29 pg (ref 26.0–34.0)
MCHC: 34.7 g/dL (ref 30.0–36.0)
MCV: 83.7 fL (ref 80.0–100.0)
Monocytes Absolute: 0.3 10*3/uL (ref 0.1–1.0)
Monocytes Relative: 7 %
Neutro Abs: 2.8 10*3/uL (ref 1.7–7.7)
Neutrophils Relative %: 64 %
Platelet Count: 62 10*3/uL — ABNORMAL LOW (ref 150–400)
RBC: 4.48 MIL/uL (ref 3.87–5.11)
RDW: 13.3 % (ref 11.5–15.5)
WBC Count: 4.3 10*3/uL (ref 4.0–10.5)
nRBC: 0 % (ref 0.0–0.2)

## 2024-02-28 NOTE — Telephone Encounter (Addendum)
 Reviewed CBC results w/patient today and platelet is higher at 62,000 today. She reports the bruise of her knee has increased in size (she has marked it in ink--to this RN it is minimally increased). No other bleeding. Still on ASA 81 mg daily. Next appointment is 7/24 for lab/OV.  Per Dr. Scherrie Curt: Lab is stable. OK to wait for lab/OV in July unless patient is anxious and wants to check in a month. Call for bleeding or increased bruising or petechia. Carol Thomas made aware and agrees, but will call if she needs us  sooner.

## 2024-03-03 ENCOUNTER — Encounter: Payer: Medicare Other | Admitting: Family Medicine

## 2024-03-03 ENCOUNTER — Inpatient Hospital Stay: Admitting: Neurology

## 2024-03-12 ENCOUNTER — Encounter: Payer: Medicare Other | Admitting: Family Medicine

## 2024-03-12 ENCOUNTER — Ambulatory Visit: Attending: Cardiology

## 2024-03-12 DIAGNOSIS — I639 Cerebral infarction, unspecified: Secondary | ICD-10-CM

## 2024-03-18 ENCOUNTER — Encounter: Payer: Self-pay | Admitting: Neurology

## 2024-03-18 ENCOUNTER — Ambulatory Visit (INDEPENDENT_AMBULATORY_CARE_PROVIDER_SITE_OTHER): Admitting: Neurology

## 2024-03-18 VITALS — BP 158/82 | HR 73 | Ht 66.0 in | Wt 195.5 lb

## 2024-03-18 DIAGNOSIS — I10 Essential (primary) hypertension: Secondary | ICD-10-CM | POA: Diagnosis not present

## 2024-03-18 DIAGNOSIS — Z862 Personal history of diseases of the blood and blood-forming organs and certain disorders involving the immune mechanism: Secondary | ICD-10-CM | POA: Diagnosis not present

## 2024-03-18 DIAGNOSIS — D696 Thrombocytopenia, unspecified: Secondary | ICD-10-CM

## 2024-03-18 DIAGNOSIS — I639 Cerebral infarction, unspecified: Secondary | ICD-10-CM | POA: Diagnosis not present

## 2024-03-18 NOTE — Patient Instructions (Addendum)
 Continue aspirin  81 mg daily for secondary stroke prevention  Strict management of vascular risk factors with a goal BP less than 130/90, A1c less than 7.0, LDL less than 70 for secondary stroke prevention Will check your platelet count today for baseline Follow up on results of cardiac monitor study Recommend exercise, healthy eating Follow up as needed

## 2024-03-18 NOTE — Progress Notes (Signed)
 Patient: Carol Thomas Date of Birth: March 20, 1956  Reason for Visit: Stroke Clinic Follow Up  History from: Patient, husband  Primary Neurologist: Janett Medin   ASSESSMENT AND PLAN 68 y.o. year old female with acute left cerebellar infarct and subacute left parietal white matter infarct.  Etiology small vessel disease. CVAs likely do not explain left leg weakness and silent infarcts.  History of ITP, follows with hematology, no Plavix , on aspirin  81 mg daily, platelets have dropped to 62.  Vascular risk factors: HTN, HLD. Under significant stress, her son passed away unexpectedly, now has custody of 2 grand kids.   - Has completed cardiac monitor, is awaiting results - Only aspirin  81 mg daily due to history of ITP, last week platelet count 62, patient traveling end of the week, would like rechecked, order CBC  -Strict management of vascular risk factors with a goal BP less than 130/90, A1c less than 7.0, LDL less than 70 for secondary stroke prevention - Recommend continue exercise, healthy eating - Keep close follow-up with hematology Dr. Scherrie Curt  - She has an upcoming trip planned end of the week by air travel, typically recommend waiting 3 months, but desires to proceed with caution, there will be emergency services nearby - We had a long discussion about her recent silent stroke, they will follow up in 6-8 months with Dr.Sethi since her case is further complicated by ITP  HISTORY OF PRESENT ILLNESS: Today 03/18/24 Has been following with hematology for ITP. 02/28/24 platelet was 62. Had a rash to her knee. Plan to remain on aspirin  unless platelets drop below 50.  Completed cardiac monitor, awaiting results. Rash to left knee has cleared, after antibiotic. Bruises easily at baseline. Has fatigue, multifactorial with ITP and esophagitis. Has noted a few mild headaches thinks are sinus related to left face side of face above her eye.  Has not needed to take anything. Has not noted any vision  change, sees floaters at baseline. No weakness on the left leg. She drives is independent. Lives with her husband. She is going to [place]  end of this week, anxious about travel with ITP, would like CBC checked. BP today 158/82, reports always up in office, running in the 130's/80's, she was taken off Losartan  due to BP well maintained. Her son passed away suddenly in 67/12/2023, they now have custody of her 2 grandsons 44 and 63.   HISTORY  Presented to the ER 02/02/24 for left leg weakness after her leg gave out during an episode of vertigo.  Her workup was unremarkable and she was discharged home.  She returned later that night with recurrence of left leg weakness when trying to get up and her leg gave out.  Found to have acute left cerebellar infarct and subacute left parietal white matter infarct.  Etiology small vessel disease. Dr. Janett Medin felt CVA likely cannot explain leg weakness and silent infarcts.  History of ITP, no Plavix , started on aspirin  81 mg daily. Platelet 104. Needs cardiac monitor to look for AFIB.   -Code stroke CT head no acute abnormality. - MRI of the brain small acute left cerebellar infarct.  Probably subacute small infarct in left parieto-occipital region - MRA moderate left PCA P1/P2 junction stenosis - 2D echo EF 55 to 60%.  Recommended 30-day cardiac monitor. - LDL 76, increased Crestor  to 20 mg daily  - A1c 5.2 - No antithrombotic prior to admission, placed on aspirin  81 mg daily due to ITP  REVIEW OF SYSTEMS: Out of a  complete 14 system review of symptoms, the patient complains only of the following symptoms, and all other reviewed systems are negative.  See HPI  ALLERGIES: Allergies  Allergen Reactions   Other Other (See Comments)    Patient has Eosinophilic Esophagitis = Heartburn/acid reflux Eosinophilic esophagitis (EoE) is an inflammation of the esophagus (the tube connecting the mouth to the stomach), caused by a specific white blood cell - the eosinophil. NO  eggs, juices, hot dogs, milk, cucumbers, etc......    HOME MEDICATIONS: Outpatient Medications Prior to Visit  Medication Sig Dispense Refill   acetaminophen  (TYLENOL ) 500 MG tablet Take 1,000 mg by mouth every 6 (six) hours as needed for mild pain (pain score 1-3), fever or headache.     alendronate  (FOSAMAX ) 70 MG tablet Take 1 tablet (70 mg total) by mouth every 7 (seven) days. Take with a full glass of water on an empty stomach. 4 tablet 0   amLODipine  (NORVASC ) 10 MG tablet Take 1 tablet (10 mg total) by mouth daily. 90 tablet 1   aspirin  EC 81 MG tablet Take 1 tablet (81 mg total) by mouth daily. Swallow whole. 60 tablet 0   Cholecalciferol  (VITAMIN D3) 50 MCG (2000 UT) TABS Take 2,000 Units by mouth in the morning.     citalopram  (CELEXA ) 20 MG tablet Take 1 tablet (20 mg total) by mouth daily. 90 tablet 3   co-enzyme Q-10 30 MG capsule Take 30 mg by mouth daily.     pantoprazole  (PROTONIX ) 40 MG tablet Take 40 mg by mouth daily.     rosuvastatin  (CRESTOR ) 20 MG tablet Take 1 tablet (20 mg total) by mouth daily. 60 tablet 0   SYSTANE COMPLETE PF 0.6 % SOLN Place 1 drop into both eyes 3 (three) times daily as needed (for dryness or irritation).     doxycycline  (VIBRA -TABS) 100 MG tablet Take 1 tablet (100 mg total) by mouth 2 (two) times daily. 20 tablet 0   No facility-administered medications prior to visit.    PAST MEDICAL HISTORY: Past Medical History:  Diagnosis Date   Adenomatous colon polyp 11/16/2008   Dr. Elsie Halo at [place]   Anxiety    Cancer [facility]) 10/16/1996   Melanoma-Left leg   Hypertension    Idiopathic thrombocytopenic purpura (ITP) (HCC)    Osteopenia 10/16/2012   T score of -2.2 FRAX 8%/1.1%   Vitamin D  deficiency 10/16/2012   vitamin D  level 10    PAST SURGICAL HISTORY: Past Surgical History:  Procedure Laterality Date   APPENDECTOMY     ESOPHAGOGASTRODUODENOSCOPY  05/24/2017   Erythema in the middle third of the esphagus and in lower third.-biopised  erythematous mucosa in the antrum, normal examined duodenum   Melanoma excised     TOTAL VAGINAL HYSTERECTOMY  1997   TAH   TUBAL LIGATION     UPPER GI ENDOSCOPY  04/03/2022   Dr. Tomas Fountain gastritis, possible eosinophilic esophagitis    FAMILY HISTORY: Family History  Problem Relation Age of Onset   Hypertension Mother    Diabetes Mother    Osteoporosis Mother    Thyroid  disease Mother    COPD Mother    Hyperlipidemia Mother    Heart attack Mother 51   Anxiety disorder Mother    Arthritis Mother    Heart disease Mother    Kidney disease Mother    Hyperlipidemia Sister    Stroke Daughter        during pregnancy   Depression Daughter    Bipolar disorder Son  Colon cancer Maternal Uncle        40's    SOCIAL HISTORY: Social History   Socioeconomic History   Marital status: Married    Spouse name: Not on file   Number of children: 2   Years of education: Not on file   Highest education level: Not on file  Occupational History   Occupation: [employer] features editor)  Tobacco Use   Smoking status: Never   Smokeless tobacco: Never  Substance and Sexual Activity   Alcohol use: No   Drug use: No   Sexual activity: Not Currently    Birth control/protection: Surgical  Other Topics Concern   Not on file  Social History Narrative   Married.  Lives with husband, 1 dog, 1 cat.   Son and 2 grandsons (10 and 46) are currently living with her (their mom passed suddenly, MI at 23)).   Mother-in-law has vascular dementia, and is now in a nursing home.      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         Updated 08/2023   Social Drivers of Health   Financial Resource Strain: Not on file  Food Insecurity: No Food Insecurity (02/03/2024)   Hunger Vital Sign    Worried About Running Out of Food in the Last Year: Never true    Ran Out of Food in the Last Year: Never true  Transportation Needs: No Transportation  Needs (02/03/2024)   PRAPARE - Administrator, Civil Service (Medical): No    Lack of Transportation (Non-Medical): No  Physical Activity: Not on file  Stress: Not on file  Social Connections: Moderately Isolated (02/03/2024)   Social Connection and Isolation Panel [NHANES]    Frequency of Communication with Friends and Family: Twice a week    Frequency of Social Gatherings with Friends and Family: Once a week    Attends Religious Services: Never    Database administrator or Organizations: No    Attends Banker Meetings: Never    Marital Status: Married  Catering manager Violence: Not At Risk (02/03/2024)   Humiliation, Afraid, Rape, and Kick questionnaire    Fear of Current or Ex-Partner: No    Emotionally Abused: No    Physically Abused: No    Sexually Abused: No   PHYSICAL EXAM  Vitals:   03/18/24 0912 03/18/24 0917  BP: (!) 152/80 (!) 158/82  Pulse: 73   SpO2: 98%   Weight: 195 lb 8 oz (88.7 kg)   Height: 5\' 6"  (1.676 m)    Body mass index is 31.55 kg/m.  Generalized: Well developed, in no acute distress  Neurological examination  Mentation: Alert oriented to time, place, history taking. Follows all commands speech and language fluent Cranial nerve II-XII: Pupils were equal round reactive to light. Extraocular movements were full, visual field were full on confrontational test. Facial sensation and strength were normal.  Head turning and shoulder shrug  were normal and symmetric. Motor: The motor testing reveals 5 over 5 strength of all 4 extremities. Good symmetric motor tone is noted throughout.  Sensory: Sensory testing is intact to soft touch on all 4 extremities. No evidence of extinction is noted.  Coordination: Cerebellar testing reveals good finger-nose-finger and heel-to-shin bilaterally.  Gait and station: Gait is normal. Tandem gait is unsteady. Reflexes: Deep tendon reflexes are symmetric and normal bilaterally.   DIAGNOSTIC DATA (LABS,  IMAGING, TESTING) - I reviewed patient records,  labs, notes, testing and imaging myself where available.  Lab Results  Component Value Date   WBC 4.3 02/28/2024   HGB 13.0 02/28/2024   HCT 37.5 02/28/2024   MCV 83.7 02/28/2024   PLT 62 (L) 02/28/2024      Component Value Date/Time   NA 134 (L) 02/03/2024 0400   NA 139 02/13/2023 0943   K 3.5 02/03/2024 0400   CL 99 02/03/2024 0400   CO2 24 02/03/2024 0400   GLUCOSE 104 (H) 02/03/2024 0400   BUN 14 02/03/2024 0400   BUN 12 02/13/2023 0943   CREATININE 1.02 (H) 02/03/2024 0400   CREATININE 1.12 (H) 07/04/2023 0950   CREATININE 1.06 (H) 10/22/2017 0823   CALCIUM  9.6 02/03/2024 0400   PROT 6.4 (L) 02/03/2024 0400   PROT 6.4 02/13/2023 0943   ALBUMIN 3.7 02/03/2024 0400   ALBUMIN 4.3 02/13/2023 0943   AST 23 02/03/2024 0400   AST 14 (L) 07/04/2023 0950   ALT 24 02/03/2024 0400   ALT 14 07/04/2023 0950   ALKPHOS 64 02/03/2024 0400   BILITOT 1.0 02/03/2024 0400   BILITOT 1.0 07/04/2023 0950   GFRNONAA >60 02/03/2024 0400   GFRNONAA 54 (L) 07/04/2023 0950   GFRAA 72 07/28/2020 0843   Lab Results  Component Value Date   CHOL 151 02/03/2024   HDL 63 02/03/2024   LDLCALC 76 02/03/2024   TRIG 58 02/03/2024   CHOLHDL 2.4 02/03/2024   Lab Results  Component Value Date   HGBA1C 5.2 02/03/2024   Lab Results  Component Value Date   VITAMINB12 214 08/23/2022   Lab Results  Component Value Date   TSH 7.257 (H) 02/03/2024   Jeanmarie Millet, AGNP-C, DNP 03/18/2024, 9:22 AM [facility] [address] [place], [place] [address] [phone]

## 2024-03-19 ENCOUNTER — Ambulatory Visit: Payer: Self-pay | Admitting: Neurology

## 2024-03-19 LAB — CBC WITH DIFFERENTIAL/PLATELET
Basophils Absolute: 0 10*3/uL (ref 0.0–0.2)
Basos: 1 %
EOS (ABSOLUTE): 0.3 10*3/uL (ref 0.0–0.4)
Eos: 6 %
Hematocrit: 38.9 % (ref 34.0–46.6)
Hemoglobin: 12.9 g/dL (ref 11.1–15.9)
Immature Grans (Abs): 0 10*3/uL (ref 0.0–0.1)
Immature Granulocytes: 0 %
Lymphocytes Absolute: 0.9 10*3/uL (ref 0.7–3.1)
Lymphs: 21 %
MCH: 28.9 pg (ref 26.6–33.0)
MCHC: 33.2 g/dL (ref 31.5–35.7)
MCV: 87 fL (ref 79–97)
Monocytes Absolute: 0.3 10*3/uL (ref 0.1–0.9)
Monocytes: 7 %
Neutrophils Absolute: 2.7 10*3/uL (ref 1.4–7.0)
Neutrophils: 65 %
Platelets: 101 10*3/uL — ABNORMAL LOW (ref 150–450)
RBC: 4.46 x10E6/uL (ref 3.77–5.28)
RDW: 13.1 % (ref 11.7–15.4)
WBC: 4.1 10*3/uL (ref 3.4–10.8)

## 2024-03-21 NOTE — Progress Notes (Signed)
 I agree with the above plan

## 2024-03-25 DIAGNOSIS — I639 Cerebral infarction, unspecified: Secondary | ICD-10-CM

## 2024-03-28 ENCOUNTER — Ambulatory Visit: Payer: Self-pay | Admitting: Cardiology

## 2024-03-30 ENCOUNTER — Encounter: Payer: Self-pay | Admitting: Family Medicine

## 2024-03-31 ENCOUNTER — Encounter: Payer: Self-pay | Admitting: Family Medicine

## 2024-03-31 ENCOUNTER — Ambulatory Visit (INDEPENDENT_AMBULATORY_CARE_PROVIDER_SITE_OTHER): Admitting: Family Medicine

## 2024-03-31 ENCOUNTER — Other Ambulatory Visit: Payer: Self-pay | Admitting: *Deleted

## 2024-03-31 VITALS — BP 150/80 | HR 80 | Temp 97.8°F | Ht 66.0 in | Wt 196.0 lb

## 2024-03-31 DIAGNOSIS — R21 Rash and other nonspecific skin eruption: Secondary | ICD-10-CM

## 2024-03-31 DIAGNOSIS — Z9189 Other specified personal risk factors, not elsewhere classified: Secondary | ICD-10-CM

## 2024-03-31 DIAGNOSIS — R600 Localized edema: Secondary | ICD-10-CM | POA: Diagnosis not present

## 2024-03-31 DIAGNOSIS — I10 Essential (primary) hypertension: Secondary | ICD-10-CM

## 2024-03-31 DIAGNOSIS — M85852 Other specified disorders of bone density and structure, left thigh: Secondary | ICD-10-CM

## 2024-03-31 DIAGNOSIS — D693 Immune thrombocytopenic purpura: Secondary | ICD-10-CM

## 2024-03-31 DIAGNOSIS — Z5181 Encounter for therapeutic drug level monitoring: Secondary | ICD-10-CM

## 2024-03-31 MED ORDER — ALENDRONATE SODIUM 70 MG PO TABS: 70.0000 mg | ORAL_TABLET | ORAL | 0 refills | Status: DC

## 2024-03-31 NOTE — Progress Notes (Signed)
 Chief Complaint  Patient presents with   Rash    Rash on both ankles and swelling of both feet and toes.    She started with swelling in both feet/ankles last Wednesday, while in [place], and had done a lot of walking.  Noticed more swelling on Thursday, and even worse on Friday, 6/13.  That night (6/13) while eating in a restaurant, she noticed that her left inner ankle was itchy. She scratched at it (didn't look at it at that time).  Later that evening she saw the rash (medially) and saw that both feet were very swollen.  She was still in [place] when this occurred. She noticed rash the following morning (6/14), and it was never really itchy. Denies swelling getting any worse since flying home, but wasn't much better.  Ankles stayed swollen most of the day yesterday.  This morning, swelling is a little less (but hadn't been following that pattern since Fri/Sat, still had morning swelling).  She got back from [place] at midnight on Saturday. Diet was different while away, eating in restaurants, likely saltier foods, some french fries.    PMH, PSH, SH reviewed  Outpatient Encounter Medications as of 03/31/2024  Medication Sig Note   acetaminophen  (TYLENOL ) 500 MG tablet Take 1,000 mg by mouth every 6 (six) hours as needed for mild pain (pain score 1-3), fever or headache. 02/26/2024: As needed   alendronate  (FOSAMAX ) 70 MG tablet Take 1 tablet (70 mg total) by mouth every 7 (seven) days. Take with a full glass of water on an empty stomach.    amLODipine  (NORVASC ) 10 MG tablet Take 1 tablet (10 mg total) by mouth daily.    aspirin  EC 81 MG tablet Take 1 tablet (81 mg total) by mouth daily. Swallow whole.    Cholecalciferol  (VITAMIN D3) 50 MCG (2000 UT) TABS Take 2,000 Units by mouth in the morning.    citalopram  (CELEXA ) 20 MG tablet Take 1 tablet (20 mg total) by mouth daily.    Coenzyme Q10 (COQ10) 100 MG CAPS Take 100 mg by mouth daily.    pantoprazole  (PROTONIX ) 40 MG tablet Take 40 mg by mouth daily.     rosuvastatin  (CRESTOR ) 20 MG tablet Take 1 tablet (20 mg total) by mouth daily.    SYSTANE COMPLETE PF 0.6 % SOLN Place 1 drop into both eyes 3 (three) times daily as needed (for dryness or irritation). 02/26/2024: As needed   [DISCONTINUED] alendronate  (FOSAMAX ) 70 MG tablet Take 1 tablet (70 mg total) by mouth every 7 (seven) days. Take with a full glass of water on an empty stomach. 02/26/2024: Takes on Sunday   [DISCONTINUED] co-enzyme Q-10 30 MG capsule Take 30 mg by mouth daily.    No facility-administered encounter medications on file as of 03/31/2024.   Allergies  Allergen Reactions   Other Other (See Comments)    Patient has Eosinophilic Esophagitis = Heartburn/acid reflux Eosinophilic esophagitis (EoE) is an inflammation of the esophagus (the tube connecting the mouth to the stomach), caused by a specific white blood cell - the eosinophil. NO eggs, juices, hot dogs, milk, cucumbers, etc...Aaron AasAaron AasAaron Aas    ROS: No URI symptoms, F/C, other rashes, bleeding, bruising, CP, SOB, mood changes, GI complaints. Rash and LE swelling per HPI.   PHYSICAL EXAM:  BP (!) 168/90   Pulse 80   Temp 97.8 F (36.6 C) (Oral)   Ht 5' 6 (1.676 m)   Wt 196 lb (88.9 kg)   BMI 31.64 kg/m    Wt  Readings from Last 3 Encounters:  03/31/24 196 lb (88.9 kg)  03/18/24 195 lb 8 oz (88.7 kg)  02/26/24 198 lb (89.8 kg)   Pleasant, well-appearing female in no distress LE's: Left: there is a 5 x 4 cm area above the medial malleolus, erythematous/macular, poss petechia, with more vague/scattered/diffuse spread further up the medial calf.  There is also a small area of petechia at L lateral ankle.  Right:  mild diffuse redness, no focal area, from above the medial malleolus to lower 1/3 of the calf. Small area of petechia also noted at R latera ankle. Varicose veins on the left leg  Normal pulses. Trace pitting edema at L ankle only.    ASSESSMENT/PLAN:  Rash - unclear etiology, some petechial component,  poss contributed by trauma/scratching vs swelling/edema. Observation, no treatment needed - Plan: CBC with Differential/Platelet  Idiopathic thrombocytopenic purpura (ITP) (HCC) - declined platelet check today, added CBC to lab orders for Friday (already scheduled) - Plan: CBC with Differential/Platelet  Medication monitoring encounter - Plan: CBC with Differential/Platelet  Added CBC to labs Friday,  F/u Monday as scheduled   I encourage use of compression socks when there is swelling and travel.  These are best utilized when put on in the morning, before swelling worsens. I suspect that increase in sodium related to eating in restaurants and traveling likely contributed to the increased swelling in both feet. I recommend observation for the rash--it doesn't seem to be worsening, and the itching has resolved. You can use hydrocortisone  cream 2-3 times/day if the itching recurs. We will be rechecking the platelets on Friday with your other labs.

## 2024-03-31 NOTE — Patient Instructions (Signed)
  I encourage use of compression socks when there is swelling and travel.  These are best utilized when put on in the morning, before swelling worsens. I suspect that increase in sodium related to eating in restaurants and traveling likely contributed to the increased swelling in both feet. I recommend observation for the rash--it doesn't seem to be worsening, and the itching has resolved. You can use hydrocortisone  cream 2-3 times/day if the itching recurs. We will be rechecking the platelets on Friday with your other labs.

## 2024-03-31 NOTE — Telephone Encounter (Signed)
 Patient scheduled for 11:45 today.

## 2024-04-01 NOTE — Progress Notes (Signed)
 Entered repeat bp, it was 150/80

## 2024-04-04 ENCOUNTER — Other Ambulatory Visit

## 2024-04-04 ENCOUNTER — Other Ambulatory Visit: Payer: Self-pay | Admitting: Family Medicine

## 2024-04-04 DIAGNOSIS — E7800 Pure hypercholesterolemia, unspecified: Secondary | ICD-10-CM

## 2024-04-04 DIAGNOSIS — R7989 Other specified abnormal findings of blood chemistry: Secondary | ICD-10-CM

## 2024-04-04 DIAGNOSIS — F411 Generalized anxiety disorder: Secondary | ICD-10-CM

## 2024-04-04 DIAGNOSIS — R21 Rash and other nonspecific skin eruption: Secondary | ICD-10-CM

## 2024-04-04 DIAGNOSIS — I639 Cerebral infarction, unspecified: Secondary | ICD-10-CM

## 2024-04-04 DIAGNOSIS — D693 Immune thrombocytopenic purpura: Secondary | ICD-10-CM

## 2024-04-04 DIAGNOSIS — Z5181 Encounter for therapeutic drug level monitoring: Secondary | ICD-10-CM

## 2024-04-04 NOTE — Telephone Encounter (Signed)
 Called Pt to clarify that she has enough until 6/23 which is her appt. Date. She does have enough, wants to talk to Dr. Monnie Anthony to make sure she doesn't need a dose adjustment. She would like to use the [facility] mail order pharmacy.

## 2024-04-05 LAB — HEPATIC FUNCTION PANEL
ALT: 44 IU/L — ABNORMAL HIGH (ref 0–32)
AST: 34 IU/L (ref 0–40)
Albumin: 4.3 g/dL (ref 3.9–4.9)
Alkaline Phosphatase: 90 IU/L (ref 44–121)
Bilirubin Total: 0.6 mg/dL (ref 0.0–1.2)
Bilirubin, Direct: 0.22 mg/dL (ref 0.00–0.40)
Total Protein: 6.7 g/dL (ref 6.0–8.5)

## 2024-04-05 LAB — LIPID PANEL
Chol/HDL Ratio: 2.6 ratio (ref 0.0–4.4)
Cholesterol, Total: 142 mg/dL (ref 100–199)
HDL: 55 mg/dL (ref >39)
LDL Chol Calc (NIH): 74 mg/dL (ref 0–99)
Triglycerides: 62 mg/dL (ref 0–149)
VLDL Cholesterol Cal: 13 mg/dL (ref 5–40)

## 2024-04-05 LAB — CBC WITH DIFFERENTIAL/PLATELET
Basophils Absolute: 0.1 10*3/uL (ref 0.0–0.2)
Basos: 2 %
EOS (ABSOLUTE): 0.2 10*3/uL (ref 0.0–0.4)
Eos: 6 %
Hematocrit: 38.4 % (ref 34.0–46.6)
Hemoglobin: 12.4 g/dL (ref 11.1–15.9)
Immature Grans (Abs): 0 10*3/uL (ref 0.0–0.1)
Immature Granulocytes: 0 %
Lymphocytes Absolute: 0.6 10*3/uL — ABNORMAL LOW (ref 0.7–3.1)
Lymphs: 20 %
MCH: 28.1 pg (ref 26.6–33.0)
MCHC: 32.3 g/dL (ref 31.5–35.7)
MCV: 87 fL (ref 79–97)
Monocytes Absolute: 0.2 10*3/uL (ref 0.1–0.9)
Monocytes: 8 %
Neutrophils Absolute: 1.9 10*3/uL (ref 1.4–7.0)
Neutrophils: 64 %
Platelets: 88 10*3/uL — CL (ref 150–450)
RBC: 4.41 x10E6/uL (ref 3.77–5.28)
RDW: 13.2 % (ref 11.7–15.4)
WBC: 3 10*3/uL — ABNORMAL LOW (ref 3.4–10.8)

## 2024-04-05 LAB — TSH: TSH: 4.16 u[IU]/mL (ref 0.450–4.500)

## 2024-04-06 NOTE — Progress Notes (Unsigned)
 No chief complaint on file.  Patient presents for 6 month f/u on chronic problems.  She was recently seen with rash on ankles.  H/o stroke in 01/2024.  She had a normal 30 day event monitor. She is compliant with 81 mg ASA (no plavix  or other meds d/t TTP), and is aware of recommendation to hold her aspirin  if platelets drop below 50K. Her LDL was 75 at the time of her stroke, and her Crestor  dose was increased to 20mg .  She had labs repeated prior to today's visit, with LDL 74.   Component Ref Range & Units (hover) 2 d ago (04/04/24) 2 mo ago (02/03/24) 1 yr ago (02/13/23) 1 yr ago (08/11/22) 1 yr ago (06/02/22) 2 yr ago (02/02/22) 2 yr ago (08/11/21)  Cholesterol, Total 142  160 194 147 223 High  216 High   Triglycerides 62 58 R 71 55 46 58 71  HDL 55 63 R 72 70 69 70 71  VLDL Cholesterol Cal 13  14 10 10 10 13   LDL Chol Calc (NIH) 74  74 114 High  68 143 High  132 High   Chol/HDL Ratio 2.6 2.4 R 2.2 CM 2.8 CM 2.1 CM 3.2 CM 3.0 CM    Hypertension. She continues on amlodipine  10mg . Losartan  HCT was stopped upon discharge from the hospital from her stroke in 01/2024, as BP's weren't high. She was to monitor her BP's and resume losartan  HCT if BP trended back up.  ***taking?? BP's are running ***  pt's home monitor last verified as accurate on 08/2022  BP Readings from Last 3 Encounters:  04/01/24 (!) 150/80  03/18/24 (!) 158/82  02/26/24 (!) 140/80     She denies dizziness, chest pain, palpitations, shortness of breath, edema (rare). Denies exertional chest pain or dyspnea. She has been having more headaches at her L temple.  No known clenching or grinding.  This has been since the infusions.  HA's are short-lived, doesn't require medication.     Anxiety: She continues on citalopram  20mg .   She lost her son earlier this year. She is now raising her 2 grandsons. ***    Thrombocytopenia/ITP:  under the care of Dr. Cloretta. She completed a course of rituximab  in October and has  been maintained off of prednisone  since 08/2023.  Last seen in 10/2023, has appt in July. She denies any bleeding/bruising.   Component Ref Range & Units (hover) 2 d ago (04/04/24) 2 wk ago (03/18/24) 1 mo ago (02/28/24) 1 mo ago (02/26/24) 1 mo ago (02/21/24) 1 mo ago (02/07/24) 2 mo ago (02/03/24)  WBC 3.0 Low  4.1 4.3 R 3.8 4.0 R 3.5 Low  R 3.9 Low  R  RBC 4.41 4.46 4.48 R 4.45 4.26 R 4.42 R 4.22 R  Comment: Polychromasia present  Hemoglobin 12.4 12.9 13.0 R 12.8 12.4 R 12.8 R 12.2 R  Hematocrit 38.4 38.9 37.5 R 38.2 35.5 Low  R 36.9 R 35.9 Low  R  MCV 87 87 83.7 R 86 83.3 R 83.5 R 85.1 R  MCH 28.1 28.9 29.0 R 28.8 29.1 R 29.0 R 28.9 R  MCHC 32.3 33.2 34.7 R 33.5 34.9 R 34.7 R 34.0 R  RDW 13.2 13.1 13.3 R 13.6 13.3 R 13.4 R 13.6 R  Platelets 88 Low Panic  101 Low  62 Low  R, CM 53 Low Panic  66 Low  R 95 Low  R 104 Low       EOE, GERD:  diagnosed/treated  by Dr. Saintclair.  She is taking PPI twice daily.   Had EGD in 03/2023,  3 year f/u recommended. No med changes were made at that time. She has some occasional regurgitation, only rare heartburn.  Denies dysphagia.     Osteopenia: 12/2021 DEXA showed T-2.3 R fem neck; FRAX 19.3/3.5%. Prior DEXA was in 2021--T-2.4 at L fem neck in 2021. FRAX was normal.   Alendronate  was started in April 2023. She reports tolerating this without side effects, no chest pain.  She used to take a calcium  supplement daily, but was told by Dr. Cloretta to stop this.  She can't tolerate milk and doesn't drink non-dairy milk.  She can't tolerate much yogurt, does better with [place], but hasn't been having any yogurt.  She doesn't eat kale, collards or much greens. ***UPDATE CALCIUM  INTAKE  She continues to take vitamin D . She hasn't been getting regular weight bearing exercise. ***  Due for f/u bone density test (ordered in November, was due in March, now no longer being done at [facility]).   H/o vitamin D  deficiency: level was low at 19 in 2016.  Last check was normal  at 49.8 in 07/2021, when taking 2000 IU daily.  She continues on same vitamins.   Elevated TSH in April. Recheck prior to today's visit was improved. She denies thyroid  symptoms  Component Ref Range & Units (hover) 2 d ago 2 mo ago 5 yr ago 6 yr ago 7 yr ago 9 yr ago 11 yr ago  TSH 4.160 7.257 High  R, CM 3.120 3.78 R 3.18 R, CM 3.165 R 4.007 R     PMH, PSH, SH reviewed   ROS:  No fever/chills, URI symptoms.   No shortness of breath, chest pain. No headaches or dizziness. No edema. No numbness, tingling, weakness, tremor, vertigo. Rash *** See HPI    PHYSICAL EXAM:  There were no vitals taken for this visit.  Wt Readings from Last 3 Encounters:  03/31/24 196 lb (88.9 kg)  03/18/24 195 lb 8 oz (88.7 kg)  02/26/24 198 lb (89.8 kg)   Pleasant, well-appearing female, accompanied by her husband. HEENT: conjunctiva and sclera are clear, EOMI. OP clear, sinuses nontender Neck: no lymphadenopathy, thyromegaly or mass, no bruit Heart: regular rate and rhythm Lungs: clear bilaterally Back: no CVA tenderness Abdomen: soft, nontender, no mass Extremities: no edema Neuro: alert and oriented, cranial nerves intact. Normal gait. Psych: normal mood, affect, hygiene and grooming.  Lab Results  Component Value Date   WBC 3.0 (L) 04/04/2024   HGB 12.4 04/04/2024   HCT 38.4 04/04/2024   MCV 87 04/04/2024   PLT 88 (LL) 04/04/2024   Lab Results  Component Value Date   CHOL 142 04/04/2024   HDL 55 04/04/2024   LDLCALC 74 04/04/2024   TRIG 62 04/04/2024   CHOLHDL 2.6 04/04/2024   Lab Results  Component Value Date   ALT 44 (H) 04/04/2024   AST 34 04/04/2024   ALKPHOS 90 04/04/2024   BILITOT 0.6 04/04/2024   Lab Results  Component Value Date   TSH 4.160 04/04/2024      ASSESSMENT/PLAN:    RF fosamax  and amlodipine ?  She is due for bone density test. It was ordered in 08/2023, was due in March.  Pretty sure this was ordered by [facility] center, who no longer does them.  Did she ever get appt scheduled?  Needs to be changed elsewhere now--not sure if we need to enter a new order or what we  need to do. Likely needs new order for different location--please order.  Check with pt re: location, but likely fine with [facility] (sees hematologist there)  BP's high at her last few visits. Did she ever restart the losartan  HCT?? (Stopped after stroke, advised to monitor BP at home, and resume if BP's went back up). Consider recheck home monitor to verify accuracy  Increase crestor  dose-- mail order?  Counseling?   ***send CBC to Dr. Cloretta

## 2024-04-07 ENCOUNTER — Ambulatory Visit: Admitting: Family Medicine

## 2024-04-07 ENCOUNTER — Ambulatory Visit: Payer: Self-pay | Admitting: Family Medicine

## 2024-04-07 ENCOUNTER — Encounter: Payer: Self-pay | Admitting: Family Medicine

## 2024-04-07 VITALS — BP 130/82 | HR 76 | Wt 192.4 lb

## 2024-04-07 DIAGNOSIS — M85852 Other specified disorders of bone density and structure, left thigh: Secondary | ICD-10-CM

## 2024-04-07 DIAGNOSIS — R7301 Impaired fasting glucose: Secondary | ICD-10-CM

## 2024-04-07 DIAGNOSIS — E7800 Pure hypercholesterolemia, unspecified: Secondary | ICD-10-CM

## 2024-04-07 DIAGNOSIS — I10 Essential (primary) hypertension: Secondary | ICD-10-CM

## 2024-04-07 DIAGNOSIS — Z9189 Other specified personal risk factors, not elsewhere classified: Secondary | ICD-10-CM

## 2024-04-07 DIAGNOSIS — F411 Generalized anxiety disorder: Secondary | ICD-10-CM | POA: Diagnosis not present

## 2024-04-07 DIAGNOSIS — Z8673 Personal history of transient ischemic attack (TIA), and cerebral infarction without residual deficits: Secondary | ICD-10-CM

## 2024-04-07 DIAGNOSIS — D693 Immune thrombocytopenic purpura: Secondary | ICD-10-CM

## 2024-04-07 DIAGNOSIS — R7989 Other specified abnormal findings of blood chemistry: Secondary | ICD-10-CM

## 2024-04-07 DIAGNOSIS — I639 Cerebral infarction, unspecified: Secondary | ICD-10-CM

## 2024-04-07 MED ORDER — CITALOPRAM HYDROBROMIDE 20 MG PO TABS: 20.0000 mg | ORAL_TABLET | Freq: Every day | ORAL | 1 refills | Status: AC

## 2024-04-07 MED ORDER — ALENDRONATE SODIUM 70 MG PO TABS
70.0000 mg | ORAL_TABLET | ORAL | 0 refills | Status: DC
Start: 2024-04-07 — End: 2024-07-24

## 2024-04-07 MED ORDER — AMLODIPINE BESYLATE 10 MG PO TABS: 10.0000 mg | ORAL_TABLET | Freq: Every day | ORAL | 1 refills | Status: DC

## 2024-04-07 MED ORDER — ROSUVASTATIN CALCIUM 40 MG PO TABS: 40.0000 mg | ORAL_TABLET | Freq: Every day | ORAL | 2 refills | Status: DC

## 2024-04-07 NOTE — Patient Instructions (Addendum)
 Please return for a nurse visit to have your blood pressure monitor verified again. If it is accurate, and BP's are low 130's/low 80's, we don't need to make changes. If it isn't accurate, we should resume the losartan  HCT (based on all the higher reading in various doctors offices since then).  Please monitor BP daily, and bring a list of the blood pressures when you come for the nurse visit.  Please let us  know if you prefer to get your bone density test at [facility]. They usually will schedule you and fax us  an order to sign. If you prefer to stay at [facility], or another [facility] location, let us  know and we can re-enter the DEXA order.  Please discuss with Dr. Cloretta the reasons you aren't supposed to take calcium  (?)--perhaps this is no longer a relevant recommendation.  It is important for you to get adequate calcium  intake (either through diet or supplements) in order for the alendronate  to help your bones.

## 2024-04-10 NOTE — Telephone Encounter (Signed)
-----   Message from Rollo FABIENE Louder sent at 03/28/2024  7:20 AM EDT ----- Please tell patient that their recent cardiac monitor showed no pauses, no AV block, no atrial fibrillation. Normal monitor. No changes to treatment plan   Thanks KJ  ----- Message ----- From: Tobb, Kardie, DO Sent: 03/25/2024   8:02 AM EDT To: Rollo JONELLE Louder, PA-C

## 2024-04-10 NOTE — Telephone Encounter (Signed)
 Left message to call back

## 2024-04-11 ENCOUNTER — Telehealth: Payer: Self-pay

## 2024-04-11 ENCOUNTER — Encounter: Payer: Self-pay | Admitting: Nurse Practitioner

## 2024-04-11 ENCOUNTER — Ambulatory Visit (INDEPENDENT_AMBULATORY_CARE_PROVIDER_SITE_OTHER): Admitting: Nurse Practitioner

## 2024-04-11 VITALS — BP 136/84 | HR 78 | Wt 194.0 lb

## 2024-04-11 DIAGNOSIS — Z8673 Personal history of transient ischemic attack (TIA), and cerebral infarction without residual deficits: Secondary | ICD-10-CM | POA: Diagnosis not present

## 2024-04-11 DIAGNOSIS — R350 Frequency of micturition: Secondary | ICD-10-CM

## 2024-04-11 DIAGNOSIS — R309 Painful micturition, unspecified: Secondary | ICD-10-CM

## 2024-04-11 DIAGNOSIS — N3090 Cystitis, unspecified without hematuria: Secondary | ICD-10-CM | POA: Insufficient documentation

## 2024-04-11 LAB — POCT URINALYSIS DIP (CLINITEK)
Bilirubin, UA: NEGATIVE
Glucose, UA: NEGATIVE mg/dL
Ketones, POC UA: NEGATIVE mg/dL
Nitrite, UA: NEGATIVE
POC PROTEIN,UA: NEGATIVE
Spec Grav, UA: 1.01 (ref 1.010–1.025)
Urobilinogen, UA: 0.2 U/dL
pH, UA: 6 (ref 5.0–8.0)

## 2024-04-11 MED ORDER — NITROFURANTOIN MONOHYD MACRO 100 MG PO CAPS: 100.0000 mg | ORAL_CAPSULE | Freq: Two times a day (BID) | ORAL | 0 refills | Status: DC

## 2024-04-11 NOTE — Progress Notes (Unsigned)
  Camie FORBES Doing, DNP, AGNP-c [facility] [address] [place], [place] [address] [phone]   ACUTE VISIT- ESTABLISHED PATIENT  Blood pressure 136/84, pulse 78, weight 194 lb (88 kg).  Subjective:  HPI Carol Thomas is a 68 y.o. female presents to day for evaluation of acute concern(s).     ROS negative except for what is listed in HPI. History, Medications, Surgery, SDOH, and Family History reviewed and updated as appropriate.  Objective:  Physical Exam Vitals and nursing note reviewed.  Constitutional:      Appearance: Normal appearance.   Eyes:     Conjunctiva/sclera: Conjunctivae normal.    Cardiovascular:     Rate and Rhythm: Normal rate and regular rhythm.     Pulses: Normal pulses.     Heart sounds: Normal heart sounds.  Pulmonary:     Effort: Pulmonary effort is normal.     Breath sounds: Normal breath sounds.  Abdominal:     General: There is no distension.     Palpations: Abdomen is soft.     Tenderness: There is no abdominal tenderness. There is no right CVA tenderness, left CVA tenderness or guarding.   Musculoskeletal:     Right lower leg: No edema.     Left lower leg: No edema.   Skin:    General: Skin is warm and dry.     Capillary Refill: Capillary refill takes less than 2 seconds.   Neurological:     Mental Status: She is alert and oriented to person, place, and time.   Psychiatric:        Mood and Affect: Mood normal.         Assessment & Plan:   Problem List Items Addressed This Visit   None Visit Diagnoses       Pain with urination    -  Primary   Relevant Orders   POCT URINALYSIS DIP (CLINITEK) (Completed)   Urine Culture     Frequent urination       Relevant Orders   POCT URINALYSIS DIP (CLINITEK) (Completed)   Urine Culture         Camie FORBES Doing, DNP, AGNP-c

## 2024-04-11 NOTE — Telephone Encounter (Signed)
 Spoke to pt she is having urinary frequency and some abd discomfort, no burning  She will come in today to see Camie

## 2024-04-11 NOTE — Telephone Encounter (Signed)
 Pt. Needs appt. For possible UTI.   Copied from CRM [id number]. Topic: Clinical - Medical Advice >> Apr 11, 2024 11:41 AM Graeme ORN wrote: Reason for CRM: Patient called. Request call back from [place] about a medical concern. Did not provide much information. Thinks she may have UTI. Thank You

## 2024-04-11 NOTE — Telephone Encounter (Signed)
Follow Up: ° ° ° ° ° °Patient is returning call from yesterday °

## 2024-04-14 NOTE — Telephone Encounter (Signed)
 Left message to call back

## 2024-04-14 NOTE — Assessment & Plan Note (Signed)
 Symptoms began 3-4 days ago with dysuria and increased frequency. Urinalysis shows hematuria and pyuria, indicating infection. Absence of fever, chills, or back pain suggests no progression to pyelonephritis. Increased water intake has not resolved symptoms. Nitrofurantoin (Macrobid) selected for its efficacy against common UTI pathogens. She does not typically experience antibiotic-associated yeast infections but was advised to report any symptoms. A urine culture will confirm antibiotic efficacy against the pathogen. - Prescribe nitrofurantoin (Macrobid) - Send urine culture to confirm antibiotic coverage - Advise to report if symptoms do not improve

## 2024-04-14 NOTE — Assessment & Plan Note (Signed)
 Experienced two strokes approximately a month after son's passing. Strokes were atypical, occurring on the left side of the brain and affecting the left side of the body. She is on aspirin  due to ITP, as anticoagulants are contraindicated.

## 2024-04-15 LAB — URINE CULTURE

## 2024-04-15 LAB — HM DEXA SCAN

## 2024-04-17 ENCOUNTER — Ambulatory Visit: Payer: Self-pay | Admitting: Family Medicine

## 2024-04-24 ENCOUNTER — Other Ambulatory Visit: Payer: TRICARE For Life (TFL)

## 2024-04-24 ENCOUNTER — Ambulatory Visit
Admission: RE | Admit: 2024-04-24 | Discharge: 2024-04-24 | Disposition: A | Discharge status: Home / Self Care | Payer: TRICARE For Life (TFL) | Source: Ambulatory Visit | Attending: Family Medicine | Admitting: Family Medicine

## 2024-04-24 ENCOUNTER — Encounter: Payer: Self-pay | Admitting: Oncology

## 2024-04-24 DIAGNOSIS — Z1231 Encounter for screening mammogram for malignant neoplasm of breast: Secondary | ICD-10-CM

## 2024-04-29 ENCOUNTER — Other Ambulatory Visit: Payer: Self-pay | Admitting: Family Medicine

## 2024-04-29 DIAGNOSIS — M85852 Other specified disorders of bone density and structure, left thigh: Secondary | ICD-10-CM

## 2024-04-29 DIAGNOSIS — Z9189 Other specified personal risk factors, not elsewhere classified: Secondary | ICD-10-CM

## 2024-04-29 NOTE — Telephone Encounter (Signed)
   Spoke with patient. She does NOT need it at local pharmacy. She has already received this from [facility]. I will deny this

## 2024-04-30 NOTE — Telephone Encounter (Signed)
 Patient requested appointment be canceled

## 2024-05-07 ENCOUNTER — Ambulatory Visit: Payer: Self-pay | Admitting: Nurse Practitioner

## 2024-05-08 ENCOUNTER — Ambulatory Visit: Payer: TRICARE For Life (TFL) | Admitting: Oncology

## 2024-05-08 ENCOUNTER — Inpatient Hospital Stay (HOSPITAL_BASED_OUTPATIENT_CLINIC_OR_DEPARTMENT_OTHER): Admitting: Oncology

## 2024-05-08 ENCOUNTER — Other Ambulatory Visit: Payer: TRICARE For Life (TFL)

## 2024-05-08 ENCOUNTER — Inpatient Hospital Stay: Attending: Oncology

## 2024-05-08 VITALS — BP 128/74 | HR 78 | Temp 97.7°F | Resp 18 | Ht 66.0 in | Wt 192.4 lb

## 2024-05-08 DIAGNOSIS — D696 Thrombocytopenia, unspecified: Secondary | ICD-10-CM

## 2024-05-08 DIAGNOSIS — Z85820 Personal history of malignant melanoma of skin: Secondary | ICD-10-CM | POA: Diagnosis not present

## 2024-05-08 DIAGNOSIS — Z860100 Personal history of colon polyps, unspecified: Secondary | ICD-10-CM | POA: Diagnosis not present

## 2024-05-08 DIAGNOSIS — K200 Eosinophilic esophagitis: Secondary | ICD-10-CM | POA: Insufficient documentation

## 2024-05-08 DIAGNOSIS — D890 Polyclonal hypergammaglobulinemia: Secondary | ICD-10-CM | POA: Insufficient documentation

## 2024-05-08 DIAGNOSIS — F419 Anxiety disorder, unspecified: Secondary | ICD-10-CM | POA: Insufficient documentation

## 2024-05-08 DIAGNOSIS — D72819 Decreased white blood cell count, unspecified: Secondary | ICD-10-CM | POA: Diagnosis not present

## 2024-05-08 DIAGNOSIS — I10 Essential (primary) hypertension: Secondary | ICD-10-CM | POA: Insufficient documentation

## 2024-05-08 DIAGNOSIS — D693 Immune thrombocytopenic purpura: Secondary | ICD-10-CM | POA: Insufficient documentation

## 2024-05-08 DIAGNOSIS — E7800 Pure hypercholesterolemia, unspecified: Secondary | ICD-10-CM | POA: Insufficient documentation

## 2024-05-08 LAB — CBC WITH DIFFERENTIAL (CANCER CENTER ONLY)
Abs Immature Granulocytes: 0.01 K/uL (ref 0.00–0.07)
Basophils Absolute: 0 K/uL (ref 0.0–0.1)
Basophils Relative: 1 %
Eosinophils Absolute: 0.1 K/uL (ref 0.0–0.5)
Eosinophils Relative: 3 %
HCT: 36.1 % (ref 36.0–46.0)
Hemoglobin: 12.1 g/dL (ref 12.0–15.0)
Immature Granulocytes: 0 %
Lymphocytes Relative: 23 %
Lymphs Abs: 0.9 K/uL (ref 0.7–4.0)
MCH: 27.9 pg (ref 26.0–34.0)
MCHC: 33.5 g/dL (ref 30.0–36.0)
MCV: 83.2 fL (ref 80.0–100.0)
Monocytes Absolute: 0.3 K/uL (ref 0.1–1.0)
Monocytes Relative: 7 %
Neutro Abs: 2.7 K/uL (ref 1.7–7.7)
Neutrophils Relative %: 66 %
Platelet Count: 61 K/uL — ABNORMAL LOW (ref 150–400)
RBC: 4.34 MIL/uL (ref 3.87–5.11)
RDW: 13.5 % (ref 11.5–15.5)
WBC Count: 4.1 K/uL (ref 4.0–10.5)
nRBC: 0 % (ref 0.0–0.2)

## 2024-05-08 NOTE — Progress Notes (Signed)
 [facility] OFFICE PROGRESS NOTE   Diagnosis: ITP  INTERVAL HISTORY:   Carol Thomas returns as scheduled.  She was diagnosed with multivessel distribution CVAs in April when she presented with left-sided weakness.  An MRI revealed left cerebellar and occipital infarcts.  She is now maintained on aspirin .  She reports easy bruising.  No other bleeding.  Objective:  Vital signs in last 24 hours:  Blood pressure 128/74, pulse 78, temperature 97.7 F (36.5 C), temperature source Temporal, resp. rate 18, height 5' 6 (1.676 m), weight 192 lb 6.4 oz (87.3 kg), SpO2 100%.   Lymphatics: No cervical, supraclavicular, axillary, or inguinal nodes Resp: Clear bilaterally Cardio: Regular rate and rhythm GI: No hepatosplenomegaly Vascular: No leg edema  Skin: Few small ecchymoses over the trunk and extremities   Lab Results:  Lab Results  Component Value Date   WBC 4.1 05/08/2024   HGB 12.1 05/08/2024   HCT 36.1 05/08/2024   MCV 83.2 05/08/2024   PLT 61 (L) 05/08/2024   NEUTROABS 2.7 05/08/2024    CMP  Lab Results  Component Value Date   NA 134 (L) 02/03/2024   K 3.5 02/03/2024   CL 99 02/03/2024   CO2 24 02/03/2024   GLUCOSE 104 (H) 02/03/2024   BUN 14 02/03/2024   CREATININE 1.02 (H) 02/03/2024   CALCIUM  9.6 02/03/2024   PROT 6.7 04/04/2024   ALBUMIN 4.3 04/04/2024   AST 34 04/04/2024   ALT 44 (H) 04/04/2024   ALKPHOS 90 04/04/2024   BILITOT 0.6 04/04/2024   GFRNONAA >60 02/03/2024   GFRAA 72 07/28/2020    No results found for: CEA1, CEA, CAN199, CA125  Lab Results  Component Value Date   INR 1.0 02/02/2024   LABPROT 13.8 02/02/2024    Imaging:  No results found.  Medications: I have reviewed the patient's current medications.   Assessment/Plan:  Thrombocytopenia-progressive 09/20/2022 Admission 09/20/2022 with severe thrombocytopenia, prednisone  09/20/2022, Solu-Medrol  1 mg/kilogram daily start 09/20/2022 Bone marrow biopsy  09/21/2022-hypercellular marrow for age, increased megakaryocytes, no evidence of a lymphoproliferative disorder Prednisone  80 mg daily 09/22/2022 Prednisone  40 mg daily 09/26/2022 Prednisone  30 mg daily 10/04/2022 Prednisone  20 mg daily 11/01/2022 Prednisone  15 mg daily 11/23/2022 Prednisone  10 mg daily 01/03/2023 Prednisone  5 mg daily 01/26/2023 Prednisone  2.5 mg daily 02/22/2023 Prednisone  5 mg daily 03/14/2023 Prednisone  5 alternating with 2.5 mg daily 04/03/2023 Prednisone  40 mg daily X 3, then 20 mg daily start 05/24/2023 Prednisone  decreased to 10 mg daily 06/27/2023 Prednisone  decreased to 10 mg alternating with 5 mg daily 07/04/2023 Cycle 1 rituximab  07/04/2023 Cycle 2 rituximab  07/11/2023 Cycle 3 rituximab  07/18/2023 Prednisone  decreased to 5 mg daily 07/25/2023 Cycle 4 rituximab  07/25/2023 Prednisone  decreased to 5 mg every other day 08/15/2023 Prednisone  discontinued 09/05/2023 Leukopenia History of melanoma located left leg below the knee status post post excision 1998-completed an immunotherapy trial at [facility] Eosinophilic esophagitis Hypertension Anxiety History of colon polyps Hypercholesterolemia  Elevated IgM level Polyclonal hypergammaglobulinemia 02/02/2024: Left cerebellar and left occipital CVAs-aspirin    Disposition: Carol Thomas has chronic thrombocytopenia secondary to ITP.  The platelet count remains adequate.  She will call for spontaneous bleeding or bruising.  She is now maintained on aspirin  after a CVA in April.  No source for embolization was found.  We will check a lupus anticoagulant panel.  Carol Thomas will return for a lab visit in 3 months and an office visit in 6 months.  We will see her sooner if the lupus anticoagulant panel returns positive.  Arley Hof, MD  05/08/2024  2:52 PM

## 2024-05-09 LAB — PTT-LA MIX: PTT-LA Mix: 97.6 s — ABNORMAL HIGH (ref 0.0–40.5)

## 2024-05-09 LAB — LUPUS ANTICOAGULANT PANEL
DRVVT: 79.5 s — ABNORMAL HIGH (ref 0.0–47.0)
PTT Lupus Anticoagulant: 101 s — ABNORMAL HIGH (ref 0.0–43.5)

## 2024-05-09 LAB — HEXAGONAL PHASE PHOSPHOLIPID: Hexagonal Phase Phospholipid: 79 s — ABNORMAL HIGH (ref 0–11)

## 2024-05-09 LAB — DRVVT CONFIRM: dRVVT Confirm: 1.8 ratio — ABNORMAL HIGH (ref 0.8–1.2)

## 2024-05-09 LAB — DRVVT MIX: dRVVT Mix: 82.8 s — ABNORMAL HIGH (ref 0.0–40.4)

## 2024-05-10 ENCOUNTER — Ambulatory Visit: Payer: Self-pay | Admitting: Oncology

## 2024-05-12 ENCOUNTER — Encounter: Payer: Self-pay | Admitting: Oncology

## 2024-05-12 ENCOUNTER — Other Ambulatory Visit: Payer: Self-pay | Admitting: Nurse Practitioner

## 2024-05-12 DIAGNOSIS — D696 Thrombocytopenia, unspecified: Secondary | ICD-10-CM

## 2024-05-12 NOTE — Telephone Encounter (Signed)
 Notified Carol Thomas that her lupus anticoagulant is positive, she may have antiphospholipid syndrome, needs to have beta-2  glycoprotein and cardiolipin antibody testing this week. She agrees to come on 7/29

## 2024-05-13 ENCOUNTER — Other Ambulatory Visit

## 2024-05-13 DIAGNOSIS — D693 Immune thrombocytopenic purpura: Secondary | ICD-10-CM | POA: Diagnosis not present

## 2024-05-13 DIAGNOSIS — D696 Thrombocytopenia, unspecified: Secondary | ICD-10-CM

## 2024-05-15 ENCOUNTER — Encounter: Payer: Self-pay | Admitting: Oncology

## 2024-05-15 LAB — CARDIOLIPIN ANTIBODIES, IGG, IGM, IGA
Anticardiolipin IgA: <9 U/mL (ref 0–11)
Anticardiolipin IgG: <9 GPL U/mL (ref 0–14)
Anticardiolipin IgM: 17 [MPL'U]/mL — ABNORMAL HIGH (ref 0–12)

## 2024-05-15 LAB — BETA-2-GLYCOPROTEIN I ABS, IGG/M/A
Beta-2 Glyco I IgG: <9 GPI IgG units (ref 0–20)
Beta-2-Glycoprotein I IgA: <9 GPI IgA units (ref 0–25)
Beta-2-Glycoprotein I IgM: 15 GPI IgM units (ref 0–32)

## 2024-05-16 ENCOUNTER — Telehealth: Payer: Self-pay | Admitting: Oncology

## 2024-05-16 ENCOUNTER — Telehealth: Payer: Self-pay | Admitting: *Deleted

## 2024-05-16 DIAGNOSIS — D696 Thrombocytopenia, unspecified: Secondary | ICD-10-CM

## 2024-05-16 MED ORDER — WARFARIN SODIUM 5 MG PO TABS: 5.0000 mg | ORAL_TABLET | Freq: Every day | ORAL | 2 refills | Status: DC

## 2024-05-16 NOTE — Telephone Encounter (Signed)
 Per Dr. Cloretta: She possibly can have lupus anticoagulant problem. Antibody test was negative. To confirm diagnosis will need to recollect the lupus anticoagulant 12 weeks from prior test. Needs to start warfarin 5 mg daily tonight at ~ 6 pm and continue 81 aspirin . If she wishes a 2nd opinion, can refer her to [facility]. Onesimo at [facility]. She can think about this over the weekend. Needs lab/OV with NP on 8/4. She understands and agrees. High priority scheduling message sent.

## 2024-05-16 NOTE — Telephone Encounter (Signed)
 Follow UP appt scheduled and confirmed

## 2024-05-19 ENCOUNTER — Inpatient Hospital Stay: Attending: Oncology

## 2024-05-19 ENCOUNTER — Encounter: Payer: Self-pay | Admitting: Nurse Practitioner

## 2024-05-19 ENCOUNTER — Inpatient Hospital Stay (HOSPITAL_BASED_OUTPATIENT_CLINIC_OR_DEPARTMENT_OTHER): Admitting: Nurse Practitioner

## 2024-05-19 VITALS — BP 139/80 | HR 78 | Temp 97.8°F | Resp 16 | Ht 66.0 in | Wt 191.1 lb

## 2024-05-19 DIAGNOSIS — I10 Essential (primary) hypertension: Secondary | ICD-10-CM | POA: Diagnosis not present

## 2024-05-19 DIAGNOSIS — D6861 Antiphospholipid syndrome: Secondary | ICD-10-CM | POA: Insufficient documentation

## 2024-05-19 DIAGNOSIS — D693 Immune thrombocytopenic purpura: Secondary | ICD-10-CM | POA: Insufficient documentation

## 2024-05-19 DIAGNOSIS — D696 Thrombocytopenia, unspecified: Secondary | ICD-10-CM | POA: Diagnosis not present

## 2024-05-19 DIAGNOSIS — Z7901 Long term (current) use of anticoagulants: Secondary | ICD-10-CM | POA: Diagnosis not present

## 2024-05-19 DIAGNOSIS — Z85820 Personal history of malignant melanoma of skin: Secondary | ICD-10-CM | POA: Insufficient documentation

## 2024-05-19 LAB — CBC WITH DIFFERENTIAL (CANCER CENTER ONLY)
Abs Immature Granulocytes: 0.01 K/uL (ref 0.00–0.07)
Basophils Absolute: 0 K/uL (ref 0.0–0.1)
Basophils Relative: 1 %
Eosinophils Absolute: 0.1 K/uL (ref 0.0–0.5)
Eosinophils Relative: 3 %
HCT: 36.3 % (ref 36.0–46.0)
Hemoglobin: 12.1 g/dL (ref 12.0–15.0)
Immature Granulocytes: 0 %
Lymphocytes Relative: 25 %
Lymphs Abs: 1 K/uL (ref 0.7–4.0)
MCH: 27.8 pg (ref 26.0–34.0)
MCHC: 33.3 g/dL (ref 30.0–36.0)
MCV: 83.4 fL (ref 80.0–100.0)
Monocytes Absolute: 0.2 K/uL (ref 0.1–1.0)
Monocytes Relative: 6 %
Neutro Abs: 2.6 K/uL (ref 1.7–7.7)
Neutrophils Relative %: 65 %
Platelet Count: 61 K/uL — ABNORMAL LOW (ref 150–400)
RBC: 4.35 MIL/uL (ref 3.87–5.11)
RDW: 13.4 % (ref 11.5–15.5)
WBC Count: 3.9 K/uL — ABNORMAL LOW (ref 4.0–10.5)
nRBC: 0 % (ref 0.0–0.2)

## 2024-05-19 LAB — PROTIME-INR
INR: 1.5 — ABNORMAL HIGH (ref 0.8–1.2)
Prothrombin Time: 19 s — ABNORMAL HIGH (ref 11.4–15.2)

## 2024-05-19 NOTE — Progress Notes (Signed)
 [facility] OFFICE PROGRESS NOTE   Diagnosis: ITP  INTERVAL HISTORY:   Carol Thomas returns for follow-up.  She began Coumadin  5 mg daily 05/16/2024.  She continues to note easy bruising.  No bleeding.  Objective:  Vital signs in last 24 hours:  Blood pressure 139/80, pulse 78, temperature 97.8 F (36.6 C), temperature source Temporal, resp. rate 16, height 5' 6 (1.676 m), weight 191 lb 1.6 oz (86.7 kg), SpO2 100%.    HEENT: No bleeding in the mouth. Resp: Lungs clear bilaterally. Cardio: Regular rate and rhythm. GI: No hepatosplenomegaly. Vascular: No leg edema. Neuro: Alert and oriented. Skin: A few small ecchymoses dorsum right hand.   Lab Results:  Lab Results  Component Value Date   WBC 3.9 (L) 05/19/2024   HGB 12.1 05/19/2024   HCT 36.3 05/19/2024   MCV 83.4 05/19/2024   PLT 61 (L) 05/19/2024   NEUTROABS 2.6 05/19/2024    Imaging:  No results found.  Medications: I have reviewed the patient's current medications.  Assessment/Plan: Thrombocytopenia-progressive 09/20/2022 Admission 09/20/2022 with severe thrombocytopenia, prednisone  09/20/2022, Solu-Medrol  1 mg/kilogram daily start 09/20/2022 Bone marrow biopsy 09/21/2022-hypercellular marrow for age, increased megakaryocytes, no evidence of a lymphoproliferative disorder Prednisone  80 mg daily 09/22/2022 Prednisone  40 mg daily 09/26/2022 Prednisone  30 mg daily 10/04/2022 Prednisone  20 mg daily 11/01/2022 Prednisone  15 mg daily 11/23/2022 Prednisone  10 mg daily 01/03/2023 Prednisone  5 mg daily 01/26/2023 Prednisone  2.5 mg daily 02/22/2023 Prednisone  5 mg daily 03/14/2023 Prednisone  5 alternating with 2.5 mg daily 04/03/2023 Prednisone  40 mg daily X 3, then 20 mg daily start 05/24/2023 Prednisone  decreased to 10 mg daily 06/27/2023 Prednisone  decreased to 10 mg alternating with 5 mg daily 07/04/2023 Cycle 1 rituximab  07/04/2023 Cycle 2 rituximab  07/11/2023 Cycle 3 rituximab  07/18/2023 Prednisone  decreased  to 5 mg daily 07/25/2023 Cycle 4 rituximab  07/25/2023 Prednisone  decreased to 5 mg every other day 08/15/2023 Prednisone  discontinued 09/05/2023 Leukopenia History of melanoma located left leg below the knee status post post excision 1998-completed an immunotherapy trial at [facility] Eosinophilic esophagitis Hypertension Anxiety History of colon polyps Hypercholesterolemia  Elevated IgM level Polyclonal hypergammaglobulinemia 02/02/2024: Left cerebellar and left occipital CVAs-aspirin  Positive lupus anticoagulant 05/08/2024; 05/13/2024 beta-2  glycoprotein IgG/IgM/IgA normal, cardiolipin antibodies IgG and IgA normal, IgM indeterminate range  Disposition: [name] appears stable.  She was recently started on Coumadin  due to concern for possible antiphospholipid antibody syndrome.  PT/INR not in goal range.  She will continue Coumadin  5 mg daily and return for follow-up labs on 05/22/2024.  Plan for repeat lupus anticoagulant approximately 12 weeks from 05/08/2024.  We discussed the increased risk of bleeding related to Coumadin , aspirin , thrombocytopenia.  She will contact the office with any bleeding.  Office visit in approximately 3 weeks.  Patient seen with Dr. Cloretta.  Olam Ned ANP/GNP-BC   05/19/2024  11:42 AM  Carol Thomas had a CVA in April.  She is maintained on aspirin .  The lupus anticoagulant returned positive.  She has a history of ITP.  She may have antiphospholipid syndrome.  We discussed the laboratory findings with her.  We discussed the indication for anticoagulation therapy in patients with antiphospholipid syndrome.  I recommend Coumadin  anticoagulation.  She will continue aspirin  for now.  She is in agreement with the anticoagulation plan.  Will contact neurology to discuss the indication for continuing aspirin .  The PT/INR is elevated after beginning Coumadin  last week.  She will return for a PT/INR later this week.  Will adjust the Coumadin  dose with a goal  PT/INR of  2-3.  I was present for greater than 50% of today's visit.  I performed medical decision making.  Arvella Hof, MD

## 2024-05-22 ENCOUNTER — Inpatient Hospital Stay

## 2024-05-22 ENCOUNTER — Telehealth: Payer: Self-pay

## 2024-05-22 DIAGNOSIS — D696 Thrombocytopenia, unspecified: Secondary | ICD-10-CM

## 2024-05-22 DIAGNOSIS — D693 Immune thrombocytopenic purpura: Secondary | ICD-10-CM | POA: Diagnosis not present

## 2024-05-22 LAB — PROTIME-INR
INR: 2.3 — ABNORMAL HIGH (ref 0.8–1.2)
Prothrombin Time: 26.7 s — ABNORMAL HIGH (ref 11.4–15.2)

## 2024-05-22 NOTE — Telephone Encounter (Signed)
-----   Message from Olam Ned sent at 05/22/2024  1:49 PM EDT ----- Please let her know PT/INR is in goal range.  Continue current dose.  She should have a follow-up lab 05/26/2024.

## 2024-05-22 NOTE — Telephone Encounter (Signed)
 Patient gave verbal understanding and had no further questions or concerns

## 2024-05-23 ENCOUNTER — Telehealth: Payer: Self-pay | Admitting: Family Medicine

## 2024-05-23 DIAGNOSIS — E7800 Pure hypercholesterolemia, unspecified: Secondary | ICD-10-CM

## 2024-05-23 MED ORDER — ROSUVASTATIN CALCIUM 40 MG PO TABS: 40.0000 mg | ORAL_TABLET | Freq: Every day | ORAL | 2 refills | Status: DC

## 2024-05-23 NOTE — Telephone Encounter (Signed)
 Copied from CRM [id number]. Topic: Clinical - Medication Refill >> May 23, 2024 10:54 AM Everette C wrote: Medication: rosuvastatin  (CRESTOR ) 40 MG tablet [[id number]]  Has the patient contacted their pharmacy? Yes (Agent: If no, request that the patient contact the pharmacy for the refill. If patient does not wish to contact the pharmacy document the reason why and proceed with request.) (Agent: If yes, when and what did the pharmacy advise?)  This is the patient's preferred pharmacy:  [id number] - [place], [place] - [address] AT [facility] [address] [place] [address] Phone: [phone] Fax: [phone]  Is this the correct pharmacy for this prescription? Yes If no, delete pharmacy and type the correct one.   Has the prescription been filled recently? Yes  Is the patient out of the medication? No  Has the patient been seen for an appointment in the last year OR does the patient have an upcoming appointment? Yes  Can we respond through MyChart? No  Agent: Please be advised that Rx refills may take up to 3 business days. We ask that you follow-up with your pharmacy.

## 2024-05-26 ENCOUNTER — Inpatient Hospital Stay

## 2024-05-26 DIAGNOSIS — D696 Thrombocytopenia, unspecified: Secondary | ICD-10-CM

## 2024-05-26 DIAGNOSIS — D693 Immune thrombocytopenic purpura: Secondary | ICD-10-CM | POA: Diagnosis not present

## 2024-05-26 LAB — CBC WITH DIFFERENTIAL (CANCER CENTER ONLY)
Abs Immature Granulocytes: 0.01 K/uL (ref 0.00–0.07)
Basophils Absolute: 0 K/uL (ref 0.0–0.1)
Basophils Relative: 1 %
Eosinophils Absolute: 0.1 K/uL (ref 0.0–0.5)
Eosinophils Relative: 4 %
HCT: 34.9 % — ABNORMAL LOW (ref 36.0–46.0)
Hemoglobin: 11.6 g/dL — ABNORMAL LOW (ref 12.0–15.0)
Immature Granulocytes: 0 %
Lymphocytes Relative: 26 %
Lymphs Abs: 1 K/uL (ref 0.7–4.0)
MCH: 27.8 pg (ref 26.0–34.0)
MCHC: 33.2 g/dL (ref 30.0–36.0)
MCV: 83.5 fL (ref 80.0–100.0)
Monocytes Absolute: 0.3 K/uL (ref 0.1–1.0)
Monocytes Relative: 8 %
Neutro Abs: 2.3 K/uL (ref 1.7–7.7)
Neutrophils Relative %: 61 %
Platelet Count: 62 K/uL — ABNORMAL LOW (ref 150–400)
RBC: 4.18 MIL/uL (ref 3.87–5.11)
RDW: 14.1 % (ref 11.5–15.5)
WBC Count: 3.7 K/uL — ABNORMAL LOW (ref 4.0–10.5)
nRBC: 0 % (ref 0.0–0.2)

## 2024-05-26 LAB — PROTIME-INR
INR: 3.5 — ABNORMAL HIGH (ref 0.8–1.2)
Prothrombin Time: 36.4 s — ABNORMAL HIGH (ref 11.4–15.2)

## 2024-05-27 ENCOUNTER — Other Ambulatory Visit: Payer: Self-pay

## 2024-05-27 DIAGNOSIS — D696 Thrombocytopenia, unspecified: Secondary | ICD-10-CM

## 2024-05-28 ENCOUNTER — Telehealth: Payer: Self-pay

## 2024-05-28 NOTE — Telephone Encounter (Signed)
 Per GBS, the patient should hold her Coumadin  and aspirin  and return for repeat laboratory testing on August 14th. The patient has agreed to come in, and the order has been placed.

## 2024-05-29 ENCOUNTER — Inpatient Hospital Stay

## 2024-05-29 ENCOUNTER — Encounter: Payer: Self-pay | Admitting: Oncology

## 2024-05-29 ENCOUNTER — Ambulatory Visit: Payer: Self-pay | Admitting: Oncology

## 2024-05-29 DIAGNOSIS — Z7901 Long term (current) use of anticoagulants: Secondary | ICD-10-CM

## 2024-05-29 DIAGNOSIS — D693 Immune thrombocytopenic purpura: Secondary | ICD-10-CM | POA: Diagnosis not present

## 2024-05-29 DIAGNOSIS — D696 Thrombocytopenia, unspecified: Secondary | ICD-10-CM

## 2024-05-29 LAB — PROTIME-INR
INR: 2.8 — ABNORMAL HIGH (ref 0.8–1.2)
Prothrombin Time: 31.2 s — ABNORMAL HIGH (ref 11.4–15.2)

## 2024-05-29 NOTE — Telephone Encounter (Signed)
 Notified patient that INR is good. Change warfarin to 5 mg daily, except 2.5 mg on MWF. Recheck INR On 8/21 at 0945

## 2024-05-29 NOTE — Telephone Encounter (Signed)
-----   Message from Arley Hof sent at 05/29/2024  4:37 PM EDT ----- Please call patient, the PT/INR is now in goal range, resume Coumadin  at a dose of 2.5 mg Monday, Wednesday, Friday, 5 mg other days, repeat PT/INR in 1 week  ----- Message ----- From: Rebecka, Lab In [place] Sent: 05/29/2024  10:41 AM EDT To: Arley KATHEE Hof, MD

## 2024-06-03 ENCOUNTER — Ambulatory Visit: Admitting: Cardiology

## 2024-06-05 ENCOUNTER — Inpatient Hospital Stay

## 2024-06-05 ENCOUNTER — Ambulatory Visit: Payer: Self-pay | Admitting: Oncology

## 2024-06-05 DIAGNOSIS — D693 Immune thrombocytopenic purpura: Secondary | ICD-10-CM | POA: Diagnosis not present

## 2024-06-05 DIAGNOSIS — Z7901 Long term (current) use of anticoagulants: Secondary | ICD-10-CM

## 2024-06-05 LAB — PROTIME-INR
INR: 2.6 — ABNORMAL HIGH (ref 0.8–1.2)
Prothrombin Time: 28.8 s — ABNORMAL HIGH (ref 11.4–15.2)

## 2024-06-05 NOTE — Telephone Encounter (Signed)
-----   Message from Arley Hof sent at 06/05/2024  2:04 PM EDT ----- Please call patient with the PT/INR is therapeutic, repeat PT/INR with next visit as scheduled  ----- Message ----- From: Interface, Lab In [place] Sent: 06/05/2024  10:07 AM EDT To: Arley KATHEE Hof, MD

## 2024-06-05 NOTE — Telephone Encounter (Signed)
 Patient voiced understanding, no further questions at this time.

## 2024-06-06 ENCOUNTER — Encounter

## 2024-06-13 ENCOUNTER — Inpatient Hospital Stay

## 2024-06-13 ENCOUNTER — Inpatient Hospital Stay (HOSPITAL_BASED_OUTPATIENT_CLINIC_OR_DEPARTMENT_OTHER): Admitting: Oncology

## 2024-06-13 ENCOUNTER — Other Ambulatory Visit: Payer: Self-pay | Admitting: *Deleted

## 2024-06-13 VITALS — BP 134/80 | HR 72 | Temp 98.2°F | Resp 18 | Ht 66.0 in | Wt 185.3 lb

## 2024-06-13 DIAGNOSIS — D696 Thrombocytopenia, unspecified: Secondary | ICD-10-CM | POA: Diagnosis not present

## 2024-06-13 DIAGNOSIS — D693 Immune thrombocytopenic purpura: Secondary | ICD-10-CM | POA: Diagnosis not present

## 2024-06-13 DIAGNOSIS — Z7901 Long term (current) use of anticoagulants: Secondary | ICD-10-CM

## 2024-06-13 LAB — CBC WITH DIFFERENTIAL (CANCER CENTER ONLY)
Abs Immature Granulocytes: 0 K/uL (ref 0.00–0.07)
Basophils Absolute: 0 K/uL (ref 0.0–0.1)
Basophils Relative: 1 %
Eosinophils Absolute: 0.1 K/uL (ref 0.0–0.5)
Eosinophils Relative: 3 %
HCT: 32.5 % — ABNORMAL LOW (ref 36.0–46.0)
Hemoglobin: 10.7 g/dL — ABNORMAL LOW (ref 12.0–15.0)
Immature Granulocytes: 0 %
Lymphocytes Relative: 20 %
Lymphs Abs: 0.8 K/uL (ref 0.7–4.0)
MCH: 27 pg (ref 26.0–34.0)
MCHC: 32.9 g/dL (ref 30.0–36.0)
MCV: 82.1 fL (ref 80.0–100.0)
Monocytes Absolute: 0.3 K/uL (ref 0.1–1.0)
Monocytes Relative: 7 %
Neutro Abs: 2.7 K/uL (ref 1.7–7.7)
Neutrophils Relative %: 69 %
Platelet Count: 114 K/uL — ABNORMAL LOW (ref 150–400)
RBC: 3.96 MIL/uL (ref 3.87–5.11)
RDW: 14.1 % (ref 11.5–15.5)
WBC Count: 3.8 K/uL — ABNORMAL LOW (ref 4.0–10.5)
nRBC: 0 % (ref 0.0–0.2)

## 2024-06-13 LAB — PROTIME-INR
INR: 2.8 — ABNORMAL HIGH (ref 0.8–1.2)
Prothrombin Time: 30.8 s — ABNORMAL HIGH (ref 11.4–15.2)

## 2024-06-13 MED ORDER — WARFARIN SODIUM 5 MG PO TABS: 5.0000 mg | ORAL_TABLET | Freq: Every day | ORAL | 0 refills | Status: DC

## 2024-06-13 NOTE — Progress Notes (Signed)
 [facility] OFFICE PROGRESS NOTE   Diagnosis: ITP, antiphospholipid syndrome  INTERVAL HISTORY:   Carol Thomas returns as scheduled.  She continues Coumadin  anticoagulation.  No stroke symptoms.  She had a skin lesion removed from the left parietal scalp yesterday.  She developed bleeding at the surgical site beginning approximately 3 hours following the procedure.  The bleeding resolved with pressure and returned a few hours later.  The bleeding again resolved with pressure and has not recurred.  No other bleeding.  No fever or night sweats.  Objective:  Vital signs in last 24 hours:  Blood pressure 134/80, pulse 72, temperature 98.2 F (36.8 C), temperature source Temporal, resp. rate 18, height 5' 6 (1.676 m), weight 185 lb 4.8 oz (84.1 kg), SpO2 100%.  Resp: Lungs clear bilaterally Cardio: Regular rate and rhythm GI: No hepatosplenomegaly Vascular: No leg edema  Skin: Small ecchymoses over the trunk and extremities, scalp biopsy site at the left parietal lesion with an overlying clot and surrounding dried blood, no active bleeding   Lab Results:  Lab Results  Component Value Date   WBC 3.8 (L) 06/13/2024   HGB 10.7 (L) 06/13/2024   HCT 32.5 (L) 06/13/2024   MCV 82.1 06/13/2024   PLT 114 (L) 06/13/2024   NEUTROABS 2.7 06/13/2024    CMP  Lab Results  Component Value Date   NA 134 (L) 02/03/2024   K 3.5 02/03/2024   CL 99 02/03/2024   CO2 24 02/03/2024   GLUCOSE 104 (H) 02/03/2024   BUN 14 02/03/2024   CREATININE 1.02 (H) 02/03/2024   CALCIUM  9.6 02/03/2024   PROT 6.7 04/04/2024   ALBUMIN 4.3 04/04/2024   AST 34 04/04/2024   ALT 44 (H) 04/04/2024   ALKPHOS 90 04/04/2024   BILITOT 0.6 04/04/2024   GFRNONAA >60 02/03/2024   GFRAA 72 07/28/2020    Lab Results  Component Value Date   INR 2.8 (H) 06/13/2024   LABPROT 30.8 (H) 06/13/2024    Medications: I have reviewed the patient's current  medications.   Assessment/Plan: Thrombocytopenia-progressive 09/20/2022 Admission 09/20/2022 with severe thrombocytopenia, prednisone  09/20/2022, Solu-Medrol  1 mg/kilogram daily start 09/20/2022 Bone marrow biopsy 09/21/2022-hypercellular marrow for age, increased megakaryocytes, no evidence of a lymphoproliferative disorder Prednisone  80 mg daily 09/22/2022 Prednisone  40 mg daily 09/26/2022 Prednisone  30 mg daily 10/04/2022 Prednisone  20 mg daily 11/01/2022 Prednisone  15 mg daily 11/23/2022 Prednisone  10 mg daily 01/03/2023 Prednisone  5 mg daily 01/26/2023 Prednisone  2.5 mg daily 02/22/2023 Prednisone  5 mg daily 03/14/2023 Prednisone  5 alternating with 2.5 mg daily 04/03/2023 Prednisone  40 mg daily X 3, then 20 mg daily start 05/24/2023 Prednisone  decreased to 10 mg daily 06/27/2023 Prednisone  decreased to 10 mg alternating with 5 mg daily 07/04/2023 Cycle 1 rituximab  07/04/2023 Cycle 2 rituximab  07/11/2023 Cycle 3 rituximab  07/18/2023 Prednisone  decreased to 5 mg daily 07/25/2023 Cycle 4 rituximab  07/25/2023 Prednisone  decreased to 5 mg every other day 08/15/2023 Prednisone  discontinued 09/05/2023 Leukopenia History of melanoma located left leg below the knee status post post excision 1998-completed an immunotherapy trial at [facility] Eosinophilic esophagitis Hypertension Anxiety History of colon polyps Hypercholesterolemia  Elevated IgM level Polyclonal hypergammaglobulinemia 02/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 Horiuchi is maintained on Coumadin  anticoagulation after a recent CVA.  Aspirin  is now being held due to coexisting thrombocytopenia.  The PT/INR is  therapeutic.  She will continue Coumadin  at the current dose.  She will return for a  lab visit in 2 weeks and an office visit in 4 weeks.  She had bleeding at the left parietal scalp following a dermatology biopsy yesterday.  She will call for recurrent bleeding.  I recommended she did not scrub the clot/dried blood due to the risk of repeat bleeding. The hemoglobin is lower today.  This may be related to the post biopsy bleeding yesterday.  We will check a CBC when she returns in 2 weeks. Arley Hof, MD  06/13/2024  9:59 AM

## 2024-06-27 ENCOUNTER — Ambulatory Visit: Payer: Self-pay | Admitting: Oncology

## 2024-06-27 ENCOUNTER — Inpatient Hospital Stay: Attending: Oncology

## 2024-06-27 DIAGNOSIS — D696 Thrombocytopenia, unspecified: Secondary | ICD-10-CM | POA: Insufficient documentation

## 2024-06-27 DIAGNOSIS — E7800 Pure hypercholesterolemia, unspecified: Secondary | ICD-10-CM | POA: Insufficient documentation

## 2024-06-27 DIAGNOSIS — I10 Essential (primary) hypertension: Secondary | ICD-10-CM | POA: Insufficient documentation

## 2024-06-27 DIAGNOSIS — Z7901 Long term (current) use of anticoagulants: Secondary | ICD-10-CM | POA: Diagnosis not present

## 2024-06-27 DIAGNOSIS — D6862 Lupus anticoagulant syndrome: Secondary | ICD-10-CM | POA: Diagnosis not present

## 2024-06-27 DIAGNOSIS — Z85820 Personal history of malignant melanoma of skin: Secondary | ICD-10-CM | POA: Insufficient documentation

## 2024-06-27 DIAGNOSIS — Z8673 Personal history of transient ischemic attack (TIA), and cerebral infarction without residual deficits: Secondary | ICD-10-CM | POA: Diagnosis not present

## 2024-06-27 DIAGNOSIS — F419 Anxiety disorder, unspecified: Secondary | ICD-10-CM | POA: Insufficient documentation

## 2024-06-27 LAB — CBC WITH DIFFERENTIAL (CANCER CENTER ONLY)
Abs Immature Granulocytes: 0.01 K/uL (ref 0.00–0.07)
Basophils Absolute: 0 K/uL (ref 0.0–0.1)
Basophils Relative: 1 %
Eosinophils Absolute: 0.1 K/uL (ref 0.0–0.5)
Eosinophils Relative: 3 %
HCT: 32.9 % — ABNORMAL LOW (ref 36.0–46.0)
Hemoglobin: 10.9 g/dL — ABNORMAL LOW (ref 12.0–15.0)
Immature Granulocytes: 0 %
Lymphocytes Relative: 26 %
Lymphs Abs: 0.9 K/uL (ref 0.7–4.0)
MCH: 26.7 pg (ref 26.0–34.0)
MCHC: 33.1 g/dL (ref 30.0–36.0)
MCV: 80.4 fL (ref 80.0–100.0)
Monocytes Absolute: 0.3 K/uL (ref 0.1–1.0)
Monocytes Relative: 8 %
Neutro Abs: 2.2 K/uL (ref 1.7–7.7)
Neutrophils Relative %: 62 %
Platelet Count: 102 K/uL — ABNORMAL LOW (ref 150–400)
RBC: 4.09 MIL/uL (ref 3.87–5.11)
RDW: 14 % (ref 11.5–15.5)
WBC Count: 3.6 K/uL — ABNORMAL LOW (ref 4.0–10.5)
nRBC: 0 % (ref 0.0–0.2)

## 2024-06-27 LAB — PROTIME-INR
INR: 2.9 — ABNORMAL HIGH (ref 0.8–1.2)
Prothrombin Time: 31.8 s — ABNORMAL HIGH (ref 11.4–15.2)

## 2024-07-08 ENCOUNTER — Inpatient Hospital Stay (HOSPITAL_BASED_OUTPATIENT_CLINIC_OR_DEPARTMENT_OTHER): Admitting: Oncology

## 2024-07-08 ENCOUNTER — Inpatient Hospital Stay

## 2024-07-08 VITALS — BP 135/78 | HR 66 | Temp 97.8°F | Resp 18 | Ht 66.0 in | Wt 185.5 lb

## 2024-07-08 DIAGNOSIS — D649 Anemia, unspecified: Secondary | ICD-10-CM | POA: Diagnosis not present

## 2024-07-08 DIAGNOSIS — D696 Thrombocytopenia, unspecified: Secondary | ICD-10-CM

## 2024-07-08 DIAGNOSIS — Z7901 Long term (current) use of anticoagulants: Secondary | ICD-10-CM

## 2024-07-08 LAB — CBC WITH DIFFERENTIAL (CANCER CENTER ONLY)
Abs Immature Granulocytes: 0.01 K/uL (ref 0.00–0.07)
Basophils Absolute: 0 K/uL (ref 0.0–0.1)
Basophils Relative: 1 %
Eosinophils Absolute: 0.1 K/uL (ref 0.0–0.5)
Eosinophils Relative: 2 %
HCT: 33.7 % — ABNORMAL LOW (ref 36.0–46.0)
Hemoglobin: 10.7 g/dL — ABNORMAL LOW (ref 12.0–15.0)
Immature Granulocytes: 0 %
Lymphocytes Relative: 25 %
Lymphs Abs: 1 K/uL (ref 0.7–4.0)
MCH: 25.7 pg — ABNORMAL LOW (ref 26.0–34.0)
MCHC: 31.8 g/dL (ref 30.0–36.0)
MCV: 81 fL (ref 80.0–100.0)
Monocytes Absolute: 0.3 K/uL (ref 0.1–1.0)
Monocytes Relative: 8 %
Neutro Abs: 2.5 K/uL (ref 1.7–7.7)
Neutrophils Relative %: 64 %
Platelet Count: 134 K/uL — ABNORMAL LOW (ref 150–400)
RBC: 4.16 MIL/uL (ref 3.87–5.11)
RDW: 14.3 % (ref 11.5–15.5)
WBC Count: 4 K/uL (ref 4.0–10.5)
nRBC: 0 % (ref 0.0–0.2)

## 2024-07-08 LAB — PROTIME-INR
INR: 3.8 — ABNORMAL HIGH (ref 0.8–1.2)
Prothrombin Time: 39.4 s — ABNORMAL HIGH (ref 11.4–15.2)

## 2024-07-08 NOTE — Progress Notes (Signed)
 [facility] OFFICE PROGRESS NOTE   Thomas: Thrombocytopenia, CVA  INTERVAL HISTORY:   Carol Thomas returns as scheduled.  She continues Coumadin .  No stroke symptoms.  She scratched her nose and had bleeding for approximately 45 minutes.  No other bleeding.  She just returned from a cruise.  She had nausea/vomiting for 1 day and her husband had diarrhea while on the cruise.  Objective:  Vital signs in last 24 hours:  Blood pressure 135/78, pulse 66, temperature 97.8 F (36.6 C), temperature source Temporal, resp. rate 18, height 5' 6 (1.676 m), weight 185 lb 8 oz (84.1 kg), SpO2 100%.    Resp: Lungs clear bilaterally Cardio: Regular rate and rhythm GI: No hepatosplenomegaly Vascular: No leg edema  Skin: Small ecchymoses over the legs   Lab Results:  Lab Results  Component Value Date   WBC 4.0 07/08/2024   HGB 10.7 (L) 07/08/2024   HCT 33.7 (L) 07/08/2024   MCV 81.0 07/08/2024   PLT 134 (L) 07/08/2024   NEUTROABS 2.5 07/08/2024    CMP  Lab Results  Component Value Date   NA 134 (L) 02/03/2024   K 3.5 02/03/2024   CL 99 02/03/2024   CO2 24 02/03/2024   GLUCOSE 104 (H) 02/03/2024   BUN 14 02/03/2024   CREATININE 1.02 (H) 02/03/2024   CALCIUM  9.6 02/03/2024   PROT 6.7 04/04/2024   ALBUMIN 4.3 04/04/2024   AST 34 04/04/2024   ALT 44 (H) 04/04/2024   ALKPHOS 90 04/04/2024   BILITOT 0.6 04/04/2024   GFRNONAA >60 02/03/2024   GFRAA 72 07/28/2020     Lab Results  Component Value Date   INR 3.8 (H) 07/08/2024   LABPROT 39.4 (H) 07/08/2024   Medications: I have reviewed the patient's current medications.   Assessment/Plan: Thrombocytopenia-progressive 09/20/2022 Admission 09/20/2022 with severe thrombocytopenia, prednisone  09/20/2022, Solu-Medrol  1 mg/kilogram daily start 09/20/2022 Bone marrow biopsy 09/21/2022-hypercellular marrow for age, increased megakaryocytes, no evidence of a lymphoproliferative disorder Prednisone  80 mg daily  09/22/2022 Prednisone  40 mg daily 09/26/2022 Prednisone  30 mg daily 10/04/2022 Prednisone  20 mg daily 11/01/2022 Prednisone  15 mg daily 11/23/2022 Prednisone  10 mg daily 01/03/2023 Prednisone  5 mg daily 01/26/2023 Prednisone  2.5 mg daily 02/22/2023 Prednisone  5 mg daily 03/14/2023 Prednisone  5 alternating with 2.5 mg daily 04/03/2023 Prednisone  40 mg daily X 3, then 20 mg daily start 05/24/2023 Prednisone  decreased to 10 mg daily 06/27/2023 Prednisone  decreased to 10 mg alternating with 5 mg daily 07/04/2023 Cycle 1 rituximab  07/04/2023 Cycle 2 rituximab  07/11/2023 Cycle 3 rituximab  07/18/2023 Prednisone  decreased to 5 mg daily 07/25/2023 Cycle 4 rituximab  07/25/2023 Prednisone  decreased to 5 mg every other day 08/15/2023 Prednisone  discontinued 09/05/2023 Leukopenia History of melanoma located left leg below the knee status post post excision 1998-completed an immunotherapy trial at [facility] Eosinophilic esophagitis Hypertension Anxiety History of colon polyps Hypercholesterolemia  Elevated IgM level Polyclonal hypergammaglobulinemia 02/02/2024: Left cerebellar and left occipital CVAs-aspirin , aspirin  placed on hold when she started Coumadin  anticoagulation Positive lupus anticoagulant 05/08/2024; 05/13/2024 beta-2  glycoprotein IgG/IgM/IgA normal, cardiolipin antibodies IgG and IgA normal, IgM indeterminate range      Disposition: Carol Thomas appears unchanged.  The platelet count remains adequate.  She remains off of prednisone .  She is maintained on Coumadin  anticoagulation after a CVA and a possible Thomas of antiphospholipid syndrome.  The PT/INR is supratherapeutic today.  She will hold Coumadin  for the next 2 days and then resume Coumadin  at a lower dose.  She will return for a PT/INR in 1 week.  She will return for an office visit, CBC, PT/INR, and repeat lupus anticoagulant panel in 4 weeks.  Arley Hof, MD  07/08/2024  10:51 AM

## 2024-07-10 ENCOUNTER — Telehealth: Payer: Self-pay

## 2024-07-10 ENCOUNTER — Telehealth: Payer: Self-pay | Admitting: Neurology

## 2024-07-10 NOTE — Telephone Encounter (Signed)
 The patient's appointment has been rescheduled to October 24, 2023, to accommodate the provider's updated office hours. The patient will received an updated calendar at her next office visit.

## 2024-07-10 NOTE — Telephone Encounter (Signed)
 [facility] cancelation

## 2024-07-17 ENCOUNTER — Inpatient Hospital Stay: Attending: Oncology

## 2024-07-17 ENCOUNTER — Ambulatory Visit: Payer: Self-pay | Admitting: Oncology

## 2024-07-17 DIAGNOSIS — F419 Anxiety disorder, unspecified: Secondary | ICD-10-CM | POA: Insufficient documentation

## 2024-07-17 DIAGNOSIS — E7800 Pure hypercholesterolemia, unspecified: Secondary | ICD-10-CM | POA: Diagnosis not present

## 2024-07-17 DIAGNOSIS — D696 Thrombocytopenia, unspecified: Secondary | ICD-10-CM | POA: Insufficient documentation

## 2024-07-17 DIAGNOSIS — Z85820 Personal history of malignant melanoma of skin: Secondary | ICD-10-CM | POA: Diagnosis not present

## 2024-07-17 DIAGNOSIS — D6862 Lupus anticoagulant syndrome: Secondary | ICD-10-CM | POA: Diagnosis not present

## 2024-07-17 DIAGNOSIS — I10 Essential (primary) hypertension: Secondary | ICD-10-CM | POA: Insufficient documentation

## 2024-07-17 DIAGNOSIS — D72819 Decreased white blood cell count, unspecified: Secondary | ICD-10-CM | POA: Diagnosis not present

## 2024-07-17 DIAGNOSIS — Z7901 Long term (current) use of anticoagulants: Secondary | ICD-10-CM

## 2024-07-17 LAB — PROTIME-INR
INR: 2.3 — ABNORMAL HIGH (ref 0.8–1.2)
Prothrombin Time: 26.5 s — ABNORMAL HIGH (ref 11.4–15.2)

## 2024-07-24 ENCOUNTER — Other Ambulatory Visit: Payer: Self-pay | Admitting: Family Medicine

## 2024-07-24 DIAGNOSIS — Z9189 Other specified personal risk factors, not elsewhere classified: Secondary | ICD-10-CM

## 2024-07-24 DIAGNOSIS — M85852 Other specified disorders of bone density and structure, left thigh: Secondary | ICD-10-CM

## 2024-08-01 ENCOUNTER — Ambulatory Visit (INDEPENDENT_AMBULATORY_CARE_PROVIDER_SITE_OTHER): Admitting: Neurology

## 2024-08-01 ENCOUNTER — Encounter: Payer: Self-pay | Admitting: Neurology

## 2024-08-01 VITALS — BP 168/95 | HR 73 | Ht 66.0 in | Wt 187.8 lb

## 2024-08-01 DIAGNOSIS — Z8673 Personal history of transient ischemic attack (TIA), and cerebral infarction without residual deficits: Secondary | ICD-10-CM

## 2024-08-01 DIAGNOSIS — Z862 Personal history of diseases of the blood and blood-forming organs and certain disorders involving the immune mechanism: Secondary | ICD-10-CM

## 2024-08-01 DIAGNOSIS — I10 Essential (primary) hypertension: Secondary | ICD-10-CM | POA: Diagnosis not present

## 2024-08-01 NOTE — Progress Notes (Signed)
 Patient: Carol Thomas Date of Birth: [birth year]  Reason for Visit: Stroke Clinic Follow Up  History from: Patient  Primary Neurologist: Rosemarie   ASSESSMENT AND PLAN 68 y.o. year old female with acute left cerebellar infarct and subacute left parietal white matter infarct in April 2025.  Etiology small vessel disease. CVAs likely do not explain left leg weakness and silent infarcts.  History of ITP, follows with hematology, no Plavix , on aspirin  81 mg daily, platelets dropped to 62.  Vascular risk factors: HTN, HLD. In July 2025, Hematology diagnosed her with probable antiphospholipid syndrome and was started on Coumadin . 30 day cardiac monitor study was unremarkable.   - Doing overall very well, has minimal gait instability and is working with a trainer on that - Now on Coumadin  managed by hematology, planning to have repeat labs for possible antiphospholipid syndrome next week - Strict management of vascular risk factors with a goal BP less than 130/90, A1c less than 7.0, LDL less than 70 for secondary stroke prevention - Continue close follow-up with primary care and hematology -I recommended an HST to rule out OSA, not interested, advised to further discuss with PCP.  Untreated OSA can be a risk factor for stroke - Return here as needed  HISTORY OF PRESENT ILLNESS: Today 08/01/24 08/01/24 SS: Patient appointment with Dr. Rosemarie rescheduled and placed my my scheduled.  She has followed with hematology, felt to possibly have antiphospholipid syndrome, having repeat labs next week.  Has been started on Coumadin .  No longer on aspirin . 30 day Cardiac monitor was unremarkable. Doing very well overall, some mild gait instability, works out with trainer, increasing her endurance. In June LDL 74, remains on Crestor , has been increased to 40.  BP was increased today 168/95, checks at home 132/78. Always higher in the office. No longer on aspirin . Her platelet level was better.   03/18/24 SS: Has been  following with hematology for ITP. 02/28/24 platelet was 62. Had a rash to her knee. Plan to remain on aspirin  unless platelets drop below 50.  Completed cardiac monitor, awaiting results. Rash to left knee has cleared, after antibiotic. Bruises easily at baseline. Has fatigue, multifactorial with ITP and esophagitis. Has noted a few mild headaches thinks are sinus related to left face side of face above her eye.  Has not needed to take anything. Has not noted any vision change, sees floaters at baseline. No weakness on the left leg. She drives is independent. Lives with her husband. She is going to [place]  end of this week, anxious about travel with ITP, would like CBC checked. BP today 158/82, reports always up in office, running in the 130's/80's, she was taken off Losartan  due to BP well maintained. Her son passed away suddenly in 21-Jan-2024, they now have custody of her 2 grandsons 48 and 40.   HISTORY  Presented to the ER 02/02/24 for left leg weakness after her leg gave out during an episode of vertigo.  Her workup was unremarkable and she was discharged home.  She returned later that night with recurrence of left leg weakness when trying to get up and her leg gave out.  Found to have acute left cerebellar infarct and subacute left parietal white matter infarct.  Etiology small vessel disease. Dr. Rosemarie felt CVA likely cannot explain leg weakness and silent infarcts.  History of ITP, no Plavix , started on aspirin  81 mg daily. Platelet 104. Needs cardiac monitor to look for AFIB.   -Code stroke CT head no  acute abnormality. - MRI of the brain small acute left cerebellar infarct.  Probably subacute small infarct in left parieto-occipital region - MRA moderate left PCA P1/P2 junction stenosis - 2D echo EF 55 to 60%.  Recommended 30-day cardiac monitor. - LDL 76, increased Crestor  to 20 mg daily  - A1c 5.2 - No antithrombotic prior to admission, placed on aspirin  81 mg daily due to ITP  REVIEW OF  SYSTEMS: Out of a complete 14 system review of symptoms, the patient complains only of the following symptoms, and all other reviewed systems are negative.  See HPI  ALLERGIES: Allergies  Allergen Reactions   Other Other (See Comments)    Patient has Eosinophilic Esophagitis = Heartburn/acid reflux Eosinophilic esophagitis (EoE) is an inflammation of the esophagus (the tube connecting the mouth to the stomach), caused by a specific white blood cell - the eosinophil. NO eggs, juices, hot dogs, milk, cucumbers, etc......    HOME MEDICATIONS: Outpatient Medications Prior to Visit  Medication Sig Dispense Refill   acetaminophen  (TYLENOL ) 500 MG tablet Take 1,000 mg by mouth every 6 (six) hours as needed for mild pain (pain score 1-3), fever or headache.     alendronate  (FOSAMAX ) 70 MG tablet TAKE 1 TABLET EVERY 7 DAYS WITH A FULL GLASS OF WATER ON AN EMPTY STOMACH 12 tablet 0   amLODipine  (NORVASC ) 10 MG tablet Take 1 tablet (10 mg total) by mouth daily. 90 tablet 1   Cholecalciferol  (VITAMIN D3) 50 MCG (2000 UT) TABS Take 2,000 Units by mouth in the morning.     citalopram  (CELEXA ) 20 MG tablet Take 1 tablet (20 mg total) by mouth daily. 90 tablet 1   Coenzyme Q10 (COQ10) 100 MG CAPS Take 100 mg by mouth daily.     pantoprazole  (PROTONIX ) 40 MG tablet Take 40 mg by mouth daily.     rosuvastatin  (CRESTOR ) 40 MG tablet Take 1 tablet (40 mg total) by mouth daily. 30 tablet 2   SYSTANE COMPLETE PF 0.6 % SOLN Place 1 drop into both eyes 3 (three) times daily as needed (for dryness or irritation).     warfarin (COUMADIN ) 5 MG tablet Take 1 tablet (5 mg total) by mouth daily. Take 2.5 mg on MWF (Patient taking differently: Take 2.5 mg by mouth daily. Take 5 mg on MWF) 90 tablet 0   No facility-administered medications prior to visit.    PAST MEDICAL HISTORY: Past Medical History:  Diagnosis Date   Adenomatous colon polyp 11/16/2008   Dr. Lennard at [facility]   Anxiety    Cancer [facility]) 10/16/1996    Melanoma-Left leg   Hypertension    Idiopathic thrombocytopenic purpura (ITP) (HCC)    Osteopenia 10/16/2012   T score of -2.2 FRAX 8%/1.1%   Vitamin D  deficiency 10/16/2012   vitamin D  level 10    PAST SURGICAL HISTORY: Past Surgical History:  Procedure Laterality Date   APPENDECTOMY     ESOPHAGOGASTRODUODENOSCOPY  05/24/2017   Erythema in the middle third of the esphagus and in lower third.-biopised erythematous mucosa in the antrum, normal examined duodenum   Melanoma excised     TOTAL VAGINAL HYSTERECTOMY  1997   TAH   TUBAL LIGATION     UPPER GI ENDOSCOPY  04/03/2022   Dr. Beverlie gastritis, possible eosinophilic esophagitis    FAMILY HISTORY: Family History  Problem Relation Age of Onset   Hypertension Mother    Diabetes Mother    Osteoporosis Mother    Thyroid  disease Mother  COPD Mother    Hyperlipidemia Mother    Heart attack Mother 62   Anxiety disorder Mother    Arthritis Mother    Heart disease Mother    Kidney disease Mother    Hyperlipidemia Sister    Stroke Daughter        during pregnancy   Depression Daughter    Bipolar disorder Son    Colon cancer Maternal Uncle        40's    SOCIAL HISTORY: Social History   Socioeconomic History   Marital status: Married    Spouse name: Not on file   Number of children: 2   Years of education: Not on file   Highest education level: Not on file  Occupational History   Occupation: [employer] features editor)  Tobacco Use   Smoking status: Never   Smokeless tobacco: Never  Substance and Sexual Activity   Alcohol use: No   Drug use: No   Sexual activity: Not Currently    Birth control/protection: Surgical  Other Topics Concern   Not on file  Social History Narrative   Married.  Lives with husband, 1 dog, 1 cat.   Son and 2 grandsons (10 and 58) are currently living with her (their mom passed suddenly, MI at 58)).   Mother-in-law has vascular dementia, and is now in a nursing home.       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         Updated 08/2023   Social Drivers of Health   Financial Resource Strain: Not on file  Food Insecurity: No Food Insecurity (02/03/2024)   Hunger Vital Sign    Worried About Running Out of Food in the Last Year: Never true    Ran Out of Food in the Last Year: Never true  Transportation Needs: No Transportation Needs (02/03/2024)   PRAPARE - Administrator, Civil Service (Medical): No    Lack of Transportation (Non-Medical): No  Physical Activity: Not on file  Stress: Not on file  Social Connections: Moderately Isolated (02/03/2024)   Social Connection and Isolation Panel    Frequency of Communication with Friends and Family: Twice a week    Frequency of Social Gatherings with Friends and Family: Once a week    Attends Religious Services: Never    Database administrator or Organizations: No    Attends Banker Meetings: Never    Marital Status: Married  Catering manager Violence: Not At Risk (02/03/2024)   Humiliation, Afraid, Rape, and Kick questionnaire    Fear of Current or Ex-Partner: No    Emotionally Abused: No    Physically Abused: No    Sexually Abused: No   PHYSICAL EXAM  Vitals:   08/01/24 0957  BP: (!) 168/95  Pulse: 73  Weight: 187 lb 12.8 oz (85.2 kg)  Height: 5' 6 (1.676 m)    Body mass index is 30.31 kg/m.  Generalized: Well developed, in no acute distress  Neurological examination  Mentation: Alert oriented to time, place, history taking. Follows all commands speech and language fluent Cranial nerve II-XII: Pupils were equal round reactive to light. Extraocular movements were full, visual field were full on confrontational test. Facial sensation and strength were normal.  Head turning and shoulder shrug  were normal and symmetric. Motor: The motor testing reveals 5 over 5 strength of all 4 extremities. Good symmetric motor tone is noted  throughout.  Sensory: Sensory testing  is intact to soft touch on all 4 extremities. No evidence of extinction is noted.  Coordination: Cerebellar testing reveals good finger-nose-finger and heel-to-shin bilaterally.  Gait and station: Gait is normal in flip flops. Reflexes: Deep tendon reflexes are symmetric and normal bilaterally.   DIAGNOSTIC DATA (LABS, IMAGING, TESTING) - I reviewed patient records, labs, notes, testing and imaging myself where available.  Lab Results  Component Value Date   WBC 4.0 07/08/2024   HGB 10.7 (L) 07/08/2024   HCT 33.7 (L) 07/08/2024   MCV 81.0 07/08/2024   PLT 134 (L) 07/08/2024      Component Value Date/Time   NA 134 (L) 02/03/2024 0400   NA 139 02/13/2023 0943   K 3.5 02/03/2024 0400   CL 99 02/03/2024 0400   CO2 24 02/03/2024 0400   GLUCOSE 104 (H) 02/03/2024 0400   BUN 14 02/03/2024 0400   BUN 12 02/13/2023 0943   CREATININE 1.02 (H) 02/03/2024 0400   CREATININE 1.12 (H) 07/04/2023 0950   CREATININE 1.06 (H) 10/22/2017 0823   CALCIUM  9.6 02/03/2024 0400   PROT 6.7 04/04/2024 0921   ALBUMIN 4.3 04/04/2024 0921   AST 34 04/04/2024 0921   AST 14 (L) 07/04/2023 0950   ALT 44 (H) 04/04/2024 0921   ALT 14 07/04/2023 0950   ALKPHOS 90 04/04/2024 0921   BILITOT 0.6 04/04/2024 0921   BILITOT 1.0 07/04/2023 0950   GFRNONAA >60 02/03/2024 0400   GFRNONAA 54 (L) 07/04/2023 0950   GFRAA 72 07/28/2020 0843   Lab Results  Component Value Date   CHOL 142 04/04/2024   HDL 55 04/04/2024   LDLCALC 74 04/04/2024   TRIG 62 04/04/2024   CHOLHDL 2.6 04/04/2024   Lab Results  Component Value Date   HGBA1C 5.2 02/03/2024   Lab Results  Component Value Date   VITAMINB12 214 08/23/2022   Lab Results  Component Value Date   TSH 4.160 04/04/2024   Lauraine Born, AGNP-C, DNP 08/01/2024, 11:23 AM [facility] [address] [place], [place] [address] [phone]

## 2024-08-01 NOTE — Patient Instructions (Signed)
 Continue follow up with hematology  Strict management of vascular risk factors with a goal BP less than 130/90, A1c less than 7.0, LDL less than 70 for secondary stroke prevention Consider evaluation for sleep apnea, please discuss with your primary care  Return here as needed

## 2024-08-02 NOTE — Progress Notes (Signed)
 I agree with the above plan

## 2024-08-05 ENCOUNTER — Inpatient Hospital Stay

## 2024-08-05 ENCOUNTER — Inpatient Hospital Stay (HOSPITAL_BASED_OUTPATIENT_CLINIC_OR_DEPARTMENT_OTHER): Admitting: Oncology

## 2024-08-05 ENCOUNTER — Other Ambulatory Visit: Payer: Self-pay | Admitting: *Deleted

## 2024-08-05 VITALS — BP 133/78 | HR 81 | Temp 97.7°F | Resp 18 | Ht 66.0 in | Wt 185.7 lb

## 2024-08-05 DIAGNOSIS — D696 Thrombocytopenia, unspecified: Secondary | ICD-10-CM

## 2024-08-05 DIAGNOSIS — D649 Anemia, unspecified: Secondary | ICD-10-CM

## 2024-08-05 DIAGNOSIS — Z7901 Long term (current) use of anticoagulants: Secondary | ICD-10-CM

## 2024-08-05 LAB — CBC WITH DIFFERENTIAL (CANCER CENTER ONLY)
Abs Immature Granulocytes: 0.02 K/uL (ref 0.00–0.07)
Basophils Absolute: 0 K/uL (ref 0.0–0.1)
Basophils Relative: 1 %
Eosinophils Absolute: 0.1 K/uL (ref 0.0–0.5)
Eosinophils Relative: 3 %
HCT: 33 % — ABNORMAL LOW (ref 36.0–46.0)
Hemoglobin: 10.3 g/dL — ABNORMAL LOW (ref 12.0–15.0)
Immature Granulocytes: 1 %
Lymphocytes Relative: 19 %
Lymphs Abs: 0.7 K/uL (ref 0.7–4.0)
MCH: 24.2 pg — ABNORMAL LOW (ref 26.0–34.0)
MCHC: 31.2 g/dL (ref 30.0–36.0)
MCV: 77.5 fL — ABNORMAL LOW (ref 80.0–100.0)
Monocytes Absolute: 0.2 K/uL (ref 0.1–1.0)
Monocytes Relative: 6 %
Neutro Abs: 2.6 K/uL (ref 1.7–7.7)
Neutrophils Relative %: 70 %
Platelet Count: 140 K/uL — ABNORMAL LOW (ref 150–400)
RBC: 4.26 MIL/uL (ref 3.87–5.11)
RDW: 15 % (ref 11.5–15.5)
WBC Count: 3.6 K/uL — ABNORMAL LOW (ref 4.0–10.5)
nRBC: 0 % (ref 0.0–0.2)

## 2024-08-05 LAB — PROTIME-INR
INR: 2.5 — ABNORMAL HIGH (ref 0.8–1.2)
Prothrombin Time: 27.8 s — ABNORMAL HIGH (ref 11.4–15.2)

## 2024-08-05 LAB — FERRITIN: Ferritin: 13 ng/mL (ref 11–307)

## 2024-08-05 LAB — VITAMIN B12: Vitamin B-12: 245 pg/mL (ref 180–914)

## 2024-08-05 NOTE — Progress Notes (Signed)
 [facility] OFFICE PROGRESS NOTE   Diagnosis: ITP, CVA  INTERVAL HISTORY:   [name] returns as scheduled.  She continues Coumadin .  No stroke symptoms.  No bleeding.  She had an episode of subxiphoid discomfort and bilious vomiting last week.  No consistent abdominal pain or nausea.  Objective:  Vital signs in last 24 hours:  Blood pressure 133/78, pulse 81, temperature 97.7 F (36.5 C), temperature source Temporal, resp. rate 18, height 5' 6 (1.676 m), weight 185 lb 11.2 oz (84.2 kg), SpO2 100%.   Resp: Lungs clear bilaterally Cardio: Regular rate and rhythm GI: Nontender, no mass, no hepatosplenomegaly Vascular: No leg edema   Lab Results:  Lab Results  Component Value Date   WBC 3.6 (L) 08/05/2024   HGB 10.3 (L) 08/05/2024   HCT 33.0 (L) 08/05/2024   MCV 77.5 (L) 08/05/2024   PLT 140 (L) 08/05/2024   NEUTROABS 2.6 08/05/2024    CMP  Lab Results  Component Value Date   NA 134 (L) 02/03/2024   K 3.5 02/03/2024   CL 99 02/03/2024   CO2 24 02/03/2024   GLUCOSE 104 (H) 02/03/2024   BUN 14 02/03/2024   CREATININE 1.02 (H) 02/03/2024   CALCIUM  9.6 02/03/2024   PROT 6.7 04/04/2024   ALBUMIN 4.3 04/04/2024   AST 34 04/04/2024   ALT 44 (H) 04/04/2024   ALKPHOS 90 04/04/2024   BILITOT 0.6 04/04/2024   GFRNONAA >60 02/03/2024   GFRAA 72 07/28/2020    No results found for: CEA1, CEA, CAN199, CA125  Lab Results  Component Value Date   INR 2.5 (H) 08/05/2024   LABPROT 27.8 (H) 08/05/2024    Imaging:  No results found.  Medications: I have reviewed the patient's current medications.   Assessment/Plan: Thrombocytopenia-progressive 09/20/2022 Admission 09/20/2022 with severe thrombocytopenia, prednisone  09/20/2022, Solu-Medrol  1 mg/kilogram daily start 09/20/2022 Bone marrow biopsy 09/21/2022-hypercellular marrow for age, increased megakaryocytes, no evidence of a lymphoproliferative disorder Prednisone  80 mg daily  09/22/2022 Prednisone  40 mg daily 09/26/2022 Prednisone  30 mg daily 10/04/2022 Prednisone  20 mg daily 11/01/2022 Prednisone  15 mg daily 11/23/2022 Prednisone  10 mg daily 01/03/2023 Prednisone  5 mg daily 01/26/2023 Prednisone  2.5 mg daily 02/22/2023 Prednisone  5 mg daily 03/14/2023 Prednisone  5 alternating with 2.5 mg daily 04/03/2023 Prednisone  40 mg daily X 3, then 20 mg daily start 05/24/2023 Prednisone  decreased to 10 mg daily 06/27/2023 Prednisone  decreased to 10 mg alternating with 5 mg daily 07/04/2023 Cycle 1 rituximab  07/04/2023 Cycle 2 rituximab  07/11/2023 Cycle 3 rituximab  07/18/2023 Prednisone  decreased to 5 mg daily 07/25/2023 Cycle 4 rituximab  07/25/2023 Prednisone  decreased to 5 mg every other day 08/15/2023 Prednisone  discontinued 09/05/2023 Leukopenia History of melanoma located left leg below the knee status post post excision 1998-completed an immunotherapy trial at [facility] Eosinophilic esophagitis Hypertension Anxiety History of colon polyps Hypercholesterolemia  Elevated IgM level Polyclonal hypergammaglobulinemia 02/02/2024: Left cerebellar and left occipital CVAs-aspirin , aspirin  placed on hold when she started Coumadin  anticoagulation Positive lupus anticoagulant 05/08/2024; 05/13/2024 beta-2  glycoprotein IgG/IgM/IgA normal, cardiolipin antibodies IgG and IgA normal, IgM indeterminate range      Disposition: [name] appears stable.  The platelet count is adequate.  The PT/INR is therapeutic.  She will continue Coumadin .  We repeated a lupus anticoagulant panel today.  The plan is to continue Coumadin  if the lupus anticoagulant panel is positive.  She will have a repeat lupus anticoagulant panel in 3 months if today's panel returns negative. She has developed microcytic anemia.  We will follow-up on the ferritin  level from today.  She has a history of colonic polyps and eosinophilic esophagitis.  I will refer her to Dr. Saintclair if the ferritin level returns low.  Arley Hof,  MD  08/05/2024  10:50 AM

## 2024-08-06 LAB — LUPUS ANTICOAGULANT PANEL
DRVVT: 113.7 s — ABNORMAL HIGH (ref 0.0–47.0)
PTT Lupus Anticoagulant: 130.5 s — ABNORMAL HIGH (ref 0.0–43.5)

## 2024-08-06 LAB — DRVVT CONFIRM: dRVVT Confirm: 1.8 ratio — ABNORMAL HIGH (ref 0.8–1.2)

## 2024-08-06 LAB — HEXAGONAL PHASE PHOSPHOLIPID: Hexagonal Phase Phospholipid: 54 s — ABNORMAL HIGH (ref 0–11)

## 2024-08-06 LAB — DRVVT MIX: dRVVT Mix: 86.8 s — ABNORMAL HIGH (ref 0.0–40.4)

## 2024-08-06 LAB — PTT-LA MIX: PTT-LA Mix: 126.5 s — ABNORMAL HIGH (ref 0.0–40.5)

## 2024-08-07 ENCOUNTER — Other Ambulatory Visit: Payer: Self-pay | Admitting: *Deleted

## 2024-08-07 ENCOUNTER — Telehealth: Payer: Self-pay | Admitting: *Deleted

## 2024-08-07 DIAGNOSIS — D649 Anemia, unspecified: Secondary | ICD-10-CM

## 2024-08-07 DIAGNOSIS — D696 Thrombocytopenia, unspecified: Secondary | ICD-10-CM

## 2024-08-07 DIAGNOSIS — Z7901 Long term (current) use of anticoagulants: Secondary | ICD-10-CM

## 2024-08-07 MED ORDER — FERROUS SULFATE 325 (65 FE) MG PO TBEC: 325.0000 mg | DELAYED_RELEASE_TABLET | Freq: Two times a day (BID) | ORAL | Status: AC

## 2024-08-07 NOTE — Progress Notes (Signed)
 Faxed referral to Dr. Saintclair for IDA.

## 2024-08-07 NOTE — Telephone Encounter (Signed)
 LVM for patient to check Mychart for RN message [email or web address]

## 2024-08-08 ENCOUNTER — Inpatient Hospital Stay

## 2024-08-12 ENCOUNTER — Encounter: Payer: Self-pay | Admitting: *Deleted

## 2024-08-12 ENCOUNTER — Other Ambulatory Visit: Payer: Self-pay | Admitting: Oncology

## 2024-08-12 NOTE — Progress Notes (Signed)
 LVM with referral coordinator at [facility] to f/u on referral sent on 08/07/24 to see Dr. Saintclair for new IDA. Requested return call to confirm receipt and give approximation of when she may be seen.

## 2024-08-18 ENCOUNTER — Other Ambulatory Visit: Payer: Self-pay | Admitting: Family Medicine

## 2024-08-18 DIAGNOSIS — E7800 Pure hypercholesterolemia, unspecified: Secondary | ICD-10-CM

## 2024-09-02 ENCOUNTER — Inpatient Hospital Stay: Attending: Oncology

## 2024-09-02 ENCOUNTER — Ambulatory Visit: Payer: Self-pay | Admitting: Medical

## 2024-09-02 ENCOUNTER — Ambulatory Visit
Admission: RE | Admit: 2024-09-02 | Discharge: 2024-09-02 | Disposition: A | Source: Ambulatory Visit | Attending: Medical | Admitting: Medical

## 2024-09-02 ENCOUNTER — Ambulatory Visit: Payer: Self-pay | Admitting: Oncology

## 2024-09-02 ENCOUNTER — Ambulatory Visit: Admitting: Medical

## 2024-09-02 VITALS — BP 138/80 | HR 64 | Temp 97.1°F | Wt 183.4 lb

## 2024-09-02 DIAGNOSIS — D649 Anemia, unspecified: Secondary | ICD-10-CM

## 2024-09-02 DIAGNOSIS — J3489 Other specified disorders of nose and nasal sinuses: Secondary | ICD-10-CM | POA: Diagnosis not present

## 2024-09-02 DIAGNOSIS — R0602 Shortness of breath: Secondary | ICD-10-CM

## 2024-09-02 DIAGNOSIS — J988 Other specified respiratory disorders: Secondary | ICD-10-CM

## 2024-09-02 DIAGNOSIS — D6862 Lupus anticoagulant syndrome: Secondary | ICD-10-CM | POA: Insufficient documentation

## 2024-09-02 DIAGNOSIS — Z79899 Other long term (current) drug therapy: Secondary | ICD-10-CM

## 2024-09-02 DIAGNOSIS — Z7901 Long term (current) use of anticoagulants: Secondary | ICD-10-CM

## 2024-09-02 DIAGNOSIS — R058 Other specified cough: Secondary | ICD-10-CM

## 2024-09-02 DIAGNOSIS — D696 Thrombocytopenia, unspecified: Secondary | ICD-10-CM | POA: Insufficient documentation

## 2024-09-02 LAB — CBC WITH DIFFERENTIAL (CANCER CENTER ONLY)
Abs Immature Granulocytes: 0.01 K/uL (ref 0.00–0.07)
Basophils Absolute: 0 K/uL (ref 0.0–0.1)
Basophils Relative: 1 %
Eosinophils Absolute: 0.2 K/uL (ref 0.0–0.5)
Eosinophils Relative: 3 %
HCT: 34.2 % — ABNORMAL LOW (ref 36.0–46.0)
Hemoglobin: 10.8 g/dL — ABNORMAL LOW (ref 12.0–15.0)
Immature Granulocytes: 0 %
Lymphocytes Relative: 20 %
Lymphs Abs: 1.1 K/uL (ref 0.7–4.0)
MCH: 23.8 pg — ABNORMAL LOW (ref 26.0–34.0)
MCHC: 31.6 g/dL (ref 30.0–36.0)
MCV: 75.3 fL — ABNORMAL LOW (ref 80.0–100.0)
Monocytes Absolute: 0.5 K/uL (ref 0.1–1.0)
Monocytes Relative: 9 %
Neutro Abs: 3.6 K/uL (ref 1.7–7.7)
Neutrophils Relative %: 67 %
Platelet Count: 199 K/uL (ref 150–400)
RBC: 4.54 MIL/uL (ref 3.87–5.11)
RDW: 17.5 % — ABNORMAL HIGH (ref 11.5–15.5)
WBC Count: 5.4 K/uL (ref 4.0–10.5)
nRBC: 0 % (ref 0.0–0.2)

## 2024-09-02 LAB — PROTIME-INR
INR: 1.9 — ABNORMAL HIGH (ref 0.8–1.2)
Prothrombin Time: 22.6 s — ABNORMAL HIGH (ref 11.4–15.2)

## 2024-09-02 LAB — FERRITIN: Ferritin: 22 ng/mL (ref 11–307)

## 2024-09-02 MED ORDER — ALBUTEROL SULFATE HFA 108 (90 BASE) MCG/ACT IN AERS: 2.0000 | INHALATION_SPRAY | Freq: Four times a day (QID) | RESPIRATORY_TRACT | 0 refills | Status: DC | PRN

## 2024-09-02 MED ORDER — BENZONATATE 100 MG PO CAPS: 100.0000 mg | ORAL_CAPSULE | Freq: Two times a day (BID) | ORAL | 0 refills | Status: DC | PRN

## 2024-09-02 MED ORDER — AMOXICILLIN 875 MG PO TABS: 875.0000 mg | ORAL_TABLET | Freq: Two times a day (BID) | ORAL | 0 refills | Status: AC

## 2024-09-02 NOTE — Telephone Encounter (Signed)
 Notified [name] of platelet count, INR and ferritin. Continue same warfarin and continue iron. Will recheck labs on 12/16

## 2024-09-02 NOTE — Patient Instructions (Addendum)
 Please go to [facility] for your chest xray.   Their hours are 8am - 4:30 pm Monday - Friday.  Take your insurance card with you.  [facility] [phone]  [address] [place], [place] [address]

## 2024-09-02 NOTE — Progress Notes (Signed)
 Results through MyChart

## 2024-09-02 NOTE — Progress Notes (Signed)
 Subjective:  Carol Thomas is a 68 y.o. female who presents for Chief Complaint  Patient presents with   Acute Visit    Symptoms started last Monday Night. Cough, cough green/yellow mucous, fatigue, some bloody mucous, rattling, congestion.  Denies-fever, HA, sinus pressure, body aches     Carol Thomas is a 68 year old female with ITP and autoimmune issues and eosinophilic esophagitis who presents with respiratory symptoms and phlegm production.  She has been experiencing respiratory symptoms for the past week, starting with a sore throat and progressing to the production of greenish-yellow phlegm from both her chest and nose, occasionally tinged with blood. She also notes fatigue and slight wheezing or shortness of breath. No ear pain, nausea, vomiting, or fever.  She is currently on Coumadin , which she started recently due to two mini strokes prior. She was previously on baby aspirin  but was switched to Coumadin . She has high blood pressure and has medications at home for it. She has not used inhalers before.  Her past medical history includes ITP, anticoagulant lupus, and EOE. She recently underwent a capsule procedure at Dr. Reba office, which returned normal results. She has a history of bronchitis, both as a child and an adult, with the last episode occurring 15-20 years ago, which developed into pneumonia.  She has a history of melanoma cancer, for which she used to receive regular chest x-rays, but these have since been discontinued.  She does not smoke. She has not taken a COVID test recently.  No other aggravating or relieving factors.    No other c/o.  Past Medical History:  Diagnosis Date   Adenomatous colon polyp 11/16/2008   Dr. Lennard at [place]   Anxiety    Cancer [facility]) 10/16/1996   Melanoma-Left leg   Hypertension    Idiopathic thrombocytopenic purpura (ITP) (HCC)    Osteopenia 10/16/2012   T score of -2.2 FRAX 8%/1.1%   Vitamin D  deficiency 10/16/2012    vitamin D  level 10   Current Outpatient Medications on File Prior to Visit  Medication Sig Dispense Refill   acetaminophen  (TYLENOL ) 500 MG tablet Take 1,000 mg by mouth every 6 (six) hours as needed for mild pain (pain score 1-3), fever or headache.     alendronate  (FOSAMAX ) 70 MG tablet TAKE 1 TABLET EVERY 7 DAYS WITH A FULL GLASS OF WATER ON AN EMPTY STOMACH 12 tablet 0   amLODipine  (NORVASC ) 10 MG tablet Take 1 tablet (10 mg total) by mouth daily. 90 tablet 1   Cholecalciferol  (VITAMIN D3) 50 MCG (2000 UT) TABS Take 2,000 Units by mouth in the morning.     citalopram  (CELEXA ) 20 MG tablet Take 1 tablet (20 mg total) by mouth daily. 90 tablet 1   Coenzyme Q10 (COQ10) 100 MG CAPS Take 100 mg by mouth daily.     ferrous sulfate 325 (65 FE) MG EC tablet Take 1 tablet (325 mg total) by mouth 2 (two) times daily with a meal.     pantoprazole  (PROTONIX ) 40 MG tablet Take 40 mg by mouth daily.     rosuvastatin  (CRESTOR ) 40 MG tablet TAKE 1 TABLET(40 MG) BY MOUTH DAILY 90 tablet 0   SYSTANE COMPLETE PF 0.6 % SOLN Place 1 drop into both eyes 3 (three) times daily as needed (for dryness or irritation).     warfarin (COUMADIN ) 5 MG tablet TAKE 1 TABLET(5 MG) BY MOUTH DAILY 90 tablet 0   No current facility-administered medications on file prior to visit.  The following portions of the patient's history were reviewed and updated as appropriate: allergies, current medications, past family history, past medical history, past social history, past surgical history and problem list.  ROS Otherwise as in subjective above  Objective: BP 138/80   Pulse 64   Temp (!) 97.1 F (36.2 C)   Wt 183 lb 6.4 oz (83.2 kg)   BMI 29.60 kg/m   General appearance: alert, no distress, well developed, well nourished HEENT: normocephalic, sclerae anicteric, conjunctiva pink and moist, TMs flat, nares with turbainte edema, mucoid discharge, + erythema, pharynx normal Oral cavity: MMM, no lesions Neck: supple, no  lymphadenopathy, no thyromegaly, no masses Heart: RRR, normal S1, S2, no murmurs Lungs: coarse sounds, few faint wheezes, +rhonchi, no rales    Assessment: Encounter Diagnoses  Name Primary?   Productive cough Yes   SOB (shortness of breath)    Sinus pressure    Respiratory tract infection    High risk medication use      Plan: Respiratory infection Acute productive cough with green/yellow sputum and mild hemoptysis Coarse lung sounds and wheezing suggest mucus trapping.  Chose amoxicillin  due to less interaction with Coumadin . - Ordered chest x-ray to rule out pneumonia. - Prescribed amoxicillin . - Prescribed albuterol  inhaler. - Prescribed Tessalon  Perles prn. - Recommended Mucinex DM.  Shortness of breath Mild shortness of breath associated with productive cough and wheezing. Albuterol  inhaler prescribed to alleviate symptoms. - Prescribed albuterol  inhaler.  Immune thrombocytopenic purpura and antiphospholipid syndrome Managed with Coumadin  due to history of mini strokes. Regular monitoring by hematologist.   Carol Thomas was seen today for acute visit.  Diagnoses and all orders for this visit:  Productive cough -     DG Chest 2 View; Future  SOB (shortness of breath) -     DG Chest 2 View; Future  Sinus pressure -     DG Chest 2 View; Future  Respiratory tract infection -     DG Chest 2 View; Future  High risk medication use -     DG Chest 2 View; Future  Other orders -     albuterol  (VENTOLIN  HFA) 108 (90 Base) MCG/ACT inhaler; Inhale 2 puffs into the lungs every 6 (six) hours as needed for wheezing or shortness of breath. -     benzonatate  (TESSALON ) 100 MG capsule; Take 1 capsule (100 mg total) by mouth 2 (two) times daily as needed for cough. -     amoxicillin  (AMOXIL ) 875 MG tablet; Take 1 tablet (875 mg total) by mouth 2 (two) times daily for 10 days.    Follow up: pending chest xray

## 2024-09-03 LAB — CARDIOLIPIN ANTIBODIES, IGG, IGM, IGA
Anticardiolipin IgA: <9 U/mL (ref 0–11)
Anticardiolipin IgG: <9 GPL U/mL (ref 0–14)
Anticardiolipin IgM: 17 [MPL'U]/mL — ABNORMAL HIGH (ref 0–12)

## 2024-09-04 LAB — BETA-2-GLYCOPROTEIN I ABS, IGG/M/A
Beta-2 Glyco I IgG: <9 GPI IgG units (ref 0–20)
Beta-2-Glycoprotein I IgA: <9 GPI IgA units (ref 0–25)
Beta-2-Glycoprotein I IgM: 15 GPI IgM units (ref 0–32)

## 2024-09-04 LAB — LUPUS ANTICOAGULANT PANEL
DRVVT: 105.5 s — ABNORMAL HIGH (ref 0.0–47.0)
PTT Lupus Anticoagulant: 117.5 s — ABNORMAL HIGH (ref 0.0–43.5)

## 2024-09-04 LAB — DRVVT MIX: dRVVT Mix: 93.3 s — ABNORMAL HIGH (ref 0.0–40.4)

## 2024-09-04 LAB — HEXAGONAL PHASE PHOSPHOLIPID: Hexagonal Phase Phospholipid: 84 s — ABNORMAL HIGH (ref 0–11)

## 2024-09-04 LAB — PTT-LA MIX: PTT-LA Mix: 118.9 s — ABNORMAL HIGH (ref 0.0–40.5)

## 2024-09-04 LAB — DRVVT CONFIRM: dRVVT Confirm: 1.8 ratio — ABNORMAL HIGH (ref 0.8–1.2)

## 2024-09-09 ENCOUNTER — Other Ambulatory Visit: Payer: Self-pay | Admitting: Oncology

## 2024-09-09 NOTE — Patient Instructions (Incomplete)
 HEALTH MAINTENANCE RECOMMENDATIONS:  It is recommended that you get at least 30 minutes of aerobic exercise at least 5 days/week (for weight loss, you may need as much as 60-90 minutes). This can be any activity that gets your heart rate up. This can be divided in 10-15 minute intervals if needed, but try and build up your endurance at least once a week.  Weight bearing exercise is also recommended twice weekly.  Eat a healthy diet with lots of vegetables, fruits and fiber.  Colorful foods have a lot of vitamins (ie green vegetables, tomatoes, red peppers, etc).  Limit sweet tea, regular sodas and alcoholic beverages, all of which has a lot of calories and sugar.  Up to 1 alcoholic drink daily may be beneficial for women (unless trying to lose weight, watch sugars).  Drink a lot of water.  Calcium  recommendations are 1200-1500 mg daily (1500 mg for postmenopausal women or women without ovaries), and vitamin D  1000 IU daily.  This should be obtained from diet and/or supplements (vitamins), and calcium  should not be taken all at once, but in divided doses.  Monthly self breast exams and yearly mammograms for women over the age of 15 is recommended.  Sunscreen of at least SPF 30 should be used on all sun-exposed parts of the skin when outside between the hours of 10 am and 4 pm (not just when at beach or pool, but even with exercise, golf, tennis, and yard work!)  Use a sunscreen that says broad spectrum so it covers both UVA and UVB rays, and make sure to reapply every 1-2 hours.  Remember to change the batteries in your smoke detectors when changing your clock times in the spring and fall. Carbon monoxide detectors are recommended for your home.  Use your seat belt every time you are in a car, and please drive safely and not be distracted with cell phones and texting while driving.   Carol Thomas , Thank you for taking time to come for your Medicare Wellness Visit. I appreciate your ongoing  commitment to your health goals. Please review the following plan we discussed and let me know if I can assist you in the future.   This is a list of the screening recommended for you and due dates:  Health Maintenance  Topic Date Due   COVID-19 Vaccine (9 - 2025-26 season) 06/16/2024   Medicare Annual Wellness Visit  08/26/2024   Flu Shot  09/15/2024*   Colon Cancer Screening  04/03/2025   Breast Cancer Screening  04/24/2025   DTaP/Tdap/Td vaccine (3 - Td or Tdap) 04/12/2026   Pneumococcal Vaccine for age over 38  Completed   Osteoporosis screening with Bone Density Scan  Completed   Hepatitis C Screening  Completed   Zoster (Shingles) Vaccine  Completed   Meningitis B Vaccine  Aged Out  *Topic was postponed. The date shown is not the original due date.   We discussed the recommendation for 1200-1500 mg of calcium  daily, with the recommendation to get as much from your diet as you can, and make up the difference with supplements. Since you are on coumadin , some of the dietary sources need to be limited (kale, collards). Please re-visit the supplements with Dr. Cloretta if it would be okay to take 500 mg of calcium  daily.  Your serum calcium  is being checked with today's labs, was normal on last check in April.  It is possible that the hydrochlorothiazide  from your prior blood pressure  medication contributed to  higher calcium  levels.  Try and get some more exercise, outside of the days with the trainer (goal is 150 minutes of aerobic exercise per week).  Please bring us  copies of your Living Will and Healthcare Power of Attorney once completed and notarized so that it can be scanned into your medical chart.

## 2024-09-09 NOTE — Progress Notes (Unsigned)
 No chief complaint on file.    Subjective:   Carol Thomas is a 68 y.o. female who presents for a Medicare Annual Wellness Visit.  Allergies (verified) Other   History: Past Medical History:  Diagnosis Date   Adenomatous colon polyp 11/16/2008   Dr. Lennard at [place]   Anxiety    Cancer [facility]) 10/16/1996   Melanoma-Left leg   Hypertension    Idiopathic thrombocytopenic purpura (ITP) (HCC)    Osteopenia 10/16/2012   T score of -2.2 FRAX 8%/1.1%   Vitamin D  deficiency 10/16/2012   vitamin D  level 10   Past Surgical History:  Procedure Laterality Date   APPENDECTOMY     ESOPHAGOGASTRODUODENOSCOPY  05/24/2017   Erythema in the middle third of the esphagus and in lower third.-biopised erythematous mucosa in the antrum, normal examined duodenum   Melanoma excised     TOTAL VAGINAL HYSTERECTOMY  1997   TAH   TUBAL LIGATION     UPPER GI ENDOSCOPY  04/03/2022   Dr. 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[employer])  Tobacco Use   Smoking status: Never   Smokeless tobacco: Never  Substance and Sexual Activity   Alcohol use: No   Drug use: No   Sexual activity: Not Currently    Birth control/protection: Surgical   Tobacco Counseling Counseling given: Not Answered  SDOH Screenings   Food Insecurity: No Food Insecurity (02/03/2024)  Housing: Low Risk  (02/03/2024)  Transportation Needs: No Transportation Needs  (02/03/2024)  Utilities: Not At Risk (02/03/2024)  Depression (PHQ2-9): Low Risk  (08/05/2024)  Social Connections: Moderately Isolated (02/03/2024)  Tobacco Use: Low Risk  (08/01/2024)   See flowsheets for full screening details  Depression Screen PHQ 2 & 9 Depression Scale- Over the past 2 weeks, how often have you been bothered by any of the following problems? Little interest or pleasure in doing things: 0 Feeling down, depressed, or hopeless (PHQ Adolescent also includes...irritable): 0 PHQ-2 Total Score: 0     Goals Addressed   None    Fall Screening Patient Fall Risk Level: Low Fall Risk  Advance Directives (For Healthcare) Does Patient Have a Medical Advance Directive?: Yes Does patient want to make changes to medical advance directive?: No - Patient declined Type of Advance Directive: Out of facility DNR (pink MOST or yellow form) Out of facility DNR (pink MOST or yellow form) in Chart? (Ambulatory ONLY): Yes - validated most recent copy scanned in chart Would patient like information on creating a medical advance directive?: No - Patient declined        Objective:    There were no vitals filed for this visit. There is no height or weight on file to calculate BMI.  Current Medications (verified) Outpatient Encounter Medications as of 09/10/2024  Medication Sig   acetaminophen  (TYLENOL ) 500 MG tablet Take 1,000 mg by mouth every 6 (six) hours as needed for mild pain (pain score 1-3), fever or headache.   albuterol  (VENTOLIN  HFA) 108 (90 Base) MCG/ACT inhaler Inhale 2 puffs into the lungs every 6 (six) hours as needed for wheezing or shortness of breath.   alendronate  (FOSAMAX ) 70 MG tablet TAKE 1 TABLET EVERY 7 DAYS WITH A FULL GLASS OF WATER ON AN EMPTY STOMACH   amLODipine  (NORVASC ) 10 MG tablet Take 1 tablet (10 mg total) by mouth daily.   amoxicillin  (AMOXIL ) 875 MG tablet Take 1 tablet (875 mg total) by mouth 2 (two) times daily for 10 days.   benzonatate   (TESSALON ) 100 MG capsule Take 1 capsule (100 mg total) by mouth 2 (two) times daily as needed for cough.   Cholecalciferol  (VITAMIN D3) 50 MCG (2000 UT) TABS Take 2,000 Units by mouth in the morning.   citalopram  (CELEXA ) 20 MG tablet Take 1 tablet (20 mg total) by mouth daily.   Coenzyme Q10 (COQ10) 100 MG CAPS Take 100 mg by mouth daily.   ferrous sulfate  325 (65 FE) MG EC tablet Take 1 tablet (325 mg total) by mouth 2 (two) times daily with a meal.   pantoprazole  (PROTONIX ) 40 MG tablet Take 40 mg by mouth daily.   rosuvastatin  (CRESTOR ) 40 MG tablet TAKE 1 TABLET(40 MG) BY MOUTH DAILY   SYSTANE COMPLETE PF 0.6 % SOLN Place 1 drop into both eyes 3 (three) times daily as needed (for dryness or irritation).   warfarin (COUMADIN ) 5 MG tablet TAKE 1 TABLET BY MOUTH EVERY DAY, AND 2.5MG (1/2 TABLET) ON MONDAYS, WEDNESDAYS, AND FRIDAYS   [DISCONTINUED] warfarin (COUMADIN ) 5 MG tablet TAKE 1 TABLET(5 MG) BY MOUTH DAILY   No facility-administered encounter medications on file as of 09/10/2024.   Hearing/Vision screen No results found. Immunizations and Health Maintenance Health Maintenance  Topic Date Due   COVID-19 Vaccine (9 - 2025-26 season) 06/16/2024   Medicare Annual Wellness (AWV)  08/26/2024   Influenza Vaccine  09/15/2024 (Originally 05/16/2024)   Colonoscopy  04/03/2025   Mammogram  04/24/2025   DTaP/Tdap/Td (3 - Td or Tdap) 04/12/2026   Pneumococcal Vaccine: 50+ Years  Completed   Bone Density Scan  Completed   Hepatitis C Screening  Completed   Zoster Vaccines- Shingrix   Completed   Meningococcal B Vaccine  Aged Out        Assessment/Plan:  This is a routine wellness examination for Carol Thomas.  Patient Care Team: Randol Dawes, MD as PCP - General (Family Medicine) Cloretta Arley NOVAK, MD as Consulting Physician (Oncology) Saintclair Jasper, MD as Consulting Physician (Gastroenterology) Elnor Rome BROCKS, MD as Referring Physician (Specialist) [facility] [place], [place], [place]  I  have personally reviewed and noted the following in the patient's chart:   Medical and social history Use of alcohol, tobacco or illicit drugs  Current medications and supplements including opioid prescriptions. Functional ability and status Nutritional status Physical activity Advanced directives List of other physicians Hospitalizations, surgeries, and ER visits in previous 12 months Vitals Screenings to include cognitive, depression, and falls Referrals and appointments  No orders of the defined types were placed in this encounter.  In addition, I have reviewed and discussed with patient certain preventive protocols, quality metrics, and best practice recommendations. A written personalized care plan for preventive services as well as general preventive health recommendations were provided to patient.   Dawes DELENA Randol, MD   09/09/2024  No follow-ups on file.  After Visit Summary: (In Person-Printed) AVS printed and given to the patient

## 2024-09-09 NOTE — Progress Notes (Unsigned)
 No chief complaint on file.  Carol Thomas is a 68 y.o. female who presents for Medicare wellness visit (see separate note) and follow-up on chronic medical conditions.    She was seen by Ludie 11/18 with URI symptoms. She was treated with amox and albuterol . CXR was normal.  H/o stroke in 01/2024 (acute L cerebellar infarct and subacute L parietal WM infarct).  Etiology is small vessel disease. She last saw neuro 07/2024.  Since her last visit with me, she was diagnosed with antiphospholipid syndrome, and her 81mg  ASA was changed to coumadin . This is managed by hematologist.  Lab Results  Component Value Date   INR 1.9 (H) 09/02/2024   INR 2.5 (H) 08/05/2024   INR 2.3 (H) 07/17/2024    LDL was 75 at the time of her stroke (goal is <70).  Crestor  dose was increased from 10 mg to 20mg  at that time.   LDL had remained >70, so dose was increased to 40 mg in June.  Due for recheck.  Component Ref Range & Units (hover) 5 mo ago (04/04/24) 7 mo ago (02/03/24) 1 yr ago (02/13/23) 2 yr ago (08/11/22) 2 yr ago (06/02/22) 2 yr ago (02/02/22) 3 yr ago (08/11/21)  Cholesterol, Total 142  160 194 147 223 High  216 High   Triglycerides 62 58 R 71 55 46 58 71  HDL 55 63 R 72 70 69 70 71  VLDL Cholesterol Cal 13  14 10 10 10 13   LDL Chol Calc (NIH) 74 76 74 114 High  68 143 High  132 High   Chol/HDL Ratio 2.6 2.4 R 2.2 CM 2.8 CM 2.1 CM 3.2 CM 3.0 CM     Hypertension. She continues on amlodipine  10mg . Losartan  HCT was stopped upon discharge from the hospital from her stroke in 01/2024, as BP's weren't high. She was to monitor her BP's and resume losartan  HCT if BP trended back up. She has not restarted this. BP's are running ***  pt's home monitor last verified as accurate on 08/2022   BP Readings from Last 3 Encounters:  09/02/24 138/80  08/05/24 133/78  08/01/24 (!) 168/95     She denies dizziness, chest pain, palpitations, shortness of breath.  She gets some frontal headaches/sinus.     Anxiety: She continues on citalopram  20mg .  She is raising her 2 grandsons since her son passed away.  Doing well overall.  Thrombocytopenia/ITP:  under the care of Dr. Cloretta, last seen 06/2024. She completed a course of rituximab  in October 2024, and has been maintained off of prednisone  since 08/2023.  She was started on coumadin  in August for antiphospholipid syndrome.  They are monitoring her INR and adjusting doses. She denies any bleeding/bruising.  She was noted to have drop in hemoglobin. Hematology has recommended she f/u with Dr. Saintclair (sent another referral in October to ensure she got seen. She last had EGD in 03/2023, normal; colonoscopy in 03/2022.   Component Ref Range & Units (hover) 7 d ago (09/02/24) 1 mo ago (08/05/24) 2 mo ago (07/08/24) 2 mo ago (06/27/24) 2 mo ago (06/13/24) 3 mo ago (05/26/24) 3 mo ago (05/19/24)  WBC Count 5.4 3.6 Low  4.0 3.6 Low  3.8 Low  3.7 Low  3.9 Low   RBC 4.54 4.26 4.16 4.09 3.96 4.18 4.35  Hemoglobin 10.8 Low  10.3 Low  CM 10.7 Low  10.9 Low  10.7 Low  11.6 Low  12.1  HCT 34.2 Low  33.0 Low  33.7 Low  32.9 Low  32.5 Low  34.9 Low  36.3  MCV 75.3 Low  77.5 Low  81.0 80.4 82.1 83.5 83.4  MCH 23.8 Low  24.2 Low  25.7 Low  26.7 27.0 27.8 27.8  MCHC 31.6 31.2 31.8 33.1 32.9 33.2 33.3  RDW 17.5 High  15.0 14.3 14.0 14.1 14.1 13.4  Platelet Count 199 140 Low  134 Low  102 Low  114 Low  62 Low  61 Low  CM   Component Ref Range & Units (hover) 7 d ago 1 mo ago  Ferritin 22 13 CM    EOE, GERD:  diagnosed/treated by Dr. Saintclair.  She is taking PPI twice daily.   Had EGD in 03/2023,  3 year f/u recommended. No med changes were made at that time. She has some occasional regurgitation, only rare heartburn.  Denies dysphagia.    Osteopenia: last DEXA was 04/2024, with T-2.0 L fem neck, -1.8 at R fem neck.  While there was no direct comparison, these values seem improved from 12/2021 DEXA with T-2.3 R fem neck; FRAX 19.3/3.5% and 2021--T-2.4 at L fem neck  in 2021.   Alendronate  was started in April 2023 (due to elevated FRAX). She reports tolerating this without side effects, no chest pain.    She used to take a calcium  supplement daily, but was told in the past by Dr. Cloretta to stop this.  She can't tolerate milk or non-dairy milk (irrited her stomach/reflux).  She can't tolerate much yogurt, does better with Greek, but hasn't been having any yogurt.   She continues to take vitamin D . She hasn't been getting regular weight bearing exercise (not since her trainer quit). She is considered joining [employer]. ***UPDATE ALL   H/o vitamin D  deficiency: level was low at 19 in 2016.  Last check was normal at 49.8 in 07/2021, when taking 2000 IU daily.   She continues to take 2000 IU daily.  Component Ref Range & Units (hover) 3 yr ago (08/11/21) 6 yr ago (10/22/17) 7 yr ago (10/17/16) 8 yr ago (04/11/16) 9 yr ago (09/08/15) 9 yr ago (02/22/15) 10 yr ago (08/25/14)  Vit D, 25-Hydroxy 49.8 30 R, CM 35 R, CM 37 R, CM 19 Low  R, CM 15 Low  R, CM 19 Low  R, CM     H/o Impaired fasting glucose:  Last fasting sugar was 104 in 01/2024. A1c was normal at 5.2. She tries to limit sugar/sweets/carbs. Tries to limit pasta. No sugary beverages, other than 2 tsp of sugar in her coffee.    She hasn't been exercising much in the last few months, since her trainer quit. She was also told not to do a lot of heavy lifting by oncologist. ***UPDATE  Immunization History  Administered Date(s) Administered   Fluad Trivalent(High Dose 65+) 08/27/2023   Hepatitis A 08/16/2006   Hepatitis A, Adult 10/02/2006, 04/12/2016   Influenza Inj Mdck Quad Pf 08/01/2017   Influenza,inj,Quad PF,6+ Mos 10/10/2012, 08/19/2014, 09/08/2015, 10/18/2016, 08/08/2018, 07/24/2019, 08/02/2020, 08/11/2021, 06/08/2022   Influenza-Unspecified 08/16/2017   PFIZER Comirnaty(Gray Top)Covid-19 Tri-Sucrose Vaccine 02/02/2021   PFIZER(Purple Top)SARS-COV-2 Vaccination 12/21/2019, 01/21/2020,  08/24/2020   PNEUMOCOCCAL CONJUGATE-20 08/17/2022   Pfizer Covid-19 Vaccine Bivalent Booster 60yrs & up 08/11/2021   Pfizer(Comirnaty)Fall Seasonal Vaccine 12 years and older 08/17/2022, 02/15/2023, 08/27/2023   Tdap 10/02/2006, 04/12/2016   Zoster Recombinant(Shingrix ) 05/06/2018, 08/08/2018   Last Pap smear: 02/2014 Dr. Rockney; s/p hysterectomy in 1997 Last mammogram: 04/2024 Last colonoscopy:  03/2022, 4  sessile serrated polyps (Dr. Saintclair). 3 year follow-up was recommended (per pt). She also had EGD 03/2022--duodenitis, esophagitis, and increased eosinophils noted (EOE). She states she had another EGD in 03/2023, no results received. Extremely red per pt, polyps removed but fine. 3 year f/u EGD recommended. Last DEXA: 04/2024 T-2.0 L fem neck, -1.8 R fem neck (on Alendronate ). Prev 12/2021--T-2.3 R fem neck (elevated FRAX), 11/2019 T-2.4 L fem neck (normal FRAX) Dentist: twice yearly Ophtho: yearly Exercise:  Her trainer quit in July, no weight-bearing exercise since then. She has been getting tired more easily (and sweating much easier), so hasn't been as active since the infusions.  Hasn't been walking. She does some yardwork.  Isn't supposed to be lifting the pellets for the stove (40#).   End of Life Discussion:  Patient does not have a living will and medical power of attorney   PMH, PSH, SH and FH were reviewed and updated     ROS:  The patient denies anorexia, fever, decreased hearing, ear pain, sore throat, breast concerns, chest pain, palpitations, dizziness, syncope, dyspnea on exertion, cough, swelling, nausea, vomiting, diarrhea, constipation, abdominal pain, melena, hematochezia, hematuria, incontinence, dysuria, vaginal bleeding, discharge, odor or itch, genital lesions, joint pains (just intermittent, sometimes wrist), numbness, tingling, weakness, tremor, suspicious skin lesions, depression, abnormal bleeding/bruising, or enlarged lymph nodes.   Since her recent  infusions, she notes more fatigue, especially with exertion.  She has shortlived HA at [place], thinks related to infusions also. GERD--rare heartburn, but has occ cough from reflux ,and some regurgitation. Sees dermatologist regularly (h/o melanoma). No recent easy bruising, denies any bleeding. +weight gain (related to prednisone  use, less exercise).   PHYSICAL EXAM:  There were no vitals taken for this visit.  Wt Readings from Last 3 Encounters:  09/02/24 183 lb 6.4 oz (83.2 kg)  08/05/24 185 lb 11.2 oz (84.2 kg)  08/01/24 187 lb 12.8 oz (85.2 kg)  02/2023 188# 9.6 oz 08/2022  172#  General Appearance:    Alert, cooperative, no distress, appears stated age  Head:    Normocephalic, without obvious abnormality, atraumatic  Eyes:    PERRL, conjunctiva/corneas clear, EOM's intact, fundi benign  Ears:    Normal TM's and external ear canals  Nose:   No drainage or sinus tenderness  Throat:   Normal mucosa, no lesions  Neck:   Supple, no lymphadenopathy;  thyroid :  no enlargement/ tenderness/nodules; no carotid bruit or JVD  Back:    Spine nontender, no curvature, ROM normal, no CVA  tenderness  Lungs:     Clear to auscultation bilaterally without wheezes, rales or    ronchi; respirations unlabored  Chest Wall:    No tenderness or deformity   Heart:    Regular rate and rhythm, S1 and S2 normal, no murmur, rub   or gallop  Breast Exam:    No tenderness, masses, or nipple discharge or inversion.   No axillary lymphadenopathy  Abdomen:     Soft, non-tender, nondistended, normoactive bowel sounds,    no masses, no hepatosplenomegaly  Genitalia:    Normal external genitalia without lesions.  BUS and vagina normal. No abnormal vaginal discharge.  Uterus is surgically absent; adnexa not enlarged, nontender, no masses. ***  Rectal:    Normal tone, no masses or tenderness; guaiac negative stool  Extremities:   No clubbing, cyanosis or edema ***  Pulses:   2+ and symmetric all extremities   Skin:   Skin color, texture, turgor normal, no rashes or  lesions  Lymph nodes:   Cervical, supraclavicular, inguinal and axillary nodes normal  Neurologic:   Normal strength, sensation and gait; reflexes 2+ and symmetric throughout                              Psych:   Normal mood, hygiene and grooming.    ***UPDATE --skipping GU/rectal?     08/27/2023    9:34 AM 02/15/2023   11:12 AM 08/17/2022    9:50 AM 07/24/2019    3:19 PM  GAD 7 : Generalized Anxiety Score  Nervous, Anxious, on Edge 0 1 0 1  Control/stop worrying 0 1 3 0  Worry too much - different things 0 1 3 3   Trouble relaxing 0 0 1 0  Restless 0 0 3 0  Easily annoyed or irritable 0 1 2 0  Afraid - awful might happen 0 0 0 0  Total GAD 7 Score 0 4 12 4   Anxiety Difficulty Not difficult at all Not difficult at all Somewhat difficult        08/05/2024   10:31 AM 07/08/2024   10:34 AM 06/13/2024    9:48 AM 05/19/2024   11:00 AM 05/08/2024    2:23 PM  Depression screen PHQ 2/9  Decreased Interest 0 0 0 0 0  Down, Depressed, Hopeless 0 0 0 0 0  PHQ - 2 Score 0 0 0 0 0     ASSESSMENT/PLAN:  Doesn't need pelvic/rectal if no symptoms (normal last year)  Flu, COVID Phq9 and gad7 (on treatment)  Lipids--if LDL remains >70, increase statin D if change in supplements  Did she get appt with Dr. Saintclair (for new anemia? Onc had put in referral in October)  Add to Care team Hematologist: Dr. Cloretta Neurologist: Dr. Rosemarie  Remind to complete/return living will, HCPOA. Does she still have forms? Need new ones?  Neuro rec home sleep study to r/o OSA, pt had declined.   Discussed monthly self breast exams and yearly mammograms; at least 30 minutes of aerobic activity at least 5 days/week and weight-bearing exercise at least 2-3 days/week; proper sunscreen use reviewed; healthy diet, including goals of calcium  and vitamin D  intake and alcohol recommendations (less than or equal to 1 drink/day) reviewed; regular seatbelt  use; changing batteries in smoke detectors.  Immunization recommendations discussed--continue yearly high dose flu shots, given today.  *** COVID booster given today.*** Colonoscopy recommendations reviewed, UTD, due 03/2025. DEXA UTD, due again 04/2026  MOST form reviewed, completed. Full code, full care. Discussed living will and healthcare power of attorney, forms given in the past. Asked for copies when completed, to be scanned into medical chart.    F/u 6 mos med check, AWV 1 year

## 2024-09-10 ENCOUNTER — Ambulatory Visit
Admission: RE | Admit: 2024-09-10 | Discharge: 2024-09-10 | Disposition: A | Discharge status: Home / Self Care | Source: Ambulatory Visit

## 2024-09-10 ENCOUNTER — Ambulatory Visit (INDEPENDENT_AMBULATORY_CARE_PROVIDER_SITE_OTHER): Payer: Self-pay | Admitting: Family Medicine

## 2024-09-10 ENCOUNTER — Encounter: Payer: Self-pay | Admitting: Family Medicine

## 2024-09-10 ENCOUNTER — Other Ambulatory Visit: Payer: Self-pay

## 2024-09-10 VITALS — BP 130/86 | HR 80 | Temp 98.4°F | Ht 65.0 in | Wt 182.8 lb

## 2024-09-10 DIAGNOSIS — T189XXA Foreign body of alimentary tract, part unspecified, initial encounter: Secondary | ICD-10-CM

## 2024-09-10 DIAGNOSIS — Z23 Encounter for immunization: Secondary | ICD-10-CM

## 2024-09-10 DIAGNOSIS — Z0000 Encounter for general adult medical examination without abnormal findings: Secondary | ICD-10-CM

## 2024-09-10 DIAGNOSIS — Z8673 Personal history of transient ischemic attack (TIA), and cerebral infarction without residual deficits: Secondary | ICD-10-CM

## 2024-09-10 DIAGNOSIS — R7301 Impaired fasting glucose: Secondary | ICD-10-CM | POA: Diagnosis not present

## 2024-09-10 DIAGNOSIS — D509 Iron deficiency anemia, unspecified: Secondary | ICD-10-CM

## 2024-09-10 DIAGNOSIS — I700 Atherosclerosis of aorta: Secondary | ICD-10-CM

## 2024-09-10 DIAGNOSIS — Z85820 Personal history of malignant melanoma of skin: Secondary | ICD-10-CM

## 2024-09-10 DIAGNOSIS — F411 Generalized anxiety disorder: Secondary | ICD-10-CM

## 2024-09-10 DIAGNOSIS — I10 Essential (primary) hypertension: Secondary | ICD-10-CM

## 2024-09-10 DIAGNOSIS — D693 Immune thrombocytopenic purpura: Secondary | ICD-10-CM

## 2024-09-10 DIAGNOSIS — M85852 Other specified disorders of bone density and structure, left thigh: Secondary | ICD-10-CM

## 2024-09-10 DIAGNOSIS — K200 Eosinophilic esophagitis: Secondary | ICD-10-CM

## 2024-09-10 DIAGNOSIS — Z5181 Encounter for therapeutic drug level monitoring: Secondary | ICD-10-CM

## 2024-09-10 DIAGNOSIS — E559 Vitamin D deficiency, unspecified: Secondary | ICD-10-CM

## 2024-09-10 LAB — CMP14+EGFR
ALT: 21 IU/L (ref 0–32)
AST: 33 IU/L (ref 0–40)
Albumin: 4.4 g/dL (ref 3.9–4.9)
Alkaline Phosphatase: 70 IU/L (ref 49–135)
BUN/Creatinine Ratio: 14 (ref 12–28)
BUN: 15 mg/dL (ref 8–27)
Bilirubin Total: 0.6 mg/dL (ref 0.0–1.2)
CO2: 21 mmol/L (ref 20–29)
Calcium: 9.6 mg/dL (ref 8.7–10.3)
Chloride: 103 mmol/L (ref 96–106)
Creatinine, Ser: 1.04 mg/dL — ABNORMAL HIGH (ref 0.57–1.00)
Globulin, Total: 2.5 g/dL (ref 1.5–4.5)
Glucose: 91 mg/dL (ref 70–99)
Potassium: 4.8 mmol/L (ref 3.5–5.2)
Sodium: 138 mmol/L (ref 134–144)
Total Protein: 6.9 g/dL (ref 6.0–8.5)
eGFR: 59 mL/min/1.73 — ABNORMAL LOW (ref >59)

## 2024-09-10 LAB — LIPID PANEL
Chol/HDL Ratio: 2.5 ratio (ref 0.0–4.4)
Cholesterol, Total: 120 mg/dL (ref 100–199)
HDL: 48 mg/dL (ref >39)
LDL Chol Calc (NIH): 57 mg/dL (ref 0–99)
Triglycerides: 70 mg/dL (ref 0–149)
VLDL Cholesterol Cal: 15 mg/dL (ref 5–40)

## 2024-09-11 ENCOUNTER — Ambulatory Visit: Payer: Self-pay | Admitting: Family Medicine

## 2024-09-15 ENCOUNTER — Ambulatory Visit: Admitting: Family Medicine

## 2024-09-15 ENCOUNTER — Ambulatory Visit: Payer: Self-pay

## 2024-09-15 ENCOUNTER — Encounter: Payer: Self-pay | Admitting: Family Medicine

## 2024-09-15 VITALS — BP 134/84 | HR 80 | Temp 97.3°F | Ht 65.0 in | Wt 181.4 lb

## 2024-09-15 DIAGNOSIS — Z7901 Long term (current) use of anticoagulants: Secondary | ICD-10-CM

## 2024-09-15 DIAGNOSIS — R3915 Urgency of urination: Secondary | ICD-10-CM

## 2024-09-15 DIAGNOSIS — R350 Frequency of micturition: Secondary | ICD-10-CM | POA: Diagnosis not present

## 2024-09-15 LAB — POCT URINALYSIS DIP (PROADVANTAGE DEVICE)
Glucose, UA: NEGATIVE mg/dL
Leukocytes, UA: NEGATIVE
Nitrite, UA: NEGATIVE
Protein Ur, POC: 30 mg/dL — AB
Specific Gravity, Urine: 1.025
Urobilinogen, Ur: 0.2
pH, UA: 6 (ref 5.0–8.0)

## 2024-09-15 NOTE — Patient Instructions (Signed)
  Your urine is very concentrated, indicating that you are not well hydrated. Please drink plenty of water, and you should see the urine color lighten. The goal is pale yellow urine. We are sending your urine for culture--the urinalysis did not show evidence of infection, but your symptoms are quite classic. Since antibiotics will interfere with your coumadin  (requiring more frequent blood tests and dose adjustments), let's work on hydrating and see what the cultlure shows, rather than treating presumptively based on symptoms. If your symptoms are worsening, if you develop blood in the urine, fever, flank pain--then we should not continue to wait for the culture results, and start a medication presumptively.  We discussed cranberry juice or cranberry extract tablets SOMETIMES being helpful (only for certain bacteria) in preventing a UTI when symptoms first start, before a true infection is present.  We discussed using pyridium (AZO, available over-the-counter)--this will turn the urine orange, but will anesthetize the bladder and help treat your discomfort while waiting for the culture. This does NOT treat any underlying infection, just masks the symptoms. Do not sue that for more than 2-3 days.  If your culture does show infection, we will have the sensitivities to see which antibiotics should work.  Unfortunately, many of them have interactions with warfarin, and might need close monitoring and dose adjustments through Dr. Andriette office.  It is important to avoid all caffeine and alcohol while you are having these symptoms.

## 2024-09-15 NOTE — Telephone Encounter (Signed)
 FYI Only or Action Required?: FYI only for provider: appointment scheduled on today.  Patient was last seen in primary care on 09/10/2024 by Randol Dawes, MD.  Called Nurse Triage reporting Dysuria.  Symptoms began several days ago.  Interventions attempted: Nothing.  Symptoms are: unchanged.  Triage Disposition: See HCP Within 4 Hours (Or PCP Triage)  Patient/caregiver understands and will follow disposition?: Yes, will follow disposition  Copied from [id number]. Topic: Clinical - Red Word Triage >> Sep 15, 2024  2:46 PM Fonda T wrote: [facility] that prompted transfer to Nurse Triage: Pt reports she thinks she has a UTI, symptoms include painful, and burning urination, abdominal pressure, and urgency.  Pt requesting an appt for evaluation. Reason for Disposition  Side (flank) or lower back pain present  Answer Assessment - Initial Assessment Questions 1. SYMPTOM: What's the main symptom you're concerned about? (e.g., frequency, incontinence)     Burning and pressure 2. ONSET: When did the  burning and pressure  start?     4-5 days  3. PAIN: Is there any pain? If Yes, ask: How bad is it? (Scale: 1-10; mild, moderate, severe)     Denies pain, states pressure more than pain,  4. CAUSE: What do you think is causing the symptoms?     Pt believes it is a UTI, frequent UTIs 5. OTHER SYMPTOMS: Do you have any other symptoms? (e.g., blood in urine, fever, flank pain, pain with urination)     Urgency, abd pressure,  Protocols used: Urinary Symptoms-A-AH

## 2024-09-15 NOTE — Progress Notes (Signed)
 Chief Complaint  Patient presents with   Acute Visit    Urinary frequency and when she gets in the bathroom she doesn't have to go much-started on Thanksgiving. Just finished amoxil  last Sat.     She reports today that she had noticed slight urinary frequency prior to her recent physical here--she cut back on caffeine and was drinking more water and that seemed to help. After she had a glass of tea on Thanksgiving, that triggered her to feel more pressure again. She is trying to drink mainly water (rare coffee, no tea). She is having urinary urgency, frequency, with slight discomfort when she voids. She had one episode of incontinence.  She states she had 48 ounces of water so far today, yet she is only voiding small quantities.   She has pressure at her lower abdomen. She denies vaginal itching, discharge (recently completed ABX for respiratory illness).  She last had UTI in 03/2024, Klebsiella pneumoniae grew on culture. Treated with macrobid . She states this doesn't feel as severe as that UTI.  Prior UTI was 04/2023 (ER visit), grew out enterobacter aerogenes (sensitive to all, except intermediate to macrobid ). She truly through that these infections were both more recent, and closer together in time, but chart reviewed in detailed with her, confirming these dates.    PMH, PSH, SH reviewed  On warfarin (stroke, antiphospholipid syndrome), managed by hematologist.  Outpatient Encounter Medications as of 09/15/2024  Medication Sig Note   acetaminophen  (TYLENOL ) 500 MG tablet Take 1,000 mg by mouth every 6 (six) hours as needed for mild pain (pain score 1-3), fever or headache. 09/15/2024: As needed   albuterol  (VENTOLIN  HFA) 108 (90 Base) MCG/ACT inhaler Inhale 2 puffs into the lungs every 6 (six) hours as needed for wheezing or shortness of breath. 09/15/2024: As needed   alendronate  (FOSAMAX ) 70 MG tablet TAKE 1 TABLET EVERY 7 DAYS WITH A FULL GLASS OF WATER ON AN EMPTY STOMACH     amLODipine  (NORVASC ) 10 MG tablet Take 1 tablet (10 mg total) by mouth daily.    Cholecalciferol  (VITAMIN D3) 50 MCG (2000 UT) TABS Take 2,000 Units by mouth in the morning.    citalopram  (CELEXA ) 20 MG tablet Take 1 tablet (20 mg total) by mouth daily.    Coenzyme Q10 (COQ10) 100 MG CAPS Take 100 mg by mouth daily.    pantoprazole  (PROTONIX ) 20 MG tablet Take 20 mg by mouth 2 (two) times daily.    rosuvastatin  (CRESTOR ) 40 MG tablet TAKE 1 TABLET(40 MG) BY MOUTH DAILY    SYSTANE COMPLETE PF 0.6 % SOLN Place 1 drop into both eyes 3 (three) times daily as needed (for dryness or irritation). 09/15/2024: As needed   warfarin (COUMADIN ) 5 MG tablet TAKE 1 TABLET BY MOUTH EVERY DAY, AND 2.5MG (1/2 TABLET) ON MONDAYS, WEDNESDAYS, AND FRIDAYS 09/15/2024: 5mg  M,W and F and 2.5 the other days    [DISCONTINUED] pantoprazole  (PROTONIX ) 40 MG tablet Take 40 mg by mouth daily. 09/10/2024: Takes 20 mg BID   ferrous sulfate  325 (65 FE) MG EC tablet Take 1 tablet (325 mg total) by mouth 2 (two) times daily with a meal. (Patient not taking: Reported on 09/15/2024)    [DISCONTINUED] benzonatate  (TESSALON ) 100 MG capsule Take 1 capsule (100 mg total) by mouth 2 (two) times daily as needed for cough.    No facility-administered encounter medications on file as of 09/15/2024.   Allergies  Allergen Reactions   Other Other (See Comments)    Patient has Eosinophilic  Esophagitis = Heartburn/acid reflux Eosinophilic esophagitis (EoE) is an inflammation of the esophagus (the tube connecting the mouth to the stomach), caused by a specific white blood cell - the eosinophil. NO eggs, juices, hot dogs, milk, cucumbers, etc...SABRASABRASABRA    ROS: no f/c, no n/v/d, rashes. Cough has resolved. Denies hematuria. No h/o kidney stones. See HPI    PHYSICAL EXAM:  BP 134/84   Pulse 80   Temp (!) 97.3 F (36.3 C) (Tympanic)   Ht 5' 5 (1.651 m)   Wt 181 lb 6.4 oz (82.3 kg)   BMI 30.19 kg/m   Pleasant, well-appearing female, in  no distress HEENT: conjunctiva and sclera are clear, EOMI Neck: no lymphadenopathy or mass Heart: regular rate and rhythm Lungs: clear bilaterally Back: no CVA tenderness Abdomen: soft, nontender, no organomegaly or mass.  Area of discomfort is suprapubic, but nontender to palpation Extremities: no edema Skin: no rashes, normal turgor Psych: normal mood, affect, hygiene and grooming Neuro: alert and oriented, cranial nerves grossly intact, normal gait  Urine: SG 1.025, trace blood, trace ketones, mod bili. Dark yellow. Negative nitrite and leuks     ASSESSMENT/PLAN:  Urinary frequency - urgency/frequency sounds c/w UTI, but urine lacks e/o infection. Urine is concentrated. Avoid caffeine, hydrate better. Await culture for treatment - Plan: POCT Urinalysis DIP (Proadvantage Device), Urine Culture  Urinary urgency  Long term current use of anticoagulant - Taking coumadin , with significant interactions with many ABX. Just recently finished amoxil  for resp infxn; other ABX will affect coumadin  dosing. Await culture  Given normal urine dip (no leuks or nitrite), normal exam, concentrated urine, will hold off on presumptively treating for UTI (symptoms are c/w UTI).  Ddx reviewed, and reasons for UTI's discussed. To hydrate well. To contact us  prior to results being back if she develops fever, worsening pain, hematuria, flank pain, n/v or other serious symptoms. Advised that if another antibiotic is started, she will need to let Dr. Andriette office be aware, as she may need closer f/u re: INR and poss dosing adjustment due to interactions. If not worsening, to continue hydration efforts and avoid caffeine, and await culture results.  I spent 33 minutes dedicated to the care of this patient, including pre-visit review of records, face to face time, post-visit ordering of testing and documentation.  Your urine is very concentrated, indicating that you are not well hydrated. Please drink  plenty of water, and you should see the urine color lighten. The goal is pale yellow urine. We are sending your urine for culture--the urinalysis did not show evidence of infection, but your symptoms are quite classic. Since antibiotics will interfere with your coumadin  (requiring more frequent blood tests and dose adjustments), let's work on hydrating and see what the cultlure shows, rather than treating presumptively based on symptoms. If your symptoms are worsening, if you develop blood in the urine, fever, flank pain--then we should not continue to wait for the culture results, and start a medication presumptively.  We discussed cranberry juice or cranberry extract tablets SOMETIMES being helpful (only for certain bacteria) in preventing a UTI when symptoms first start, before a true infection is present.  We discussed using pyridium (AZO, available over-the-counter)--this will turn the urine orange, but will anesthetize the bladder and help treat your discomfort while waiting for the culture. This does NOT treat any underlying infection, just masks the symptoms. Do not sue that for more than 2-3 days.  If your culture does show infection, we will have the sensitivities  to see which antibiotics should work.  Unfortunately, many of them have interactions with warfarin, and might need close monitoring and dose adjustments through Dr. Andriette office.  It is important to avoid all caffeine and alcohol while you are having these symptoms.

## 2024-09-17 ENCOUNTER — Ambulatory Visit: Payer: Self-pay | Admitting: Family Medicine

## 2024-09-17 LAB — URINE CULTURE

## 2024-09-30 ENCOUNTER — Encounter: Payer: Self-pay | Admitting: Nurse Practitioner

## 2024-09-30 ENCOUNTER — Inpatient Hospital Stay: Attending: Oncology

## 2024-09-30 ENCOUNTER — Inpatient Hospital Stay: Admitting: Nurse Practitioner

## 2024-09-30 ENCOUNTER — Encounter: Payer: Self-pay | Admitting: *Deleted

## 2024-09-30 VITALS — BP 143/79 | HR 65 | Temp 97.8°F | Resp 18 | Ht 65.0 in | Wt 182.6 lb

## 2024-09-30 DIAGNOSIS — D6862 Lupus anticoagulant syndrome: Secondary | ICD-10-CM | POA: Diagnosis present

## 2024-09-30 DIAGNOSIS — D509 Iron deficiency anemia, unspecified: Secondary | ICD-10-CM | POA: Insufficient documentation

## 2024-09-30 DIAGNOSIS — Z7901 Long term (current) use of anticoagulants: Secondary | ICD-10-CM

## 2024-09-30 DIAGNOSIS — D696 Thrombocytopenia, unspecified: Secondary | ICD-10-CM | POA: Diagnosis present

## 2024-09-30 DIAGNOSIS — Z85820 Personal history of malignant melanoma of skin: Secondary | ICD-10-CM | POA: Insufficient documentation

## 2024-09-30 DIAGNOSIS — Z7952 Long term (current) use of systemic steroids: Secondary | ICD-10-CM | POA: Diagnosis not present

## 2024-09-30 DIAGNOSIS — D649 Anemia, unspecified: Secondary | ICD-10-CM

## 2024-09-30 LAB — CBC WITH DIFFERENTIAL (CANCER CENTER ONLY)
Abs Immature Granulocytes: 0.01 K/uL (ref 0.00–0.07)
Basophils Absolute: 0.1 K/uL (ref 0.0–0.1)
Basophils Relative: 1 %
Eosinophils Absolute: 0.3 K/uL (ref 0.0–0.5)
Eosinophils Relative: 6 %
HCT: 35.3 % — ABNORMAL LOW (ref 36.0–46.0)
Hemoglobin: 11.3 g/dL — ABNORMAL LOW (ref 12.0–15.0)
Immature Granulocytes: 0 %
Lymphocytes Relative: 23 %
Lymphs Abs: 1.1 K/uL (ref 0.7–4.0)
MCH: 23.7 pg — ABNORMAL LOW (ref 26.0–34.0)
MCHC: 32 g/dL (ref 30.0–36.0)
MCV: 74.2 fL — ABNORMAL LOW (ref 80.0–100.0)
Monocytes Absolute: 0.3 K/uL (ref 0.1–1.0)
Monocytes Relative: 7 %
Neutro Abs: 3 K/uL (ref 1.7–7.7)
Neutrophils Relative %: 63 %
Platelet Count: 104 K/uL — ABNORMAL LOW (ref 150–400)
RBC: 4.76 MIL/uL (ref 3.87–5.11)
RDW: 18.7 % — ABNORMAL HIGH (ref 11.5–15.5)
WBC Count: 4.7 K/uL (ref 4.0–10.5)
nRBC: 0 % (ref 0.0–0.2)

## 2024-09-30 LAB — PROTIME-INR
INR: 2 — ABNORMAL HIGH (ref 0.8–1.2)
Prothrombin Time: 23.2 s — ABNORMAL HIGH (ref 11.4–15.2)

## 2024-09-30 LAB — FERRITIN: Ferritin: 17 ng/mL (ref 11–307)

## 2024-09-30 NOTE — Progress Notes (Signed)
°  [facility] OFFICE PROGRESS NOTE   Diagnosis: ITP, CVA  INTERVAL HISTORY:   Carol Thomas returns as scheduled.  She continues Coumadin .  She denies bleeding.  No symptom of thrombosis.  She wonders if there is a different blood thinner she could take that would not require frequent monitoring.  Objective:  Vital signs in last 24 hours:  Blood pressure (!) 143/79, pulse 65, temperature 97.8 F (36.6 C), temperature source Temporal, resp. rate 18, height 5' 5 (1.651 m), weight 182 lb 9.6 oz (82.8 kg), SpO2 96%.    HEENT: No thrush or ulcers.  No bleeding in the mouth. Resp: Lungs clear bilaterally. Cardio: Regular rate and rhythm. GI: No hepatosplenomegaly.  Nontender. Vascular: No leg edema.   Lab Results:  Lab Results  Component Value Date   WBC 4.7 09/30/2024   HGB 11.3 (L) 09/30/2024   HCT 35.3 (L) 09/30/2024   MCV 74.2 (L) 09/30/2024   PLT 104 (L) 09/30/2024   NEUTROABS 3.0 09/30/2024    Imaging:  No results found.  Medications: I have reviewed the patient's current medications.  Assessment/Plan: Thrombocytopenia-progressive 09/20/2022 Admission 09/20/2022 with severe thrombocytopenia, prednisone  09/20/2022, Solu-Medrol  1 mg/kilogram daily start 09/20/2022 Bone marrow biopsy 09/21/2022-hypercellular marrow for age, increased megakaryocytes, no evidence of a lymphoproliferative disorder Prednisone  80 mg daily 09/22/2022 Prednisone  40 mg daily 09/26/2022 Prednisone  30 mg daily 10/04/2022 Prednisone  20 mg daily 11/01/2022 Prednisone  15 mg daily 11/23/2022 Prednisone  10 mg daily 01/03/2023 Prednisone  5 mg daily 01/26/2023 Prednisone  2.5 mg daily 02/22/2023 Prednisone  5 mg daily 03/14/2023 Prednisone  5 alternating with 2.5 mg daily 04/03/2023 Prednisone  40 mg daily X 3, then 20 mg daily start 05/24/2023 Prednisone  decreased to 10 mg daily 06/27/2023 Prednisone  decreased to 10 mg alternating with 5 mg daily 07/04/2023 Cycle 1 rituximab  07/04/2023 Cycle 2  rituximab  07/11/2023 Cycle 3 rituximab  07/18/2023 Prednisone  decreased to 5 mg daily 07/25/2023 Cycle 4 rituximab  07/25/2023 Prednisone  decreased to 5 mg every other day 08/15/2023 Prednisone  discontinued 09/05/2023 Leukopenia History of melanoma located left leg below the knee status post post excision 1998-completed an immunotherapy trial at [facility] Eosinophilic esophagitis Hypertension Anxiety History of colon polyps Hypercholesterolemia  Elevated IgM level Polyclonal hypergammaglobulinemia 02/02/2024: Left cerebellar and left occipital CVAs-aspirin , aspirin  placed on hold when she started Coumadin  anticoagulation Positive lupus anticoagulant 05/08/2024; 05/13/2024 beta-2  glycoprotein IgG/IgM/IgA normal, cardiolipin antibodies IgG and IgA normal, IgM indeterminate range; positive lupus anticoagulant panel 08/05/2024; positive lupus anticoagulant panel 09/02/2024, anticardiolipin IgM indeterminate range, beta-2  glycoprotein IgG/IgM/IgA normal Iron deficiency anemia-capsule endoscopy 08/26/2024 with no evidence of recent or active bleeding in the small bowel.      Disposition: Carol Thomas appears stable.  We reviewed the PT/INR and CBC from today.  The PT/INR is in goal range.  She will continue Coumadin  at the current dose.  Platelet count is adequate.  She understands to contact the office with bleeding.  We reviewed the positive lupus anticoagulant panel result from 08/05/2024 and 09/02/2024.  She understands the recommendation to continue indefinite Coumadin  anticoagulation.  We discussed a referral to the [facility] thrombosis center.  She would like to proceed with this.  Referral placed today.  She will return for lab and follow-up in 4 weeks.  We are available to see her sooner if needed.  Plan reviewed with Dr. Cloretta.  Olam Ned ANP/GNP-BC   09/30/2024  10:43 AM

## 2024-09-30 NOTE — Progress Notes (Signed)
 Referral faxed to [facility] at [phone]

## 2024-10-04 ENCOUNTER — Other Ambulatory Visit: Payer: Self-pay | Admitting: Family Medicine

## 2024-10-04 DIAGNOSIS — M85852 Other specified disorders of bone density and structure, left thigh: Secondary | ICD-10-CM

## 2024-10-04 DIAGNOSIS — Z9189 Other specified personal risk factors, not elsewhere classified: Secondary | ICD-10-CM

## 2024-10-06 ENCOUNTER — Other Ambulatory Visit: Payer: Self-pay | Admitting: Family Medicine

## 2024-10-06 DIAGNOSIS — I10 Essential (primary) hypertension: Secondary | ICD-10-CM

## 2024-10-15 ENCOUNTER — Other Ambulatory Visit: Payer: Self-pay

## 2024-10-15 ENCOUNTER — Telehealth: Payer: Self-pay | Admitting: Family Medicine

## 2024-10-15 DIAGNOSIS — E7800 Pure hypercholesterolemia, unspecified: Secondary | ICD-10-CM

## 2024-10-15 MED ORDER — ROSUVASTATIN CALCIUM 40 MG PO TABS: 40.0000 mg | ORAL_TABLET | Freq: Every day | ORAL | 0 refills | Status: DC

## 2024-10-15 NOTE — Telephone Encounter (Signed)
 Patient switching Rosuvastatin  40 to [facility]

## 2024-10-15 NOTE — Telephone Encounter (Signed)
 [facility]   Rosuvastatin  40

## 2024-10-17 ENCOUNTER — Encounter: Payer: Self-pay | Admitting: Oncology

## 2024-10-19 ENCOUNTER — Other Ambulatory Visit: Payer: Self-pay | Admitting: Medical

## 2024-10-20 ENCOUNTER — Telehealth: Payer: Self-pay

## 2024-10-20 NOTE — Telephone Encounter (Signed)
 Contacted patient in regards to mychart message being left by patient on 10/17/24 with complaints of a headache on the left side. Patient stated that headache has resolved, just wanted clarification.Made aware that Tylenol  1,000 mgs PO every 6 hours can be taken for mild pain, fever or headache per Medication Reconciliation, understood, no further questions.

## 2024-11-06 ENCOUNTER — Inpatient Hospital Stay: Admitting: Oncology

## 2024-11-06 ENCOUNTER — Inpatient Hospital Stay: Attending: Oncology

## 2024-11-06 ENCOUNTER — Telehealth: Payer: Self-pay | Admitting: *Deleted

## 2024-11-06 VITALS — BP 134/85 | HR 86 | Temp 97.8°F | Resp 18 | Ht 65.0 in | Wt 177.5 lb

## 2024-11-06 DIAGNOSIS — D696 Thrombocytopenia, unspecified: Secondary | ICD-10-CM

## 2024-11-06 DIAGNOSIS — D693 Immune thrombocytopenic purpura: Secondary | ICD-10-CM | POA: Diagnosis present

## 2024-11-06 DIAGNOSIS — Z85820 Personal history of malignant melanoma of skin: Secondary | ICD-10-CM | POA: Insufficient documentation

## 2024-11-06 DIAGNOSIS — D6861 Antiphospholipid syndrome: Secondary | ICD-10-CM | POA: Diagnosis present

## 2024-11-06 DIAGNOSIS — Z7901 Long term (current) use of anticoagulants: Secondary | ICD-10-CM

## 2024-11-06 DIAGNOSIS — Z860100 Personal history of colon polyps, unspecified: Secondary | ICD-10-CM | POA: Insufficient documentation

## 2024-11-06 DIAGNOSIS — R7689 Other specified abnormal immunological findings in serum: Secondary | ICD-10-CM | POA: Insufficient documentation

## 2024-11-06 LAB — CBC WITH DIFFERENTIAL (CANCER CENTER ONLY)
Abs Immature Granulocytes: 0.02 K/uL (ref 0.00–0.07)
Basophils Absolute: 0 K/uL (ref 0.0–0.1)
Basophils Relative: 1 %
Eosinophils Absolute: 0.1 K/uL (ref 0.0–0.5)
Eosinophils Relative: 3 %
HCT: 35.5 % — ABNORMAL LOW (ref 36.0–46.0)
Hemoglobin: 11.1 g/dL — ABNORMAL LOW (ref 12.0–15.0)
Immature Granulocytes: 1 %
Lymphocytes Relative: 26 %
Lymphs Abs: 0.9 K/uL (ref 0.7–4.0)
MCH: 23.6 pg — ABNORMAL LOW (ref 26.0–34.0)
MCHC: 31.3 g/dL (ref 30.0–36.0)
MCV: 75.4 fL — ABNORMAL LOW (ref 80.0–100.0)
Monocytes Absolute: 0.2 K/uL (ref 0.1–1.0)
Monocytes Relative: 7 %
Neutro Abs: 2.2 K/uL (ref 1.7–7.7)
Neutrophils Relative %: 62 %
Platelet Count: 101 K/uL — ABNORMAL LOW (ref 150–400)
RBC: 4.71 MIL/uL (ref 3.87–5.11)
RDW: 18.2 % — ABNORMAL HIGH (ref 11.5–15.5)
WBC Count: 3.4 K/uL — ABNORMAL LOW (ref 4.0–10.5)
nRBC: 0 % (ref 0.0–0.2)

## 2024-11-06 LAB — PROTIME-INR
INR: 2 — ABNORMAL HIGH (ref 0.8–1.2)
Prothrombin Time: 23.7 s — ABNORMAL HIGH (ref 11.4–15.2)

## 2024-11-06 NOTE — Telephone Encounter (Signed)
 Notified that INR is 2.0 and per Dr. Cloretta continue warfarin at 5 mg MWF and 2.5 mg all other days. F/U as scheduled.

## 2024-11-06 NOTE — Progress Notes (Signed)
 Need to clarify w/MD

## 2024-11-06 NOTE — Progress Notes (Signed)
 " [facility] OFFICE PROGRESS NOTE   Diagnosis: ITP, antiphospholipid syndrome  INTERVAL HISTORY:   Ms. Cappucci returns as scheduled.  She continues Coumadin .  No bleeding or symptom of thrombosis.  She is exercising.  Objective:  Vital signs in last 24 hours:  Blood pressure 134/85, pulse 86, temperature 97.8 F (36.6 C), temperature source Temporal, resp. rate 18, height 5' 5 (1.651 m), weight 177 lb 8 oz (80.5 kg), SpO2 100%.  Resp: Lungs clear bilaterally Cardio: Regular rate and rhythm GI: No hepatomegaly Vascular: No leg edema  Skin: Small ecchymosis at the right forearm  Portacath/PICC-without erythema  Lab Results:  Lab Results  Component Value Date   WBC 3.4 (L) 11/06/2024   HGB 11.1 (L) 11/06/2024   HCT 35.5 (L) 11/06/2024   MCV 75.4 (L) 11/06/2024   PLT 101 (L) 11/06/2024   NEUTROABS 2.2 11/06/2024    CMP  Lab Results  Component Value Date   NA 138 09/10/2024   K 4.8 09/10/2024   CL 103 09/10/2024   CO2 21 09/10/2024   GLUCOSE 91 09/10/2024   BUN 15 09/10/2024   CREATININE 1.04 (H) 09/10/2024   CALCIUM  9.6 09/10/2024   PROT 6.9 09/10/2024   ALBUMIN 4.4 09/10/2024   AST 33 09/10/2024   ALT 21 09/10/2024   ALKPHOS 70 09/10/2024   BILITOT 0.6 09/10/2024   GFRNONAA >60 02/03/2024   GFRAA 72 07/28/2020    Medications: I have reviewed the patient's current medications.   Assessment/Plan: Thrombocytopenia-progressive 09/20/2022 Admission 09/20/2022 with severe thrombocytopenia, prednisone  09/20/2022, Solu-Medrol  1 mg/kilogram daily start 09/20/2022 Bone marrow biopsy 09/21/2022-hypercellular marrow for age, increased megakaryocytes, no evidence of a lymphoproliferative disorder Prednisone  80 mg daily 09/22/2022 Prednisone  40 mg daily 09/26/2022 Prednisone  30 mg daily 10/04/2022 Prednisone  20 mg daily 11/01/2022 Prednisone  15 mg daily 11/23/2022 Prednisone  10 mg daily 01/03/2023 Prednisone  5 mg daily 01/26/2023 Prednisone  2.5 mg daily  02/22/2023 Prednisone  5 mg daily 03/14/2023 Prednisone  5 alternating with 2.5 mg daily 04/03/2023 Prednisone  40 mg daily X 3, then 20 mg daily start 05/24/2023 Prednisone  decreased to 10 mg daily 06/27/2023 Prednisone  decreased to 10 mg alternating with 5 mg daily 07/04/2023 Cycle 1 rituximab  07/04/2023 Cycle 2 rituximab  07/11/2023 Cycle 3 rituximab  07/18/2023 Prednisone  decreased to 5 mg daily 07/25/2023 Cycle 4 rituximab  07/25/2023 Prednisone  decreased to 5 mg every other day 08/15/2023 Prednisone  discontinued 09/05/2023 Leukopenia History of melanoma located left leg below the knee status post post excision 1998-completed an immunotherapy trial at [facility] Eosinophilic esophagitis Hypertension Anxiety History of colon polyps Hypercholesterolemia  Elevated IgM level Polyclonal hypergammaglobulinemia 02/02/2024: Left cerebellar and left occipital CVAs-aspirin , aspirin  placed on hold when she started Coumadin  anticoagulation Positive lupus anticoagulant 05/08/2024; 05/13/2024 beta-2  glycoprotein IgG/IgM/IgA normal, cardiolipin antibodies IgG and IgA normal, IgM indeterminate range; positive lupus anticoagulant panel 08/05/2024; positive lupus anticoagulant panel 09/02/2024, anticardiolipin IgM indeterminate range, beta-2  glycoprotein IgG/IgM/IgA normal Iron deficiency anemia-capsule endoscopy 08/26/2024 with no evidence of recent or active bleeding in the small bowel.       Disposition: Ms. Barman appears stable.  She has stable mild thrombocytopenia.  She has been off of prednisone  since November 2024.  She continues Coumadin  anticoagulation.  She will return to the lab for a PT/INR today.  She has persistent microcytic anemia.  She has a history of iron deficiency anemia.  She will resume iron therapy.  She will begin a stool softener while on iron.  Ms. Kok will return for an office and lab visit in 1 month.  Arley Hof, MD  11/06/2024  11:10 AM   "

## 2024-11-07 ENCOUNTER — Ambulatory Visit: Admitting: Oncology

## 2024-11-07 ENCOUNTER — Other Ambulatory Visit

## 2024-11-15 ENCOUNTER — Other Ambulatory Visit: Payer: Self-pay | Admitting: Family Medicine

## 2024-11-15 DIAGNOSIS — E7800 Pure hypercholesterolemia, unspecified: Secondary | ICD-10-CM

## 2024-11-20 ENCOUNTER — Ambulatory Visit: Admitting: Neurology

## 2024-12-11 ENCOUNTER — Inpatient Hospital Stay

## 2024-12-11 ENCOUNTER — Inpatient Hospital Stay: Admitting: Oncology

## 2025-02-25 ENCOUNTER — Ambulatory Visit: Admitting: Neurology

## 2025-03-12 ENCOUNTER — Ambulatory Visit: Admitting: Family Medicine

## 2025-10-07 ENCOUNTER — Encounter: Admitting: Family Medicine
